# Patient Record
Sex: Female | Born: 1940
Health system: Southern US, Community
[De-identification: ages and names within clinical notes are randomized; demographics above are authoritative.]

## PROBLEM LIST (undated history)

## (undated) DIAGNOSIS — R194 Change in bowel habit: Secondary | ICD-10-CM

## (undated) DIAGNOSIS — K759 Inflammatory liver disease, unspecified: Secondary | ICD-10-CM

## (undated) DIAGNOSIS — G4730 Sleep apnea, unspecified: Secondary | ICD-10-CM

## (undated) DIAGNOSIS — C801 Malignant (primary) neoplasm, unspecified: Secondary | ICD-10-CM

## (undated) DIAGNOSIS — C50919 Malignant neoplasm of unspecified site of unspecified female breast: Secondary | ICD-10-CM

## (undated) DIAGNOSIS — I639 Cerebral infarction, unspecified: Secondary | ICD-10-CM

## (undated) DIAGNOSIS — E039 Hypothyroidism, unspecified: Secondary | ICD-10-CM

## (undated) DIAGNOSIS — M1990 Unspecified osteoarthritis, unspecified site: Secondary | ICD-10-CM

## (undated) DIAGNOSIS — Z780 Asymptomatic menopausal state: Secondary | ICD-10-CM

## (undated) DIAGNOSIS — J40 Bronchitis, not specified as acute or chronic: Secondary | ICD-10-CM

## (undated) DIAGNOSIS — S7292XA Unspecified fracture of left femur, initial encounter for closed fracture: Secondary | ICD-10-CM

## (undated) DIAGNOSIS — E785 Hyperlipidemia, unspecified: Secondary | ICD-10-CM

## (undated) DIAGNOSIS — M810 Age-related osteoporosis without current pathological fracture: Secondary | ICD-10-CM

## (undated) DIAGNOSIS — D472 Monoclonal gammopathy: Secondary | ICD-10-CM

## (undated) DIAGNOSIS — I10 Essential (primary) hypertension: Secondary | ICD-10-CM

## (undated) DIAGNOSIS — K625 Hemorrhage of anus and rectum: Secondary | ICD-10-CM

## (undated) DIAGNOSIS — E559 Vitamin D deficiency, unspecified: Secondary | ICD-10-CM

## (undated) DIAGNOSIS — K635 Polyp of colon: Secondary | ICD-10-CM

## (undated) DIAGNOSIS — J302 Other seasonal allergic rhinitis: Secondary | ICD-10-CM

## (undated) DIAGNOSIS — Z923 Personal history of irradiation: Secondary | ICD-10-CM

## (undated) HISTORY — DX: Cerebral infarction, unspecified: I63.9

## (undated) HISTORY — DX: Monoclonal gammopathy: D47.2

## (undated) HISTORY — DX: Unspecified fracture of left femur, initial encounter for closed fracture: S72.92XA

## (undated) HISTORY — PX: BREAST CYST ASPIRATION: SHX578

## (undated) HISTORY — PX: FRACTURE SURGERY: SHX138

## (undated) HISTORY — PX: EYE SURGERY: SHX253

## (undated) HISTORY — PX: OTHER SURGICAL HISTORY: SHX169

## (undated) HISTORY — PX: TONSILLECTOMY: SUR1361

## (undated) HISTORY — DX: Malignant neoplasm of unspecified site of unspecified female breast: C50.919

## (undated) HISTORY — DX: Essential (primary) hypertension: I10

## (undated) HISTORY — DX: Asymptomatic menopausal state: Z78.0

---

## 1998-03-27 ENCOUNTER — Other Ambulatory Visit: Admission: RE | Admit: 1998-03-27 | Discharge: 1998-03-27 | Payer: Self-pay | Admitting: *Deleted

## 1999-02-18 ENCOUNTER — Ambulatory Visit (HOSPITAL_COMMUNITY): Admission: RE | Admit: 1999-02-18 | Discharge: 1999-02-18 | Payer: Self-pay | Admitting: Obstetrics & Gynecology

## 1999-04-01 ENCOUNTER — Other Ambulatory Visit: Admission: RE | Admit: 1999-04-01 | Discharge: 1999-04-01 | Payer: Self-pay | Admitting: *Deleted

## 1999-06-18 ENCOUNTER — Encounter: Admission: RE | Admit: 1999-06-18 | Discharge: 1999-06-18 | Payer: Self-pay | Admitting: *Deleted

## 1999-06-18 ENCOUNTER — Encounter: Payer: Self-pay | Admitting: *Deleted

## 1999-12-29 ENCOUNTER — Encounter: Admission: RE | Admit: 1999-12-29 | Discharge: 1999-12-29 | Payer: Self-pay | Admitting: Endocrinology

## 2000-01-04 ENCOUNTER — Encounter: Admission: RE | Admit: 2000-01-04 | Discharge: 2000-01-04 | Payer: Self-pay

## 2001-02-22 ENCOUNTER — Encounter: Admission: RE | Admit: 2001-02-22 | Discharge: 2001-02-22 | Payer: Self-pay | Admitting: Endocrinology

## 2001-02-27 ENCOUNTER — Encounter: Admission: RE | Admit: 2001-02-27 | Discharge: 2001-02-27 | Payer: Self-pay | Admitting: Endocrinology

## 2002-04-10 ENCOUNTER — Encounter: Admission: RE | Admit: 2002-04-10 | Discharge: 2002-04-10 | Payer: Self-pay | Admitting: Endocrinology

## 2005-11-30 ENCOUNTER — Ambulatory Visit: Payer: Self-pay | Admitting: Family Medicine

## 2006-01-11 ENCOUNTER — Ambulatory Visit: Payer: Self-pay | Admitting: Unknown Physician Specialty

## 2007-03-20 ENCOUNTER — Ambulatory Visit: Payer: Self-pay | Admitting: Family Medicine

## 2008-03-27 ENCOUNTER — Ambulatory Visit: Payer: Self-pay | Admitting: Family Medicine

## 2008-04-02 ENCOUNTER — Ambulatory Visit: Payer: Self-pay | Admitting: Family Medicine

## 2008-09-30 HISTORY — PX: BREAST BIOPSY: SHX20

## 2009-04-02 ENCOUNTER — Ambulatory Visit: Payer: Self-pay | Admitting: General Surgery

## 2010-09-22 ENCOUNTER — Ambulatory Visit: Payer: Self-pay | Admitting: Family Medicine

## 2011-02-15 ENCOUNTER — Ambulatory Visit: Payer: Self-pay | Admitting: Unknown Physician Specialty

## 2011-09-29 ENCOUNTER — Emergency Department: Payer: Self-pay | Admitting: Emergency Medicine

## 2011-09-29 LAB — CBC WITH DIFFERENTIAL/PLATELET
Basophil #: 0 10*3/uL (ref 0.0–0.1)
Basophil %: 0 %
Eosinophil #: 0 10*3/uL (ref 0.0–0.7)
Eosinophil %: 0 %
HCT: 41.1 % (ref 35.0–47.0)
HGB: 14 g/dL (ref 12.0–16.0)
Lymphocyte #: 0.4 10*3/uL — ABNORMAL LOW (ref 1.0–3.6)
Lymphocyte %: 3.5 %
MCH: 30.4 pg (ref 26.0–34.0)
MCHC: 34 g/dL (ref 32.0–36.0)
MCV: 89 fL (ref 80–100)
Monocyte #: 0.5 10*3/uL (ref 0.0–0.7)
Monocyte %: 3.9 %
Neutrophil #: 11.6 10*3/uL — ABNORMAL HIGH (ref 1.4–6.5)
Neutrophil %: 92.6 %
Platelet: 217 10*3/uL (ref 150–440)
RBC: 4.6 10*6/uL (ref 3.80–5.20)
RDW: 13.3 % (ref 11.5–14.5)
WBC: 12.6 10*3/uL — ABNORMAL HIGH (ref 3.6–11.0)

## 2011-09-29 LAB — URINALYSIS, COMPLETE
Bilirubin,UR: NEGATIVE
Blood: NEGATIVE
Glucose,UR: NEGATIVE mg/dL (ref 0–75)
Leukocyte Esterase: NEGATIVE
Nitrite: NEGATIVE
Ph: 6 (ref 4.5–8.0)
Protein: NEGATIVE
RBC,UR: 2 /HPF (ref 0–5)
Specific Gravity: 1.024 (ref 1.003–1.030)
Squamous Epithelial: 2
WBC UR: 1 /HPF (ref 0–5)

## 2011-09-29 LAB — COMPREHENSIVE METABOLIC PANEL
Albumin: 4.1 g/dL (ref 3.4–5.0)
Alkaline Phosphatase: 76 U/L (ref 50–136)
Anion Gap: 16 (ref 7–16)
BUN: 19 mg/dL — ABNORMAL HIGH (ref 7–18)
Bilirubin,Total: 0.8 mg/dL (ref 0.2–1.0)
Calcium, Total: 10.1 mg/dL (ref 8.5–10.1)
Chloride: 100 mmol/L (ref 98–107)
Co2: 25 mmol/L (ref 21–32)
Creatinine: 0.79 mg/dL (ref 0.60–1.30)
EGFR (African American): >60
EGFR (Non-African Amer.): >60
Glucose: 172 mg/dL — ABNORMAL HIGH (ref 65–99)
Osmolality: 288 (ref 275–301)
Potassium: 3.9 mmol/L (ref 3.5–5.1)
SGOT(AST): 24 U/L (ref 15–37)
SGPT (ALT): 12 U/L
Sodium: 141 mmol/L (ref 136–145)
Total Protein: 8.5 g/dL — ABNORMAL HIGH (ref 6.4–8.2)

## 2011-10-18 ENCOUNTER — Ambulatory Visit: Payer: Self-pay | Admitting: Family Medicine

## 2012-12-25 ENCOUNTER — Ambulatory Visit: Payer: Self-pay | Admitting: Family Medicine

## 2013-09-11 ENCOUNTER — Ambulatory Visit: Payer: Self-pay

## 2014-01-21 ENCOUNTER — Ambulatory Visit: Payer: Self-pay | Admitting: Family Medicine

## 2014-09-10 DIAGNOSIS — B001 Herpesviral vesicular dermatitis: Secondary | ICD-10-CM | POA: Diagnosis not present

## 2014-09-10 DIAGNOSIS — L821 Other seborrheic keratosis: Secondary | ICD-10-CM | POA: Diagnosis not present

## 2014-09-10 DIAGNOSIS — Z1283 Encounter for screening for malignant neoplasm of skin: Secondary | ICD-10-CM | POA: Diagnosis not present

## 2014-09-10 DIAGNOSIS — L218 Other seborrheic dermatitis: Secondary | ICD-10-CM | POA: Diagnosis not present

## 2014-12-26 DIAGNOSIS — K529 Noninfective gastroenteritis and colitis, unspecified: Secondary | ICD-10-CM | POA: Diagnosis not present

## 2014-12-31 ENCOUNTER — Encounter: Payer: Self-pay | Admitting: *Deleted

## 2014-12-31 ENCOUNTER — Emergency Department
Admission: EM | Admit: 2014-12-31 | Discharge: 2014-12-31 | Disposition: A | Discharge status: Home / Self Care | Payer: Commercial Managed Care - HMO | Attending: Emergency Medicine | Admitting: Emergency Medicine

## 2014-12-31 DIAGNOSIS — K529 Noninfective gastroenteritis and colitis, unspecified: Secondary | ICD-10-CM | POA: Diagnosis not present

## 2014-12-31 DIAGNOSIS — R197 Diarrhea, unspecified: Secondary | ICD-10-CM | POA: Diagnosis present

## 2014-12-31 DIAGNOSIS — K644 Residual hemorrhoidal skin tags: Secondary | ICD-10-CM | POA: Diagnosis not present

## 2014-12-31 LAB — COMPREHENSIVE METABOLIC PANEL
ALT: 13 U/L — ABNORMAL LOW (ref 14–54)
ANION GAP: 7 (ref 5–15)
AST: 22 U/L (ref 15–41)
Albumin: 4.1 g/dL (ref 3.5–5.0)
Alkaline Phosphatase: 75 U/L (ref 38–126)
BILIRUBIN TOTAL: 0.7 mg/dL (ref 0.3–1.2)
BUN: 18 mg/dL (ref 6–20)
CALCIUM: 9.6 mg/dL (ref 8.9–10.3)
CO2: 29 mmol/L (ref 22–32)
CREATININE: 0.65 mg/dL (ref 0.44–1.00)
Chloride: 107 mmol/L (ref 101–111)
GFR calc non Af Amer: >60 mL/min (ref >60)
Glucose, Bld: 113 mg/dL — ABNORMAL HIGH (ref 65–99)
Potassium: 4 mmol/L (ref 3.5–5.1)
Sodium: 143 mmol/L (ref 135–145)
TOTAL PROTEIN: 7.8 g/dL (ref 6.5–8.1)

## 2014-12-31 LAB — CBC WITH DIFFERENTIAL/PLATELET
BASOS ABS: 0.1 10*3/uL (ref 0–0.1)
BASOS PCT: 1 %
EOS ABS: 0.1 10*3/uL (ref 0–0.7)
Eosinophils Relative: 2 %
HCT: 39.3 % (ref 35.0–47.0)
Hemoglobin: 13.2 g/dL (ref 12.0–16.0)
Lymphocytes Relative: 23 %
Lymphs Abs: 1.5 10*3/uL (ref 1.0–3.6)
MCH: 29.9 pg (ref 26.0–34.0)
MCHC: 33.6 g/dL (ref 32.0–36.0)
MCV: 89.1 fL (ref 80.0–100.0)
Monocytes Absolute: 0.5 10*3/uL (ref 0.2–0.9)
Monocytes Relative: 9 %
Neutro Abs: 4.2 10*3/uL (ref 1.4–6.5)
Neutrophils Relative %: 65 %
PLATELETS: 189 10*3/uL (ref 150–440)
RBC: 4.41 MIL/uL (ref 3.80–5.20)
RDW: 14.1 % (ref 11.5–14.5)
WBC: 6.4 10*3/uL (ref 3.6–11.0)

## 2014-12-31 LAB — LIPASE, BLOOD: Lipase: 28 U/L (ref 22–51)

## 2014-12-31 MED ORDER — DICYCLOMINE HCL 20 MG PO TABS: 20.0000 mg | ORAL_TABLET | Freq: Three times a day (TID) | ORAL | Status: DC | PRN

## 2014-12-31 MED ORDER — TUCKS 50 % EX PADS: 1.0000 "application " | MEDICATED_PAD | Freq: Two times a day (BID) | CUTANEOUS | Status: DC

## 2014-12-31 NOTE — ED Provider Notes (Signed)
[facility] Emergency Department Provider Note  ____________________________________________  Time seen: 11:10 PM  I have reviewed the triage vital signs and the nursing notes.   HISTORY  Chief Complaint Diarrhea    HPI Kristin Wise is a 74 y.o. female who complains of chronic diarrhea for about the last 9 months. She reports that this started after falling in the river during a canoe trip. It is waxing and waning, and she can go several days without having any diarrhea. The diarrhea always happens right after eating. She has not noticed any particular triggers. She does not have any vomiting or fever. No chest pain or shortness of breath. Other than some mild intermittent nausea, and some occasional dizziness which she says is not lightheadedness or presyncope, she has no other symptoms.She comes to the ED tonight because she had one episode of bloody diarrhea at home at about 7 PM today. She has not had any other bowel movements since then. It was noted to be a small amount of red blood.     History reviewed. No pertinent past medical history.  There are no active problems to display for this patient.   Past Surgical History  Procedure Laterality Date  . Tonsillectomy      Current Outpatient Rx  Name  Route  Sig  Dispense  Refill  . dicyclomine (BENTYL) 20 MG tablet   Oral   Take 1 tablet (20 mg total) by mouth 3 (three) times daily as needed for spasms.   30 tablet   0   . Witch Hazel (TUCKS) 50 % PADS   Apply externally   Apply 1 application topically 2 (two) times daily.   60 each   0     Allergies Sulfa antibiotics  No family history on file.  Social History History  Substance Use Topics  . Smoking status: Never Smoker   . Smokeless tobacco: Not on file  . Alcohol Use: No    Review of Systems  Constitutional: No fever or chills. No weight changes Eyes:No blurry vision or double vision.  ENT: No sore  throat. Cardiovascular: No chest pain. Respiratory: No dyspnea or cough. Gastrointestinal: Negative for abdominal pain, vomiting. No BRBPR or melena. Genitourinary: Negative for dysuria, urinary retention, bloody urine, or difficulty urinating. Musculoskeletal: Negative for back pain. No joint swelling or pain. Skin: Negative for rash. Neurological: Negative for headaches, focal weakness or numbness. Psychiatric:No anxiety or depression.   Endocrine:No hot/cold intolerance, changes in energy, or sleep difficulty.  10-point ROS otherwise negative.  ____________________________________________   PHYSICAL EXAM:  VITAL SIGNS: ED Triage Vitals  Enc Vitals Group     BP 12/31/14 2115 153/75 mmHg     Pulse Rate 12/31/14 2115 73     Resp 12/31/14 2220 9     Temp 12/31/14 2115 98.2 F (36.8 C)     Temp Source 12/31/14 2115 Oral     SpO2 12/31/14 2115 99 %     Weight 12/31/14 2115 150 lb (68.04 kg)     Height 12/31/14 2115 5\' 3"  (1.6 m)     Head Cir --      Peak Flow --      Pain Score 12/31/14 2116 3     Pain Loc --      Pain Edu? --      Excl. in [place]? --      Constitutional: Alert and oriented. Well appearing and in no distress. Eyes: No scleral icterus. No conjunctival pallor. PERRL.  EOMI ENT   Head: Normocephalic and atraumatic.   Nose: No congestion/rhinnorhea. No septal hematoma   Mouth/Throat: MMM, no pharyngeal erythema   Neck: No stridor. No SubQ emphysema.  Hematological/Lymphatic/Immunilogical: No cervical lymphadenopathy. Cardiovascular: RRR. Normal and symmetric distal pulses are present in all extremities. No murmurs, rubs, or gallops. Respiratory: Normal respiratory effort without tachypnea nor retractions. Breath sounds are clear and equal bilaterally. No wheezes/rales/rhonchi. Gastrointestinal: Soft and nontender. No distention. There is no CVA tenderness.  No rebound, rigidity, or guarding. Rectal exam reveals several small external hemorrhoids,  with some areas of mucosal breakdown. Genitourinary: deferred Musculoskeletal: Nontender with normal range of motion in all extremities. No joint effusions.  No lower extremity tenderness.  No edema. Neurologic:   Normal speech and language.  CN 2-10 normal. Motor grossly intact. No pronator drift.  Normal gait. No gross focal neurologic deficits are appreciated.  Skin:  Skin is warm, dry and intact. No rash noted.  No petechiae, purpura, or bullae. Psychiatric: Mood and affect are normal. Speech and behavior are normal. Patient exhibits appropriate insight and judgment.  ____________________________________________    LABS (pertinent positives/negatives)  Results for orders placed or performed during the hospital encounter of 12/31/14  CBC WITH DIFFERENTIAL  Result Value Ref Range   WBC 6.4 3.6 - 11.0 K/uL   RBC 4.41 3.80 - 5.20 MIL/uL   Hemoglobin 13.2 12.0 - 16.0 g/dL   HCT 39.3 35.0 - 47.0 %   MCV 89.1 80.0 - 100.0 fL   MCH 29.9 26.0 - 34.0 pg   MCHC 33.6 32.0 - 36.0 g/dL   RDW 14.1 11.5 - 14.5 %   Platelets 189 150 - 440 K/uL   Neutrophils Relative % 65 %   Neutro Abs 4.2 1.4 - 6.5 K/uL   Lymphocytes Relative 23 %   Lymphs Abs 1.5 1.0 - 3.6 K/uL   Monocytes Relative 9 %   Monocytes Absolute 0.5 0.2 - 0.9 K/uL   Eosinophils Relative 2 %   Eosinophils Absolute 0.1 0 - 0.7 K/uL   Basophils Relative 1 %   Basophils Absolute 0.1 0 - 0.1 K/uL  Comprehensive metabolic panel  Result Value Ref Range   Sodium 143 135 - 145 mmol/L   Potassium 4.0 3.5 - 5.1 mmol/L   Chloride 107 101 - 111 mmol/L   CO2 29 22 - 32 mmol/L   Glucose, Bld 113 (H) 65 - 99 mg/dL   BUN 18 6 - 20 mg/dL   Creatinine, Ser 0.65 0.44 - 1.00 mg/dL   Calcium 9.6 8.9 - 10.3 mg/dL   Total Protein 7.8 6.5 - 8.1 g/dL   Albumin 4.1 3.5 - 5.0 g/dL   AST 22 15 - 41 U/L   ALT 13 (L) 14 - 54 U/L   Alkaline Phosphatase 75 38 - 126 U/L   Total Bilirubin 0.7 0.3 - 1.2 mg/dL   GFR calc non Af Amer >60 >60 mL/min    GFR calc Af Amer >60 >60 mL/min   Anion gap 7 5 - 15  Lipase, blood  Result Value Ref Range   Lipase 28 22 - 51 U/L     ____________________________________________   EKG    ____________________________________________    RADIOLOGY    ____________________________________________   PROCEDURES  ____________________________________________   INITIAL IMPRESSION / ASSESSMENT AND PLAN / ED COURSE  Pertinent labs & imaging results that were available during my care of the patient were reviewed by me and considered in my medical decision making (see chart  for details).  The patient presents with chronic diarrhea, which may be related to a parasitic disease, IBD, food sensitivity. She does not appear to be having an active GI bleed at this time. I will suspicion for diverticulitis or surgical abdomen. The patient is in no acute distress, medically stable. She'll follow up with her primary care doctor who has an ongoing workup for this in process. She'll also return to her GI clinic with Dr. Vira Agar, she has she has a past.  ____________________________________________   FINAL CLINICAL IMPRESSION(S) / ED DIAGNOSES  Final diagnoses:  Chronic diarrhea of unknown origin  External hemorrhoids      Carrie Mew, MD 12/31/14 2333

## 2014-12-31 NOTE — ED Notes (Signed)
Pt unable to give urine sample at this time 

## 2014-12-31 NOTE — ED Notes (Addendum)
edp with pt for bedside eval at this time. rn at bedside for chaperone for rectal exam

## 2014-12-31 NOTE — Discharge Instructions (Signed)
Chronic Diarrhea Diarrhea is frequent loose and watery bowel movements. It can cause you to feel weak and dehydrated. Dehydration can cause you to become tired and thirsty and to have a dry mouth, decreased urination, and dark yellow urine. Diarrhea is a sign of another problem, most often an infection that will not last long. In most cases, diarrhea lasts 2-3 days. Diarrhea that lasts longer than 4 weeks is called long-lasting (chronic) diarrhea. It is important to treat your diarrhea as directed by your health care provider to lessen or prevent future episodes of diarrhea.  CAUSES  There are many causes of chronic diarrhea. The following are some possible causes:   Gastrointestinal infections caused by viruses, bacteria, or parasites.   Food poisoning or food allergies.   Certain medicines, such as antibiotics, chemotherapy, and laxatives.   Artificial sweeteners and fructose.   Digestive disorders, such as celiac disease and inflammatory bowel diseases.   Irritable bowel syndrome.  Some disorders of the pancreas.  Disorders of the thyroid.  Reduced blood flow to the intestines.  Cancer. Sometimes the cause of chronic diarrhea is unknown. RISK FACTORS  Having a severely weakened immune system, such as from HIV or AIDS.   Taking certain types of cancer-fighting drugs (such as with chemotherapy) or other medicines.   Having had a recent organ transplant.   Having a portion of the stomach or small bowel removed.   Traveling to countries where food and water supplies are often contaminated.  SYMPTOMS  In addition to frequent, loose stools, diarrhea may cause:   Cramping.   Abdominal pain.   Nausea.   Fever.  Fatigue.  Urgent need to use the bathroom.  Loss of bowel control. DIAGNOSIS  Your health care provider must take a careful history and perform a physical exam. Tests given are based on your symptoms and history. Tests may include:   Blood or  stool tests. Three or more stool samples may be examined. Stool cultures may be used to test for bacteria or parasites.   X-rays.   A procedure in which a thin tube is inserted into the mouth or rectum (endoscopy). This allows the health care provider to look inside the intestine.  TREATMENT   Treatment is aimed at correcting the cause of the diarrhea when possible.  Diarrhea caused by an infection can often be treated with antibiotic medicines.  Diarrhea not caused by an infection may require you to take long-term medicine or have surgery. Specific treatment should be discussed with your health care provider.  If the cause cannot be determined, treatment aims to relieve symptoms and prevent dehydration. Serious health problems can occur if you do not maintain proper fluid levels. Treatment may include:  Taking an oral rehydration solution (ORS).  Not drinking beverages that contain caffeine (such as tea, coffee, and soft drinks).  Not drinking alcohol.  Maintaining well-balanced nutrition to help you recover faster. HOME CARE INSTRUCTIONS   Drink enough fluids to keep urine clear or pale yellow. Drink 1 cup (8 oz) of fluid for each diarrhea episode. Avoid fluids that contain simple sugars, fruit juices, whole milk products, and sodas. Hydrate with an ORS. You may purchase the ORS or prepare it at home by mixing the following ingredients together:   - tsp (1.7-3  mL) table salt.   tsp (3  mL) baking soda.   tsp (1.7 mL) salt substitute containing potassium chloride.  1 tbsp (20 mL) sugar.  4.2 c (1 L) of water.  Certain foods and beverages may increase the speed at which food moves through the gastrointestinal (GI) tract. These foods and beverages should be avoided. They include:  Caffeinated and alcoholic beverages.  High-fiber foods, such as raw fruits and vegetables, nuts, seeds, and whole grain breads and cereals.  Foods and beverages sweetened with sugar  alcohols, such as xylitol, sorbitol, and mannitol.   Some foods may be well tolerated and may help thicken stool. These include:  Starchy foods, such as rice, toast, pasta, low-sugar cereal, oatmeal, grits, baked potatoes, crackers, and bagels.  Bananas.  Applesauce.  Add probiotic-rich foods to help increase healthy bacteria in the GI tract. These include yogurt and fermented milk products.  Wash your hands well after each diarrhea episode.  Only take over-the-counter or prescription medicines as directed by your health care provider.  Take a warm bath to relieve any burning or pain from frequent diarrhea episodes. SEEK MEDICAL CARE IF:   You are not urinating as often.  Your urine is a dark color.  You become very tired or dizzy.  You have severe pain in the abdomen or rectum.  Your have blood or pus in your stools.  Your stools look black and tarry. SEEK IMMEDIATE MEDICAL CARE IF:   You are unable to keep fluids down.  You have persistent vomiting.  You have blood in your stool.  Your stools are black and tarry.  You do not urinate in 6-8 hours, or there is only a small amount of very dark urine.  You have abdominal pain that increases or localizes.  You have weakness, dizziness, confusion, or lightheadedness.  You have a severe headache.  Your diarrhea gets worse or does not get better.  You have a fever or persistent symptoms for more than 2-3 days.  You have a fever and your symptoms suddenly get worse. MAKE SURE YOU:   Understand these instructions.  Will watch your condition.  Will get help right away if you are not doing well or get worse. Document Released: 11/05/2003 Document Revised: 08/20/2013 Document Reviewed: 02/07/2013 [facility] Patient Information 2015 [place], [place]. This information is not intended to replace advice given to you by your health care provider. Make sure you discuss any questions you have with your health care  provider.  Hemorrhoids Hemorrhoids are swollen veins around the rectum or anus. There are two types of hemorrhoids:   Internal hemorrhoids. These occur in the veins just inside the rectum. They may poke through to the outside and become irritated and painful.  External hemorrhoids. These occur in the veins outside the anus and can be felt as a painful swelling or hard lump near the anus. CAUSES  Pregnancy.   Obesity.   Constipation or diarrhea.   Straining to have a bowel movement.   Sitting for long periods on the toilet.  Heavy lifting or other activity that caused you to strain.  Anal intercourse. SYMPTOMS   Pain.   Anal itching or irritation.   Rectal bleeding.   Fecal leakage.   Anal swelling.   One or more lumps around the anus.  DIAGNOSIS  Your caregiver may be able to diagnose hemorrhoids by visual examination. Other examinations or tests that may be performed include:   Examination of the rectal area with a gloved hand (digital rectal exam).   Examination of anal canal using a small tube (scope).   A blood test if you have lost a significant amount of blood.  A test to look inside the  colon (sigmoidoscopy or colonoscopy). TREATMENT Most hemorrhoids can be treated at home. However, if symptoms do not seem to be getting better or if you have a lot of rectal bleeding, your caregiver may perform a procedure to help make the hemorrhoids get smaller or remove them completely. Possible treatments include:   Placing a rubber band at the base of the hemorrhoid to cut off the circulation (rubber band ligation).   Injecting a chemical to shrink the hemorrhoid (sclerotherapy).   Using a tool to burn the hemorrhoid (infrared light therapy).   Surgically removing the hemorrhoid (hemorrhoidectomy).   Stapling the hemorrhoid to block blood flow to the tissue (hemorrhoid stapling).  HOME CARE INSTRUCTIONS   Eat foods with fiber, such as whole  grains, beans, nuts, fruits, and vegetables. Ask your doctor about taking products with added fiber in them (fibersupplements).  Increase fluid intake. Drink enough water and fluids to keep your urine clear or pale yellow.   Exercise regularly.   Go to the bathroom when you have the urge to have a bowel movement. Do not wait.   Avoid straining to have bowel movements.   Keep the anal area dry and clean. Use wet toilet paper or moist towelettes after a bowel movement.   Medicated creams and suppositories may be used or applied as directed.   Only take over-the-counter or prescription medicines as directed by your caregiver.   Take warm sitz baths for 15-20 minutes, 3-4 times a day to ease pain and discomfort.   Place ice packs on the hemorrhoids if they are tender and swollen. Using ice packs between sitz baths may be helpful.   Put ice in a plastic bag.   Place a towel between your skin and the bag.   Leave the ice on for 15-20 minutes, 3-4 times a day.   Do not use a donut-shaped pillow or sit on the toilet for long periods. This increases blood pooling and pain.  SEEK MEDICAL CARE IF:  You have increasing pain and swelling that is not controlled by treatment or medicine.  You have uncontrolled bleeding.  You have difficulty or you are unable to have a bowel movement.  You have pain or inflammation outside the area of the hemorrhoids. MAKE SURE YOU:  Understand these instructions.  Will watch your condition.  Will get help right away if you are not doing well or get worse. Document Released: 08/12/2000 Document Revised: 08/01/2012 Document Reviewed: 06/19/2012 [facility] Patient Information 2015 [place], [place]. This information is not intended to replace advice given to you by your health care provider. Make sure you discuss any questions you have with your health care provider.

## 2014-12-31 NOTE — ED Notes (Signed)
Pt has had diarrhea for about a year has seen pmd and is waiting for stool culture results, today she noted blood in stool.

## 2015-01-01 DIAGNOSIS — E785 Hyperlipidemia, unspecified: Secondary | ICD-10-CM | POA: Diagnosis not present

## 2015-01-01 DIAGNOSIS — E559 Vitamin D deficiency, unspecified: Secondary | ICD-10-CM | POA: Diagnosis not present

## 2015-01-01 DIAGNOSIS — D802 Selective deficiency of immunoglobulin A [IgA]: Secondary | ICD-10-CM | POA: Diagnosis not present

## 2015-01-01 DIAGNOSIS — K529 Noninfective gastroenteritis and colitis, unspecified: Secondary | ICD-10-CM | POA: Diagnosis not present

## 2015-01-06 DIAGNOSIS — K529 Noninfective gastroenteritis and colitis, unspecified: Secondary | ICD-10-CM | POA: Diagnosis not present

## 2015-01-06 DIAGNOSIS — E559 Vitamin D deficiency, unspecified: Secondary | ICD-10-CM | POA: Diagnosis not present

## 2015-01-06 DIAGNOSIS — D802 Selective deficiency of immunoglobulin A [IgA]: Secondary | ICD-10-CM | POA: Diagnosis not present

## 2015-01-06 DIAGNOSIS — E785 Hyperlipidemia, unspecified: Secondary | ICD-10-CM | POA: Diagnosis not present

## 2015-01-07 DIAGNOSIS — R197 Diarrhea, unspecified: Secondary | ICD-10-CM | POA: Diagnosis not present

## 2015-01-12 DIAGNOSIS — R194 Change in bowel habit: Secondary | ICD-10-CM | POA: Diagnosis not present

## 2015-01-12 DIAGNOSIS — Z8371 Family history of colonic polyps: Secondary | ICD-10-CM | POA: Insufficient documentation

## 2015-01-12 DIAGNOSIS — R768 Other specified abnormal immunological findings in serum: Secondary | ICD-10-CM | POA: Diagnosis not present

## 2015-01-12 DIAGNOSIS — K625 Hemorrhage of anus and rectum: Secondary | ICD-10-CM | POA: Insufficient documentation

## 2015-01-12 DIAGNOSIS — R198 Other specified symptoms and signs involving the digestive system and abdomen: Secondary | ICD-10-CM | POA: Insufficient documentation

## 2015-01-13 DIAGNOSIS — R194 Change in bowel habit: Secondary | ICD-10-CM | POA: Diagnosis not present

## 2015-01-16 DIAGNOSIS — R768 Other specified abnormal immunological findings in serum: Secondary | ICD-10-CM | POA: Diagnosis not present

## 2015-01-19 ENCOUNTER — Other Ambulatory Visit: Payer: Self-pay | Admitting: Family Medicine

## 2015-01-19 DIAGNOSIS — Z1231 Encounter for screening mammogram for malignant neoplasm of breast: Secondary | ICD-10-CM

## 2015-01-30 ENCOUNTER — Ambulatory Visit
Admission: RE | Admit: 2015-01-30 | Discharge: 2015-01-30 | Disposition: A | Discharge status: Home / Self Care | Payer: Commercial Managed Care - HMO | Source: Ambulatory Visit | Attending: Family Medicine | Admitting: Family Medicine

## 2015-01-30 ENCOUNTER — Other Ambulatory Visit: Payer: Self-pay | Admitting: Family Medicine

## 2015-01-30 DIAGNOSIS — Z1231 Encounter for screening mammogram for malignant neoplasm of breast: Secondary | ICD-10-CM

## 2015-02-03 ENCOUNTER — Encounter: Payer: Self-pay | Admitting: *Deleted

## 2015-02-05 ENCOUNTER — Encounter
Admission: RE | Disposition: A | Discharge status: Home / Self Care | Payer: Self-pay | Source: Ambulatory Visit | Attending: Unknown Physician Specialty

## 2015-02-05 ENCOUNTER — Ambulatory Visit: Payer: Commercial Managed Care - HMO | Admitting: Anesthesiology

## 2015-02-05 ENCOUNTER — Ambulatory Visit
Admission: RE | Admit: 2015-02-05 | Discharge: 2015-02-05 | Disposition: A | Discharge status: Home / Self Care | Payer: Commercial Managed Care - HMO | Source: Ambulatory Visit | Attending: Unknown Physician Specialty | Admitting: Unknown Physician Specialty

## 2015-02-05 DIAGNOSIS — K648 Other hemorrhoids: Secondary | ICD-10-CM | POA: Diagnosis not present

## 2015-02-05 DIAGNOSIS — R197 Diarrhea, unspecified: Secondary | ICD-10-CM | POA: Diagnosis not present

## 2015-02-05 DIAGNOSIS — D122 Benign neoplasm of ascending colon: Secondary | ICD-10-CM | POA: Insufficient documentation

## 2015-02-05 DIAGNOSIS — Z79899 Other long term (current) drug therapy: Secondary | ICD-10-CM | POA: Diagnosis not present

## 2015-02-05 DIAGNOSIS — E785 Hyperlipidemia, unspecified: Secondary | ICD-10-CM | POA: Insufficient documentation

## 2015-02-05 DIAGNOSIS — E559 Vitamin D deficiency, unspecified: Secondary | ICD-10-CM | POA: Insufficient documentation

## 2015-02-05 DIAGNOSIS — K640 First degree hemorrhoids: Secondary | ICD-10-CM | POA: Diagnosis not present

## 2015-02-05 DIAGNOSIS — K5730 Diverticulosis of large intestine without perforation or abscess without bleeding: Secondary | ICD-10-CM | POA: Insufficient documentation

## 2015-02-05 DIAGNOSIS — M810 Age-related osteoporosis without current pathological fracture: Secondary | ICD-10-CM | POA: Diagnosis not present

## 2015-02-05 DIAGNOSIS — K649 Unspecified hemorrhoids: Secondary | ICD-10-CM | POA: Diagnosis not present

## 2015-02-05 DIAGNOSIS — K625 Hemorrhage of anus and rectum: Secondary | ICD-10-CM | POA: Insufficient documentation

## 2015-02-05 DIAGNOSIS — Z791 Long term (current) use of non-steroidal anti-inflammatories (NSAID): Secondary | ICD-10-CM | POA: Diagnosis not present

## 2015-02-05 DIAGNOSIS — Z86010 Personal history of colonic polyps: Secondary | ICD-10-CM | POA: Insufficient documentation

## 2015-02-05 DIAGNOSIS — K635 Polyp of colon: Secondary | ICD-10-CM | POA: Diagnosis not present

## 2015-02-05 DIAGNOSIS — R194 Change in bowel habit: Secondary | ICD-10-CM | POA: Diagnosis not present

## 2015-02-05 HISTORY — DX: Change in bowel habit: R19.4

## 2015-02-05 HISTORY — DX: Hyperlipidemia, unspecified: E78.5

## 2015-02-05 HISTORY — DX: Other seasonal allergic rhinitis: J30.2

## 2015-02-05 HISTORY — DX: Vitamin D deficiency, unspecified: E55.9

## 2015-02-05 HISTORY — DX: Polyp of colon: K63.5

## 2015-02-05 HISTORY — DX: Age-related osteoporosis without current pathological fracture: M81.0

## 2015-02-05 HISTORY — PX: COLONOSCOPY WITH PROPOFOL: SHX5780

## 2015-02-05 HISTORY — DX: Hemorrhage of anus and rectum: K62.5

## 2015-02-05 SURGERY — COLONOSCOPY WITH PROPOFOL
Anesthesia: General

## 2015-02-05 MED ORDER — PROPOFOL 10 MG/ML IV BOLUS
INTRAVENOUS | Status: DC | PRN
  Administered 2015-02-05: 40 mg via INTRAVENOUS
  Administered 2015-02-05: 20 mg via INTRAVENOUS

## 2015-02-05 MED ORDER — EPHEDRINE SULFATE 50 MG/ML IJ SOLN
INTRAMUSCULAR | Status: DC | PRN
Start: 2015-02-05 — End: 2015-02-05
  Administered 2015-02-05 (×2): 10 mg via INTRAVENOUS
  Administered 2015-02-05: 7.5 mg via INTRAVENOUS

## 2015-02-05 MED ORDER — MIDAZOLAM HCL 5 MG/5ML IJ SOLN
INTRAMUSCULAR | Status: DC | PRN
  Administered 2015-02-05: 1 mg via INTRAVENOUS

## 2015-02-05 MED ORDER — LACTATED RINGERS IV SOLN
INTRAVENOUS | Status: DC
  Administered 2015-02-05: 12:00:00 via INTRAVENOUS

## 2015-02-05 MED ORDER — PROPOFOL INFUSION 10 MG/ML OPTIME
INTRAVENOUS | Status: DC | PRN
  Administered 2015-02-05: 140 ug/kg/min via INTRAVENOUS

## 2015-02-05 MED ORDER — SODIUM CHLORIDE 0.9 % IV SOLN: INTRAVENOUS | Status: DC

## 2015-02-05 MED ORDER — FENTANYL CITRATE (PF) 100 MCG/2ML IJ SOLN
INTRAMUSCULAR | Status: DC | PRN
  Administered 2015-02-05: 50 ug via INTRAVENOUS

## 2015-02-05 NOTE — Transfer of Care (Signed)
Immediate Anesthesia Transfer of Care Note  Patient: Kristin Wise  Procedure(s) Performed: Procedure(s): COLONOSCOPY WITH PROPOFOL (N/A)  Patient Location: PACU  Anesthesia Type:General  Level of Consciousness: awake, alert  and oriented  Airway & Oxygen Therapy: Patient Spontanous Breathing and Patient connected to nasal cannula oxygen  Post-op Assessment: Report given to RN and Post -op Vital signs reviewed and stable  Post vital signs: Reviewed and stable  Last Vitals:  Filed Vitals:   02/05/15 1005  BP: 117/85  Pulse: 72  Temp: 36.5 C  Resp: 18    Complications: No apparent anesthesia complications

## 2015-02-05 NOTE — H&P (Signed)
   Primary Care Physician:  Maryland Pink, MD Primary Gastroenterologist:  Dr. Vira Agar  Pre-Procedure History & Physical: HPI:  Kristin Wise is a 74 y.o. female is here for an colonoscopy.   Past Medical History  Diagnosis Date  . Change in bowel habits   . Rectal bleeding   . Colon polyps   . Osteoporosis   . Hyperlipidemia   . Vitamin D deficiency   . Seasonal allergies     Past Surgical History  Procedure Laterality Date  . Tonsillectomy    . Breast biopsy Left 2009    negative    Prior to Admission medications   Medication Sig Start Date End Date Taking? Authorizing Provider  Calcium Carbonate-Vitamin D 600-400 MG-UNIT per tablet Take 2 tablets by mouth daily.   Yes Historical Provider, MD  diphenhydrAMINE (BENADRYL) 2 % cream Apply 1 application topically 3 (three) times daily as needed for itching.   Yes Historical Provider, MD  ibuprofen (ADVIL,MOTRIN) 200 MG tablet Take 400 mg by mouth daily as needed for moderate pain.    Yes Historical Provider, MD  L-Lysine 500 MG TABS Take 2 tablets by mouth 4 (four) times daily as needed (mouth sores).    Yes Historical Provider, MD  Vitamin D, Ergocalciferol, (DRISDOL) 50000 UNITS CAPS capsule Take 1 capsule by mouth once a week.   Yes Historical Provider, MD  Witch Hazel (TUCKS) 50 % PADS Apply 1 application topically 2 (two) times daily. 12/31/14  Yes Carrie Mew, MD  dicyclomine (BENTYL) 20 MG tablet Take 1 tablet (20 mg total) by mouth 3 (three) times daily as needed for spasms. Patient not taking: Reported on 01/15/2015 12/31/14   Carrie Mew, MD    Allergies as of 01/13/2015 - Review Complete 12/31/2014  Allergen Reaction Noted  . Sulfa antibiotics Hives 12/31/2014    Family History  Problem Relation Age of Onset  . Breast cancer Cousin     History   Social History  . Marital Status: Married    Spouse Name: N/A  . Number of Children: N/A  . Years of Education: N/A   Occupational History  . Not  on file.   Social History Main Topics  . Smoking status: Never Smoker   . Smokeless tobacco: Not on file  . Alcohol Use: No  . Drug Use: No  . Sexual Activity: Not on file   Other Topics Concern  . Not on file   Social History Narrative    Review of Systems: See HPI, otherwise negative ROS  Physical Exam: BP 117/85 mmHg  Pulse 72  Temp(Src) 97.7 F (36.5 C) (Tympanic)  Resp 18  Ht 5\' 3"  (1.6 m)  Wt 72.576 kg (160 lb)  BMI 28.35 kg/m2  SpO2 100% General:   Alert,  pleasant and cooperative in NAD Head:  Normocephalic and atraumatic. Neck:  Supple; no masses or thyromegaly. Lungs:  Clear throughout to auscultation.    Heart:  Regular rate and rhythm. Abdomen:  Soft, nontender and nondistended. Normal bowel sounds, without guarding, and without rebound.   Neurologic:  Alert and  oriented x4;  grossly normal neurologically.  Impression/Plan: Kristin Wise is here for an colonoscopy to be performed for BRBPR and a change in bowel habits  Risks, benefits, limitations, and alternatives regarding  colonoscopy have been reviewed with the patient.  Questions have been answered.  All parties agreeable.   Gaylyn Cheers, MD  02/05/2015, 11:42 AM

## 2015-02-05 NOTE — Anesthesia Procedure Notes (Signed)
Date/Time: 02/05/2015 11:52 AM Performed by: Kennon Holter Pre-anesthesia Checklist: Patient identified, Emergency Drugs available, Suction available, Patient being monitored and Timeout performed Patient Re-evaluated:Patient Re-evaluated prior to inductionOxygen Delivery Method: Nasal cannula Intubation Type: IV induction

## 2015-02-05 NOTE — H&P (Signed)
   Primary Care Physician:  Maryland Pink, MD Primary Gastroenterologist:  Dr. Vira Agar  Pre-Procedure History & Physical: HPI:  Kristin Wise is a 74 y.o. female is here for an endoscopy and colonoscopy.   Past Medical History  Diagnosis Date  . Change in bowel habits   . Rectal bleeding   . Colon polyps   . Osteoporosis   . Hyperlipidemia   . Vitamin D deficiency   . Seasonal allergies     Past Surgical History  Procedure Laterality Date  . Tonsillectomy    . Breast biopsy Left 2009    negative    Prior to Admission medications   Medication Sig Start Date End Date Taking? Authorizing Provider  Calcium Carbonate-Vitamin D 600-400 MG-UNIT per tablet Take 2 tablets by mouth daily.   Yes Historical Provider, MD  diphenhydrAMINE (BENADRYL) 2 % cream Apply 1 application topically 3 (three) times daily as needed for itching.   Yes Historical Provider, MD  ibuprofen (ADVIL,MOTRIN) 200 MG tablet Take 400 mg by mouth daily as needed for moderate pain.    Yes Historical Provider, MD  L-Lysine 500 MG TABS Take 2 tablets by mouth 4 (four) times daily as needed (mouth sores).    Yes Historical Provider, MD  Vitamin D, Ergocalciferol, (DRISDOL) 50000 UNITS CAPS capsule Take 1 capsule by mouth once a week.   Yes Historical Provider, MD  Witch Hazel (TUCKS) 50 % PADS Apply 1 application topically 2 (two) times daily. 12/31/14  Yes Carrie Mew, MD  dicyclomine (BENTYL) 20 MG tablet Take 1 tablet (20 mg total) by mouth 3 (three) times daily as needed for spasms. Patient not taking: Reported on 01/15/2015 12/31/14   Carrie Mew, MD    Allergies as of 01/13/2015 - Review Complete 12/31/2014  Allergen Reaction Noted  . Sulfa antibiotics Hives 12/31/2014    Family History  Problem Relation Age of Onset  . Breast cancer Cousin     History   Social History  . Marital Status: Married    Spouse Name: N/A  . Number of Children: N/A  . Years of Education: N/A   Occupational  History  . Not on file.   Social History Main Topics  . Smoking status: Never Smoker   . Smokeless tobacco: Not on file  . Alcohol Use: No  . Drug Use: No  . Sexual Activity: Not on file   Other Topics Concern  . Not on file   Social History Narrative    Review of Systems: See HPI, otherwise negative ROS  Physical Exam: BP 117/85 mmHg  Pulse 72  Temp(Src) 97.7 F (36.5 C) (Tympanic)  Resp 18  Ht 5\' 3"  (1.6 m)  Wt 72.576 kg (160 lb)  BMI 28.35 kg/m2  SpO2 100% General:   Alert,  pleasant and cooperative in NAD Head:  Normocephalic and atraumatic. Neck:  Supple; no masses or thyromegaly. Lungs:  Clear throughout to auscultation.    Heart:  Regular rate and rhythm. Abdomen:  Soft, nontender and nondistended. Normal bowel sounds, without guarding, and without rebound.   Neurologic:  Alert and  oriented x4;  grossly normal neurologically.  Impression/Plan: AMENAH TUCCI is here for an endoscopy and colonoscopy to be performed for personal history of colon polyps and dysphagia and GERD  Risks, benefits, limitations, and alternatives regarding  endoscopy and colonoscopy have been reviewed with the patient.  Questions have been answered.  All parties agreeable.   Gaylyn Cheers, MD  02/05/2015, 10:43 AM

## 2015-02-05 NOTE — Anesthesia Postprocedure Evaluation (Signed)
  Anesthesia Post-op Note  Patient: Kristin Wise  Procedure(s) Performed: Procedure(s): COLONOSCOPY WITH PROPOFOL (N/A)  Anesthesia type:General  Patient location: PACU  Post pain: Pain level controlled  Post assessment: Post-op Vital signs reviewed, Patient's Cardiovascular Status Stable, Respiratory Function Stable, Patent Airway and No signs of Nausea or vomiting  Post vital signs: Reviewed and stable  Last Vitals:  Filed Vitals:   02/05/15 1240  BP: 134/73  Pulse: 76  Temp:   Resp: 13    Level of consciousness: awake, alert  and patient cooperative  Complications: No apparent anesthesia complications

## 2015-02-05 NOTE — Op Note (Signed)
[facility] Gastroenterology Patient Name: Kristin Wise Procedure Date: 02/05/2015 11:38 AM MRN: [id number] Account #: [id number] Date of Birth: Sep 08, 1940 Admit Type: Outpatient Age: 74 Room: [facility] ENDO ROOM 1 Gender: Female Note Status: Finalized Procedure:         Colonoscopy Indications:       Rectal bleeding, Change in bowel habits Providers:         Manya Silvas, MD Referring MD:      Irven Easterly. Kary Kos, MD (Referring MD) Medicines:         Propofol per Anesthesia Complications:     No immediate complications. Procedure:         Pre-Anesthesia Assessment:                    - After reviewing the risks and benefits, the patient was                     deemed in satisfactory condition to undergo the procedure.                    After obtaining informed consent, the colonoscope was                     passed under direct vision. Throughout the procedure, the                     patient's blood pressure, pulse, and oxygen saturations                     were monitored continuously. The Colonoscope was                     introduced through the anus and advanced to the the cecum,                     identified by appendiceal orifice and ileocecal valve. The                     colonoscopy was somewhat difficult due to restricted                     mobility of the colon. Successful completion of the                     procedure was aided by applying abdominal pressure. The                     patient tolerated the procedure well. The quality of the                     bowel preparation was excellent. Findings:      Multiple medium-mouthed diverticula were found in the sigmoid colon.      Internal hemorrhoids were found during endoscopy. The hemorrhoids were       small-medium-sized and Grade I (internal hemorrhoids that do not       prolapse).      A small polyp was found in the proximal ascending colon. The polyp was       sessile. The polyp was  removed with a cold snare. Resection and       retrieval were complete.      Due to spordic diarrhea I biopsied the colon lining in ascending and       sigmoid colon. Impression:        -  Diverticulosis in the sigmoid colon.                    - Internal hemorrhoids.                    - One small polyp in the proximal ascending colon.                     Resected and retrieved. Recommendation:    - Await pathology results. Manya Silvas, MD 02/05/2015 12:17:48 PM This report has been signed electronically. Number of Addenda: 0 Note Initiated On: 02/05/2015 11:38 AM Scope Withdrawal Time: 0 hours 10 minutes 45 seconds  Total Procedure Duration: 0 hours 22 minutes 23 seconds       [facility]

## 2015-02-05 NOTE — Anesthesia Preprocedure Evaluation (Signed)
Anesthesia Evaluation  Patient identified by MRN, date of birth, ID band Patient awake    Reviewed: Allergy & Precautions, NPO status , Patient's Chart, lab work & pertinent test results  History of Anesthesia Complications Negative for: history of anesthetic complications  Airway Mallampati: II  TM Distance: >3 FB Neck ROM: Full    Dental no notable dental hx.    Pulmonary neg pulmonary ROS,  breath sounds clear to auscultation  Pulmonary exam normal       Cardiovascular Exercise Tolerance: Good negative cardio ROS Normal cardiovascular examRhythm:Regular Rate:Normal     Neuro/Psych negative neurological ROS  negative psych ROS   GI/Hepatic negative GI ROS, Neg liver ROS,   Endo/Other  negative endocrine ROS  Renal/GU negative Renal ROS  negative genitourinary   Musculoskeletal negative musculoskeletal ROS (+)   Abdominal   Peds negative pediatric ROS (+)  Hematology negative hematology ROS (+)   Anesthesia Other Findings   Reproductive/Obstetrics negative OB ROS                             Anesthesia Physical Anesthesia Plan  ASA: II  Anesthesia Plan: General   Post-op Pain Management:    Induction: Intravenous  Airway Management Planned: Nasal Cannula  Additional Equipment:   Intra-op Plan:   Post-operative Plan:   Informed Consent: I have reviewed the patients History and Physical, chart, labs and discussed the procedure including the risks, benefits and alternatives for the proposed anesthesia with the patient or authorized representative who has indicated his/her understanding and acceptance.     Plan Discussed with: CRNA and Surgeon  Anesthesia Plan Comments:         Anesthesia Quick Evaluation

## 2015-02-06 ENCOUNTER — Encounter: Payer: Self-pay | Admitting: Unknown Physician Specialty

## 2015-02-06 LAB — SURGICAL PATHOLOGY

## 2015-02-27 DIAGNOSIS — D802 Selective deficiency of immunoglobulin A [IgA]: Secondary | ICD-10-CM | POA: Diagnosis not present

## 2015-02-27 DIAGNOSIS — D804 Selective deficiency of immunoglobulin M [IgM]: Secondary | ICD-10-CM | POA: Diagnosis not present

## 2015-03-13 ENCOUNTER — Encounter: Payer: Self-pay | Admitting: Internal Medicine

## 2015-03-13 ENCOUNTER — Inpatient Hospital Stay: Payer: Commercial Managed Care - HMO | Attending: Internal Medicine | Admitting: Internal Medicine

## 2015-03-13 ENCOUNTER — Inpatient Hospital Stay: Payer: Commercial Managed Care - HMO

## 2015-03-13 VITALS — BP 135/78 | HR 68 | Temp 97.8°F | Resp 18 | Ht 63.0 in | Wt 166.4 lb

## 2015-03-13 DIAGNOSIS — Z79899 Other long term (current) drug therapy: Secondary | ICD-10-CM | POA: Diagnosis not present

## 2015-03-13 DIAGNOSIS — E559 Vitamin D deficiency, unspecified: Secondary | ICD-10-CM | POA: Diagnosis not present

## 2015-03-13 DIAGNOSIS — D801 Nonfamilial hypogammaglobulinemia: Secondary | ICD-10-CM

## 2015-03-16 ENCOUNTER — Encounter: Payer: Self-pay | Admitting: Internal Medicine

## 2015-03-16 DIAGNOSIS — R768 Other specified abnormal immunological findings in serum: Secondary | ICD-10-CM | POA: Diagnosis not present

## 2015-03-16 DIAGNOSIS — D801 Nonfamilial hypogammaglobulinemia: Secondary | ICD-10-CM | POA: Diagnosis not present

## 2015-03-16 NOTE — Progress Notes (Signed)
[facility]  Telephone:(336) [phone] Fax:(336) [phone]     ID: Kristin Wise OB: [birth year]  MR#: [id number]  JDB#:[id number]  [name], [name]  CHIEF COMPLAINT/DIAGNOSIS:  Hypogammaglobulinemia IgG and IgM (levels 493 and 14 respectively on 01/16/2015), elevated serum IgA (1604 on 01/16/2015).  -  Patient referred here for hematology evaluation.   HISTORY OF PRESENT ILLNESS:  Kristin Wise is a 74 year old female with past medical history as described below. Patient more recently has had GI issues and underwent workup for celiac disease. During workup she had serum immunoglobulins drawn, IgA level was elevated at 1604 (repeated twice, April 1645 on May 17), serum IgG was low at 493 and serum IgM was low at 14, IgE was normal at 6. Patient clinically states that she has chronic fatigability. She denies any fevers or night sweats. She denies any recurrent major infections or mouth sores. Appetite is good, no unintentional weight loss. She has chronic pain issues, denies new bone pains. No new paresthesias in extremities.  REVIEW OF SYSTEMS:   ROS CONSTITUTIONAL: As in HPI above. No chills, fever or sweats.    ENT:  No headache, dizziness or epistaxis. No ear or jaw pain. Intermittent sinus drainage. RESPIRATORY: Cough and dyspnea on exertion, and his history of allergies/asthma. No wheezing. No hemoptysis. CARDIAC:  No palpitations.  No retrosternal chest pain. No orthopnea, PND. GI:  No abdominal pain, nausea or vomiting. No diarrhea. History of hemorrhoids.   GU:  No dysuria or hematuria.  SKIN: No rashes or pruritus. HEMATOLOGIC: denies bleeding symptoms MUSCULOSKELETAL: Chronic joint pains, osteoporosis  EXTREMITY:  No new swelling or pain.  NEURO:  No focal weakness. No numbness or tingling of extremities.  No seizures.   ENDOCRINE:  No polyuria or polydipsia.   PAST MEDICAL HISTORY: Reviewed. Past  Medical History  Diagnosis Date  . Change in bowel habits   . Rectal bleeding   . Colon polyps   . Osteoporosis   . Hyperlipidemia   . Vitamin D deficiency   . Seasonal allergies   . Postmenopausal     PAST SURGICAL HISTORY: Reviewed. Past Surgical History  Procedure Laterality Date  . Tonsillectomy    . Breast biopsy Left 2009    negative  . Colonoscopy with propofol N/A 02/05/2015    Procedure: COLONOSCOPY WITH PROPOFOL;  Surgeon: Manya Silvas, MD;  Location: [facility] ENDOSCOPY;  Service: Endoscopy;  Laterality: N/A;    FAMILY HISTORY: Reviewed. Family History  Problem Relation Age of Onset  . Breast cancer Cousin     paternal  . Kidney cancer Cousin     maternal  . Leukemia Cousin     maternal  . Stomach cancer      uncle  . Prostate cancer      grandfather  . Skin cancer      maternal grandparents    SOCIAL HISTORY: Reviewed. History  Substance Use Topics  . Smoking status: Never Smoker   . Smokeless tobacco: Not on file  . Alcohol Use: No    Allergies  Allergen Reactions  . Codeine     "Feels bad"  . Indomethacin     "Bad dreams"  . Mold Extract [Trichophyton]   . Morphine Other (See Comments)    Bad feeling  . Other     Sodium Laurel Sulfate- caused gums to peel, corners of mouth became sore  . Sulfa Antibiotics Hives    Feels terrible  Current Outpatient Prescriptions  Medication Sig Dispense Refill  . ibuprofen (ADVIL,MOTRIN) 200 MG tablet Take 1 tablet by mouth as needed.    Marland Kitchen L-Lysine 500 MG TABS Take 2 tablets by mouth 4 (four) times daily as needed (mouth sores).     . Vitamin D, Ergocalciferol, (DRISDOL) 50000 UNITS CAPS capsule Take 1 capsule by mouth once a week.     No current facility-administered medications for this visit.    PHYSICAL EXAM: Filed Vitals:   03/13/15 1506  BP: 135/78  Pulse: 68  Temp: 97.8 F (36.6 C)  Resp: 18     Body mass index is 29.49 kg/(m^2).     GENERAL: Patient is alert and oriented and in no  acute distress. There is no icterus. HEENT: EOMs intact. Oral exam negative for thrush or lesions. No cervical lymphadenopathy. CVS: S1S2, regular LUNGS: Bilaterally clear to auscultation, no rhonchi. ABDOMEN: Soft, nontender. No hepatosplenomegaly clinically.  NEURO: grossly nonfocal, cranial nerves are intact.   EXTREMITIES: No pedal edema. SKIN: No major bruising or rash MUSCULOSKELETAL: No obvious joint redness or swelling LYMPHATICS: No palpable adenopathy in axillary or inguinal areas.   LAB RESULTS:    Component Value Date/Time   NA 143 12/31/2014 2204   NA 141 09/29/2011 1424   K 4.0 12/31/2014 2204   K 3.9 09/29/2011 1424   CL 107 12/31/2014 2204   CL 100 09/29/2011 1424   CO2 29 12/31/2014 2204   CO2 25 09/29/2011 1424   GLUCOSE 113* 12/31/2014 2204   GLUCOSE 172* 09/29/2011 1424   BUN 18 12/31/2014 2204   BUN 19* 09/29/2011 1424   CREATININE 0.65 12/31/2014 2204   CREATININE 0.79 09/29/2011 1424   CALCIUM 9.6 12/31/2014 2204   CALCIUM 10.1 09/29/2011 1424   PROT 7.8 12/31/2014 2204   PROT 8.5* 09/29/2011 1424   ALBUMIN 4.1 12/31/2014 2204   ALBUMIN 4.1 09/29/2011 1424   AST 22 12/31/2014 2204   AST 24 09/29/2011 1424   ALT 13* 12/31/2014 2204   ALT 12 09/29/2011 1424   ALKPHOS 75 12/31/2014 2204   ALKPHOS 76 09/29/2011 1424   BILITOT 0.7 12/31/2014 2204   GFRNONAA >60 12/31/2014 2204   GFRAA >60 12/31/2014 2204   Lab Results  Component Value Date   WBC 6.4 12/31/2014   NEUTROABS 4.2 12/31/2014   HGB 13.2 12/31/2014   HCT 39.3 12/31/2014   MCV 89.1 12/31/2014   PLT 189 12/31/2014     STUDIES: No results found.   ASSESSMENT / PLAN:   Hypogammaglobulinemia IgG and IgM (levels 493 and 14 respectively on 01/16/2015), elevated serum IgA (1604 on 01/16/2015). -  Patient referred here for hematology evaluation. Have reviewed records sent benefiting physician including recent labs and discussed in detail with patient. She does have some ongoing GI  issues but otherwise does not have fevers, major infections or new/progressive bone pains. She denies history of renal insufficiency, anemia or hypercalcemia. Have explained that hypogammaglobulinemia could be seen as a nonspecific finding but will also need to rule out underlying hematological disorders like chronic lymphocytic leukemia or plasma cell dyscrasia like myeloma. Elevated IgA could be secondary to ongoing GI symptoms but again we will need to rule out monoclonal paraproteinemia or myeloma. We will get further workup today including CBC and differential, creatinine, calcium, serum immunofixation electrophoresis to get repeat immunoglobulin levels and look for any monoclonal paraproteinemia, peripheral blood flow cytometry to look for monoclonal B-cell population/lymphoproliferative disorder like CLL. We will see her back in about  3-4 weeks and make further plan of management based upon results of this workup.     In between visits, the patient has been advised to call or come to the ER in case of fevers, chills, bleeding, acute sickness or new symptoms. Patient is agreeable to this plan.    Leia Alf, MD   03/16/2015 8:55 PM

## 2015-03-21 ENCOUNTER — Other Ambulatory Visit: Payer: Self-pay | Admitting: Family Medicine

## 2015-03-23 ENCOUNTER — Telehealth: Payer: Self-pay | Admitting: *Deleted

## 2015-03-23 NOTE — Telephone Encounter (Signed)
Pt had called leslie over the weekend through pt calling answering service this weekend.  What led her to calling is that she got a survey that spoke about cancer dx questions and naturally felt that she must have cancer based on the results of her labs.  I printed labs from [place] and had dr Grayland Ormond about them. The pt was sent here for new evaluation of immunoglobin levels being abnl.  The levels on [facility] indicated same as labs done from GI office.  M spike was 1.0 and after finnegan reviewed them he states that when immunoglobulin levels are abnl so can m spike and pt should keep her appt for August with pandit and the labs would cont. To be monitored.  I told pt since it is a cancer center she got a survey because she came here as new pt and the survey does not take into account that she can for a hematological issue.  At  This point with labs reviewed she does not have cancer per finnegan.  Patient wanted me to tell someone in management that there should be 2 kinds of surveys. One for cancer and one for other.  I did email Freida Busman with this suggestion.

## 2015-04-08 ENCOUNTER — Inpatient Hospital Stay: Payer: Commercial Managed Care - HMO | Attending: Internal Medicine | Admitting: Internal Medicine

## 2015-04-08 VITALS — BP 154/89 | HR 88 | Temp 98.1°F | Resp 16 | Wt 166.4 lb

## 2015-04-08 DIAGNOSIS — D472 Monoclonal gammopathy: Secondary | ICD-10-CM | POA: Diagnosis not present

## 2015-04-08 DIAGNOSIS — J302 Other seasonal allergic rhinitis: Secondary | ICD-10-CM | POA: Insufficient documentation

## 2015-04-08 DIAGNOSIS — D801 Nonfamilial hypogammaglobulinemia: Secondary | ICD-10-CM

## 2015-04-08 DIAGNOSIS — Z79899 Other long term (current) drug therapy: Secondary | ICD-10-CM | POA: Diagnosis not present

## 2015-04-08 DIAGNOSIS — E559 Vitamin D deficiency, unspecified: Secondary | ICD-10-CM | POA: Insufficient documentation

## 2015-04-08 DIAGNOSIS — M810 Age-related osteoporosis without current pathological fracture: Secondary | ICD-10-CM | POA: Insufficient documentation

## 2015-04-08 DIAGNOSIS — E785 Hyperlipidemia, unspecified: Secondary | ICD-10-CM | POA: Insufficient documentation

## 2015-04-09 ENCOUNTER — Telehealth: Payer: Self-pay | Admitting: *Deleted

## 2015-04-09 ENCOUNTER — Ambulatory Visit
Admission: RE | Admit: 2015-04-09 | Discharge: 2015-04-09 | Disposition: A | Discharge status: Home / Self Care | Payer: Commercial Managed Care - HMO | Source: Ambulatory Visit | Attending: Internal Medicine | Admitting: Internal Medicine

## 2015-04-09 DIAGNOSIS — D472 Monoclonal gammopathy: Secondary | ICD-10-CM | POA: Insufficient documentation

## 2015-04-09 DIAGNOSIS — M5030 Other cervical disc degeneration, unspecified cervical region: Secondary | ICD-10-CM | POA: Insufficient documentation

## 2015-04-09 NOTE — Telephone Encounter (Signed)
rcvd call from pt that she needed [facility] forms for her blood work because she does get labs done at cancer. All labs ordered on [facility] sheet: cbc with diff, creat., calcium, IGA, IGG, IGM, SIEP, kappa/lambda light chains at 16 weeks and 32 weeks.

## 2015-04-18 DIAGNOSIS — S72402A Unspecified fracture of lower end of left femur, initial encounter for closed fracture: Secondary | ICD-10-CM | POA: Diagnosis not present

## 2015-04-18 DIAGNOSIS — W19XXXA Unspecified fall, initial encounter: Secondary | ICD-10-CM | POA: Diagnosis not present

## 2015-04-18 DIAGNOSIS — S72442A Displaced fracture of lower epiphysis (separation) of left femur, initial encounter for closed fracture: Secondary | ICD-10-CM | POA: Diagnosis not present

## 2015-04-18 DIAGNOSIS — Z466 Encounter for fitting and adjustment of urinary device: Secondary | ICD-10-CM | POA: Diagnosis not present

## 2015-04-18 DIAGNOSIS — R102 Pelvic and perineal pain: Secondary | ICD-10-CM | POA: Diagnosis not present

## 2015-04-18 DIAGNOSIS — M8580 Other specified disorders of bone density and structure, unspecified site: Secondary | ICD-10-CM | POA: Diagnosis not present

## 2015-04-18 DIAGNOSIS — M6281 Muscle weakness (generalized): Secondary | ICD-10-CM | POA: Diagnosis not present

## 2015-04-18 DIAGNOSIS — S72409A Unspecified fracture of lower end of unspecified femur, initial encounter for closed fracture: Secondary | ICD-10-CM | POA: Diagnosis not present

## 2015-04-18 DIAGNOSIS — R2689 Other abnormalities of gait and mobility: Secondary | ICD-10-CM | POA: Diagnosis not present

## 2015-04-18 DIAGNOSIS — M25571 Pain in right ankle and joints of right foot: Secondary | ICD-10-CM | POA: Diagnosis not present

## 2015-04-18 DIAGNOSIS — M25562 Pain in left knee: Secondary | ICD-10-CM | POA: Diagnosis not present

## 2015-04-18 DIAGNOSIS — W010XXA Fall on same level from slipping, tripping and stumbling without subsequent striking against object, initial encounter: Secondary | ICD-10-CM | POA: Diagnosis not present

## 2015-04-18 DIAGNOSIS — R339 Retention of urine, unspecified: Secondary | ICD-10-CM | POA: Diagnosis not present

## 2015-04-18 DIAGNOSIS — S72462A Displaced supracondylar fracture with intracondylar extension of lower end of left femur, initial encounter for closed fracture: Secondary | ICD-10-CM | POA: Diagnosis not present

## 2015-04-18 DIAGNOSIS — I2699 Other pulmonary embolism without acute cor pulmonale: Secondary | ICD-10-CM | POA: Diagnosis not present

## 2015-04-18 DIAGNOSIS — E559 Vitamin D deficiency, unspecified: Secondary | ICD-10-CM | POA: Diagnosis not present

## 2015-04-18 DIAGNOSIS — S72492A Other fracture of lower end of left femur, initial encounter for closed fracture: Secondary | ICD-10-CM | POA: Diagnosis not present

## 2015-04-18 DIAGNOSIS — M62469 Contracture of muscle, unspecified lower leg: Secondary | ICD-10-CM | POA: Diagnosis not present

## 2015-04-18 DIAGNOSIS — M25462 Effusion, left knee: Secondary | ICD-10-CM | POA: Diagnosis not present

## 2015-04-18 DIAGNOSIS — M25569 Pain in unspecified knee: Secondary | ICD-10-CM | POA: Diagnosis not present

## 2015-04-18 DIAGNOSIS — R410 Disorientation, unspecified: Secondary | ICD-10-CM | POA: Diagnosis not present

## 2015-04-18 DIAGNOSIS — S8992XA Unspecified injury of left lower leg, initial encounter: Secondary | ICD-10-CM | POA: Diagnosis not present

## 2015-04-18 DIAGNOSIS — M85862 Other specified disorders of bone density and structure, left lower leg: Secondary | ICD-10-CM | POA: Diagnosis not present

## 2015-04-18 DIAGNOSIS — R918 Other nonspecific abnormal finding of lung field: Secondary | ICD-10-CM | POA: Diagnosis not present

## 2015-04-18 DIAGNOSIS — Z043 Encounter for examination and observation following other accident: Secondary | ICD-10-CM | POA: Diagnosis not present

## 2015-04-18 DIAGNOSIS — R0602 Shortness of breath: Secondary | ICD-10-CM | POA: Diagnosis not present

## 2015-04-18 DIAGNOSIS — G8918 Other acute postprocedural pain: Secondary | ICD-10-CM | POA: Diagnosis not present

## 2015-04-18 DIAGNOSIS — K5900 Constipation, unspecified: Secondary | ICD-10-CM | POA: Diagnosis not present

## 2015-04-18 DIAGNOSIS — Z7901 Long term (current) use of anticoagulants: Secondary | ICD-10-CM | POA: Diagnosis not present

## 2015-04-18 DIAGNOSIS — S72462D Displaced supracondylar fracture with intracondylar extension of lower end of left femur, subsequent encounter for closed fracture with routine healing: Secondary | ICD-10-CM | POA: Diagnosis not present

## 2015-04-18 DIAGNOSIS — S7292XA Unspecified fracture of left femur, initial encounter for closed fracture: Secondary | ICD-10-CM | POA: Diagnosis not present

## 2015-04-18 DIAGNOSIS — R279 Unspecified lack of coordination: Secondary | ICD-10-CM | POA: Diagnosis not present

## 2015-04-18 DIAGNOSIS — D472 Monoclonal gammopathy: Secondary | ICD-10-CM | POA: Diagnosis not present

## 2015-04-18 DIAGNOSIS — Z9181 History of falling: Secondary | ICD-10-CM | POA: Diagnosis not present

## 2015-04-18 HISTORY — PX: FEMUR FRACTURE SURGERY: SHX633

## 2015-04-19 DIAGNOSIS — S7292XA Unspecified fracture of left femur, initial encounter for closed fracture: Secondary | ICD-10-CM | POA: Insufficient documentation

## 2015-04-23 NOTE — Progress Notes (Signed)
Kristin Wise  Telephone:(336) [phone] Fax:(336) [phone]     ID: ELESHIA WOOLEY OB: 09/28/1940  MR#: [id number]  AQT#:[id number]  [name], [name]  CHIEF COMPLAINT/DIAGNOSIS:  Hypogammaglobulinemia IgG and IgM (levels 493 and 14 respectively on 01/16/15), elevated serum IgA (1604 on 01/16/15) - asymptomatic, has Monoclonal Gammopathy of Unknown Significance (MGUS) with M-spike of 1.0 g/dL on SIEP done 03/16/15.   Workup done on 03/16/15 -  Hb 13.0, MCV 88, WBC 5.5, unremarkable differential, platelets 209, Cr 0.6, Ca 9.4. Serum kappa light chains normal at 8.60, lambda light chain is normal at 11.8, kappa/lambda ratio normal at 0.72. SIEP showed M-spike 1.0 (IgG monoclonal protein with lambda light chain specificity), serum IgA elevated at 1761, IgG slightly low at 568, IgM low at 18. Blood CLL FISH panel unremarkable.   HISTORY OF PRESENT ILLNESS:  Patient returns for continued follow-up, she had labs done on July 18 which is remarkable for monoclonal gammopathy with M spike of 1 g. Clinically states that she is doing about the same, chronic fatigue on exertion is unchanged. No fevers or night sweats. Denies any recurrent major infections or mouth sores. Appetite is good. No new bone pains.  REVIEW OF SYSTEMS:   ROS As in HPI above. In addition, no new headaches or focal weakness.  No abdominal pain, constipation, diarrhea, dysuria or hematuria. No new skin rash or bleeding symptoms. No new paresthesias in extremities.Marland Kitchen   PAST MEDICAL HISTORY: Reviewed. Past Medical History  Diagnosis Date  . Change in bowel habits   . Rectal bleeding   . Colon polyps   . Osteoporosis   . Hyperlipidemia   . Vitamin D deficiency   . Seasonal allergies   . Postmenopausal     PAST SURGICAL HISTORY: Reviewed. Past Surgical History  Procedure Laterality Date  . Tonsillectomy    . Breast biopsy Left 2009    negative  .  Colonoscopy with propofol N/A 02/05/2015    Procedure: COLONOSCOPY WITH PROPOFOL;  Surgeon: Manya Silvas, MD;  Location: [facility] ENDOSCOPY;  Service: Endoscopy;  Laterality: N/A;    FAMILY HISTORY: Reviewed. Family History  Problem Relation Age of Onset  . Breast cancer Cousin     paternal  . Kidney cancer Cousin     maternal  . Leukemia Cousin     maternal  . Stomach cancer      uncle  . Prostate cancer      grandfather  . Skin cancer      maternal grandparents    SOCIAL HISTORY: Reviewed. Social History  Substance Use Topics  . Smoking status: Never Smoker   . Smokeless tobacco: Not on file  . Alcohol Use: No    Allergies  Allergen Reactions  . Codeine     "Feels bad"  . Indomethacin     "Bad dreams"  . Mold Extract [Trichophyton]   . Morphine Other (See Comments)    Bad feeling  . Other     Sodium Laurel Sulfate- caused gums to peel, corners of mouth became sore  . Sulfa Antibiotics Hives    Feels terrible     Current Outpatient Prescriptions  Medication Sig Dispense Refill  . ibuprofen (ADVIL,MOTRIN) 200 MG tablet Take 1 tablet by mouth as needed.    Marland Kitchen L-Lysine 500 MG TABS Take 2 tablets by mouth 4 (four) times daily as needed (mouth sores).     . Vitamin D, Ergocalciferol, (DRISDOL) 50000 UNITS  CAPS capsule Take 1 capsule by mouth once a week.     No current facility-administered medications for this visit.    PHYSICAL EXAM: Filed Vitals:   04/08/15 1517  BP: 154/89  Pulse: 88  Temp: 98.1 F (36.7 C)  Resp: 16     Body mass index is 29.49 kg/(m^2).     GENERAL: Patient is alert and oriented and in no acute distress. No pallor. LUNGS: Bilaterally clear to auscultation, no rhonchi. ABDOMEN: Soft, nontender. No hepatosplenomegaly clinically.  LYMPHATICS: No palpable adenopathy in axillary or inguinal areas.   LAB RESULTS:    Component Value Date/Time   NA 143 12/31/2014 2204   NA 141 09/29/2011 1424   K 4.0 12/31/2014 2204   K 3.9 09/29/2011  1424   CL 107 12/31/2014 2204   CL 100 09/29/2011 1424   CO2 29 12/31/2014 2204   CO2 25 09/29/2011 1424   GLUCOSE 113* 12/31/2014 2204   GLUCOSE 172* 09/29/2011 1424   BUN 18 12/31/2014 2204   BUN 19* 09/29/2011 1424   CREATININE 0.65 12/31/2014 2204   CREATININE 0.79 09/29/2011 1424   CALCIUM 9.6 12/31/2014 2204   CALCIUM 10.1 09/29/2011 1424   PROT 7.8 12/31/2014 2204   PROT 8.5* 09/29/2011 1424   ALBUMIN 4.1 12/31/2014 2204   ALBUMIN 4.1 09/29/2011 1424   AST 22 12/31/2014 2204   AST 24 09/29/2011 1424   ALT 13* 12/31/2014 2204   ALT 12 09/29/2011 1424   ALKPHOS 75 12/31/2014 2204   ALKPHOS 76 09/29/2011 1424   BILITOT 0.7 12/31/2014 2204   BILITOT 0.8 09/29/2011 1424   GFRNONAA >60 12/31/2014 2204   GFRNONAA >60 09/29/2011 1424   GFRAA >60 12/31/2014 2204   GFRAA >60 09/29/2011 1424   Lab Results  Component Value Date   WBC 6.4 12/31/2014   NEUTROABS 4.2 12/31/2014   HGB 13.2 12/31/2014   HCT 39.3 12/31/2014   MCV 89.1 12/31/2014   PLT 189 12/31/2014     STUDIES:   ASSESSMENT / PLAN:   Hypogammaglobulinemia IgG and IgM (levels 493 and 14 respectively on 01/16/2015), elevated serum IgA (1604 on 01/16/15). asymptomatic, has Monoclonal Gammopathy of Unknown Significance (MGUS) with M-spike of 1.0 g/dL on SIEP done 03/16/15.  Workup done on 03/16/15 - Hb 13.0, MCV 88, WBC 5.5, unremarkable differential, platelets 209, Cr 0.6, Ca 9.4.  Serum kappa light chains normal at 8.60, lambda light chain is normal at 11.8, kappa/lambda ratio normal at 0.72. SIEP showed M-spike 1.0 (IgG monoclonal protein with lambda light chain specificity), serum IgA elevated at 1761, IgG slightly low at 568, IgM low at 18.  Blood CLL FISH panel unremarkable. -  Reviewed labs done and discussed with patient. Have explained that she does have relatively small M-spike of 1 g/dL, likely monoclonal gammopathy of unknown significance (MGUS) given that she does not have anemia/renal insufficiency/bone  pains but will also obtain skeletal x-ray survey to make sure she does not have lytic bone lesions. If x-ray is negative, then plan is continued monitoring and no treatment is indicated at this time. Will repeat MGUS indicis at q16 weeks, next MD follow-up at 32 weeks and make further plan of management.      In between visits, the patient has been advised to call or come to the ER in case of acute sickness or new symptoms. Patient is agreeable to this plan.     Addendum - Skeletal Xray survey on 04/09/15 is reported negative for lytic lesions. Plan is  as above.     Leia Alf, MD   04/23/2015 11:02 PM

## 2015-04-24 ENCOUNTER — Encounter
Admission: RE | Admit: 2015-04-24 | Discharge: 2015-04-24 | Disposition: A | Discharge status: Home / Self Care | Payer: Commercial Managed Care - HMO | Source: Ambulatory Visit | Attending: Internal Medicine | Admitting: Internal Medicine

## 2015-04-24 DIAGNOSIS — Z7901 Long term (current) use of anticoagulants: Secondary | ICD-10-CM | POA: Diagnosis not present

## 2015-04-24 DIAGNOSIS — M7989 Other specified soft tissue disorders: Secondary | ICD-10-CM | POA: Diagnosis not present

## 2015-04-24 DIAGNOSIS — S728X2D Other fracture of left femur, subsequent encounter for closed fracture with routine healing: Secondary | ICD-10-CM | POA: Diagnosis not present

## 2015-04-24 DIAGNOSIS — R2689 Other abnormalities of gait and mobility: Secondary | ICD-10-CM | POA: Diagnosis not present

## 2015-04-24 DIAGNOSIS — E559 Vitamin D deficiency, unspecified: Secondary | ICD-10-CM | POA: Diagnosis not present

## 2015-04-24 DIAGNOSIS — Z466 Encounter for fitting and adjustment of urinary device: Secondary | ICD-10-CM | POA: Diagnosis not present

## 2015-04-24 DIAGNOSIS — M1612 Unilateral primary osteoarthritis, left hip: Secondary | ICD-10-CM | POA: Diagnosis not present

## 2015-04-24 DIAGNOSIS — T148 Other injury of unspecified body region: Secondary | ICD-10-CM | POA: Diagnosis not present

## 2015-04-24 DIAGNOSIS — R319 Hematuria, unspecified: Secondary | ICD-10-CM | POA: Diagnosis not present

## 2015-04-24 DIAGNOSIS — R0989 Other specified symptoms and signs involving the circulatory and respiratory systems: Secondary | ICD-10-CM | POA: Diagnosis not present

## 2015-04-24 DIAGNOSIS — Z9181 History of falling: Secondary | ICD-10-CM | POA: Diagnosis not present

## 2015-04-24 DIAGNOSIS — R0600 Dyspnea, unspecified: Secondary | ICD-10-CM | POA: Diagnosis not present

## 2015-04-24 DIAGNOSIS — R0902 Hypoxemia: Secondary | ICD-10-CM | POA: Diagnosis not present

## 2015-04-24 DIAGNOSIS — M6281 Muscle weakness (generalized): Secondary | ICD-10-CM | POA: Diagnosis not present

## 2015-04-24 DIAGNOSIS — R279 Unspecified lack of coordination: Secondary | ICD-10-CM | POA: Diagnosis not present

## 2015-04-24 DIAGNOSIS — J90 Pleural effusion, not elsewhere classified: Secondary | ICD-10-CM | POA: Diagnosis not present

## 2015-04-24 DIAGNOSIS — K5900 Constipation, unspecified: Secondary | ICD-10-CM | POA: Diagnosis not present

## 2015-04-24 DIAGNOSIS — M810 Age-related osteoporosis without current pathological fracture: Secondary | ICD-10-CM | POA: Diagnosis not present

## 2015-04-24 DIAGNOSIS — R339 Retention of urine, unspecified: Secondary | ICD-10-CM | POA: Diagnosis not present

## 2015-04-24 DIAGNOSIS — J9811 Atelectasis: Secondary | ICD-10-CM | POA: Diagnosis not present

## 2015-04-24 DIAGNOSIS — I2699 Other pulmonary embolism without acute cor pulmonale: Secondary | ICD-10-CM | POA: Insufficient documentation

## 2015-04-24 DIAGNOSIS — S72462D Displaced supracondylar fracture with intracondylar extension of lower end of left femur, subsequent encounter for closed fracture with routine healing: Secondary | ICD-10-CM | POA: Diagnosis not present

## 2015-04-24 DIAGNOSIS — M25469 Effusion, unspecified knee: Secondary | ICD-10-CM | POA: Diagnosis not present

## 2015-04-24 DIAGNOSIS — S72452D Displaced supracondylar fracture without intracondylar extension of lower end of left femur, subsequent encounter for closed fracture with routine healing: Secondary | ICD-10-CM | POA: Diagnosis not present

## 2015-04-27 ENCOUNTER — Other Ambulatory Visit: Payer: Self-pay | Admitting: Gerontology

## 2015-04-27 ENCOUNTER — Ambulatory Visit
Admission: RE | Admit: 2015-04-27 | Discharge: 2015-04-27 | Disposition: A | Discharge status: Home / Self Care | Payer: Commercial Managed Care - HMO | Source: Ambulatory Visit | Attending: Gerontology | Admitting: Gerontology

## 2015-04-27 DIAGNOSIS — R7981 Abnormal blood-gas level: Secondary | ICD-10-CM

## 2015-04-27 DIAGNOSIS — J90 Pleural effusion, not elsewhere classified: Secondary | ICD-10-CM | POA: Insufficient documentation

## 2015-04-27 DIAGNOSIS — K5900 Constipation, unspecified: Secondary | ICD-10-CM | POA: Diagnosis not present

## 2015-04-27 DIAGNOSIS — J9811 Atelectasis: Secondary | ICD-10-CM | POA: Insufficient documentation

## 2015-04-27 DIAGNOSIS — I2699 Other pulmonary embolism without acute cor pulmonale: Secondary | ICD-10-CM | POA: Insufficient documentation

## 2015-04-27 DIAGNOSIS — R0600 Dyspnea, unspecified: Secondary | ICD-10-CM | POA: Insufficient documentation

## 2015-04-27 DIAGNOSIS — R339 Retention of urine, unspecified: Secondary | ICD-10-CM | POA: Diagnosis not present

## 2015-04-27 DIAGNOSIS — M810 Age-related osteoporosis without current pathological fracture: Secondary | ICD-10-CM | POA: Diagnosis not present

## 2015-04-27 MED ORDER — IOHEXOL 350 MG/ML SOLN
75.0000 mL | Freq: Once | INTRAVENOUS | Status: AC | PRN
  Administered 2015-04-27: 75 mL via INTRAVENOUS

## 2015-04-28 DIAGNOSIS — I2699 Other pulmonary embolism without acute cor pulmonale: Secondary | ICD-10-CM | POA: Diagnosis not present

## 2015-04-28 LAB — CBC WITH DIFFERENTIAL/PLATELET
BASOS ABS: 0 10*3/uL (ref 0–0.1)
BASOS PCT: 1 %
Eosinophils Absolute: 0.3 10*3/uL (ref 0–0.7)
Eosinophils Relative: 4 %
HEMATOCRIT: 26.8 % — AB (ref 35.0–47.0)
HEMOGLOBIN: 8.6 g/dL — AB (ref 12.0–16.0)
LYMPHS PCT: 18 %
Lymphs Abs: 1.1 10*3/uL (ref 1.0–3.6)
MCH: 29.5 pg (ref 26.0–34.0)
MCHC: 32.1 g/dL (ref 32.0–36.0)
MCV: 91.8 fL (ref 80.0–100.0)
Monocytes Absolute: 0.5 10*3/uL (ref 0.2–0.9)
Monocytes Relative: 9 %
NEUTROS ABS: 4.1 10*3/uL (ref 1.4–6.5)
NEUTROS PCT: 68 %
Platelets: 255 10*3/uL (ref 150–440)
RBC: 2.92 MIL/uL — AB (ref 3.80–5.20)
RDW: 14.6 % — AB (ref 11.5–14.5)
WBC: 6 10*3/uL (ref 3.6–11.0)

## 2015-04-28 LAB — COMPREHENSIVE METABOLIC PANEL
ALBUMIN: 2.8 g/dL — AB (ref 3.5–5.0)
ALK PHOS: 64 U/L (ref 38–126)
ALT: 13 U/L — AB (ref 14–54)
AST: 24 U/L (ref 15–41)
Anion gap: 9 (ref 5–15)
BILIRUBIN TOTAL: 0.9 mg/dL (ref 0.3–1.2)
BUN: 10 mg/dL (ref 6–20)
CO2: 26 mmol/L (ref 22–32)
CREATININE: 0.5 mg/dL (ref 0.44–1.00)
Calcium: 8.2 mg/dL — ABNORMAL LOW (ref 8.9–10.3)
Chloride: 108 mmol/L (ref 101–111)
GFR calc Af Amer: >60 mL/min (ref >60)
GFR calc non Af Amer: >60 mL/min (ref >60)
GLUCOSE: 93 mg/dL (ref 65–99)
POTASSIUM: 3.5 mmol/L (ref 3.5–5.1)
Sodium: 143 mmol/L (ref 135–145)
TOTAL PROTEIN: 6.1 g/dL — AB (ref 6.5–8.1)

## 2015-04-29 ENCOUNTER — Other Ambulatory Visit
Admission: RE | Admit: 2015-04-29 | Discharge: 2015-04-29 | Disposition: A | Discharge status: Home / Self Care | Payer: Commercial Managed Care - HMO | Attending: Internal Medicine | Admitting: Internal Medicine

## 2015-04-29 DIAGNOSIS — R319 Hematuria, unspecified: Secondary | ICD-10-CM | POA: Insufficient documentation

## 2015-04-29 LAB — URINALYSIS COMPLETE WITH MICROSCOPIC (ARMC ONLY)
Bilirubin Urine: NEGATIVE
Glucose, UA: NEGATIVE mg/dL
KETONES UR: NEGATIVE mg/dL
Nitrite: POSITIVE — AB
PH: 5 (ref 5.0–8.0)
PROTEIN: 100 mg/dL — AB
SPECIFIC GRAVITY, URINE: 1.033 — AB (ref 1.005–1.030)
SQUAMOUS EPITHELIAL / LPF: NONE SEEN

## 2015-04-30 ENCOUNTER — Encounter
Admission: RE | Admit: 2015-04-30 | Discharge: 2015-04-30 | Disposition: A | Discharge status: Home / Self Care | Payer: Commercial Managed Care - HMO | Source: Ambulatory Visit | Attending: Internal Medicine | Admitting: Internal Medicine

## 2015-04-30 DIAGNOSIS — I2699 Other pulmonary embolism without acute cor pulmonale: Secondary | ICD-10-CM | POA: Insufficient documentation

## 2015-05-21 DIAGNOSIS — M1612 Unilateral primary osteoarthritis, left hip: Secondary | ICD-10-CM | POA: Diagnosis not present

## 2015-05-21 DIAGNOSIS — S728X2D Other fracture of left femur, subsequent encounter for closed fracture with routine healing: Secondary | ICD-10-CM | POA: Diagnosis not present

## 2015-05-21 DIAGNOSIS — M7989 Other specified soft tissue disorders: Secondary | ICD-10-CM | POA: Diagnosis not present

## 2015-05-21 DIAGNOSIS — M25469 Effusion, unspecified knee: Secondary | ICD-10-CM | POA: Diagnosis not present

## 2015-05-21 DIAGNOSIS — S72452D Displaced supracondylar fracture without intracondylar extension of lower end of left femur, subsequent encounter for closed fracture with routine healing: Secondary | ICD-10-CM | POA: Diagnosis not present

## 2015-05-28 DIAGNOSIS — M810 Age-related osteoporosis without current pathological fracture: Secondary | ICD-10-CM | POA: Diagnosis not present

## 2015-05-28 DIAGNOSIS — I2699 Other pulmonary embolism without acute cor pulmonale: Secondary | ICD-10-CM | POA: Diagnosis not present

## 2015-05-30 DIAGNOSIS — W19XXXD Unspecified fall, subsequent encounter: Secondary | ICD-10-CM | POA: Diagnosis not present

## 2015-05-30 DIAGNOSIS — S72402D Unspecified fracture of lower end of left femur, subsequent encounter for closed fracture with routine healing: Secondary | ICD-10-CM | POA: Diagnosis not present

## 2015-05-30 DIAGNOSIS — I2699 Other pulmonary embolism without acute cor pulmonale: Secondary | ICD-10-CM | POA: Diagnosis not present

## 2015-05-30 DIAGNOSIS — Z7901 Long term (current) use of anticoagulants: Secondary | ICD-10-CM | POA: Diagnosis not present

## 2015-05-30 DIAGNOSIS — E559 Vitamin D deficiency, unspecified: Secondary | ICD-10-CM | POA: Diagnosis not present

## 2015-06-01 DIAGNOSIS — Z7901 Long term (current) use of anticoagulants: Secondary | ICD-10-CM | POA: Diagnosis not present

## 2015-06-01 DIAGNOSIS — W19XXXD Unspecified fall, subsequent encounter: Secondary | ICD-10-CM | POA: Diagnosis not present

## 2015-06-01 DIAGNOSIS — R197 Diarrhea, unspecified: Secondary | ICD-10-CM | POA: Diagnosis not present

## 2015-06-01 DIAGNOSIS — E559 Vitamin D deficiency, unspecified: Secondary | ICD-10-CM | POA: Diagnosis not present

## 2015-06-01 DIAGNOSIS — S72402D Unspecified fracture of lower end of left femur, subsequent encounter for closed fracture with routine healing: Secondary | ICD-10-CM | POA: Diagnosis not present

## 2015-06-01 DIAGNOSIS — I2699 Other pulmonary embolism without acute cor pulmonale: Secondary | ICD-10-CM | POA: Diagnosis not present

## 2015-06-02 DIAGNOSIS — E559 Vitamin D deficiency, unspecified: Secondary | ICD-10-CM | POA: Diagnosis not present

## 2015-06-02 DIAGNOSIS — W19XXXD Unspecified fall, subsequent encounter: Secondary | ICD-10-CM | POA: Diagnosis not present

## 2015-06-02 DIAGNOSIS — S72402D Unspecified fracture of lower end of left femur, subsequent encounter for closed fracture with routine healing: Secondary | ICD-10-CM | POA: Diagnosis not present

## 2015-06-02 DIAGNOSIS — Z7901 Long term (current) use of anticoagulants: Secondary | ICD-10-CM | POA: Diagnosis not present

## 2015-06-02 DIAGNOSIS — I2699 Other pulmonary embolism without acute cor pulmonale: Secondary | ICD-10-CM | POA: Diagnosis not present

## 2015-06-03 DIAGNOSIS — I2699 Other pulmonary embolism without acute cor pulmonale: Secondary | ICD-10-CM | POA: Diagnosis not present

## 2015-06-03 DIAGNOSIS — S72402D Unspecified fracture of lower end of left femur, subsequent encounter for closed fracture with routine healing: Secondary | ICD-10-CM | POA: Diagnosis not present

## 2015-06-03 DIAGNOSIS — Z7901 Long term (current) use of anticoagulants: Secondary | ICD-10-CM | POA: Diagnosis not present

## 2015-06-03 DIAGNOSIS — W19XXXD Unspecified fall, subsequent encounter: Secondary | ICD-10-CM | POA: Diagnosis not present

## 2015-06-03 DIAGNOSIS — E559 Vitamin D deficiency, unspecified: Secondary | ICD-10-CM | POA: Diagnosis not present

## 2015-06-05 DIAGNOSIS — I2699 Other pulmonary embolism without acute cor pulmonale: Secondary | ICD-10-CM | POA: Diagnosis not present

## 2015-06-05 DIAGNOSIS — S72402D Unspecified fracture of lower end of left femur, subsequent encounter for closed fracture with routine healing: Secondary | ICD-10-CM | POA: Diagnosis not present

## 2015-06-05 DIAGNOSIS — E559 Vitamin D deficiency, unspecified: Secondary | ICD-10-CM | POA: Diagnosis not present

## 2015-06-05 DIAGNOSIS — Z7901 Long term (current) use of anticoagulants: Secondary | ICD-10-CM | POA: Diagnosis not present

## 2015-06-05 DIAGNOSIS — W19XXXD Unspecified fall, subsequent encounter: Secondary | ICD-10-CM | POA: Diagnosis not present

## 2015-06-09 DIAGNOSIS — W19XXXD Unspecified fall, subsequent encounter: Secondary | ICD-10-CM | POA: Diagnosis not present

## 2015-06-09 DIAGNOSIS — E559 Vitamin D deficiency, unspecified: Secondary | ICD-10-CM | POA: Diagnosis not present

## 2015-06-09 DIAGNOSIS — S72402D Unspecified fracture of lower end of left femur, subsequent encounter for closed fracture with routine healing: Secondary | ICD-10-CM | POA: Diagnosis not present

## 2015-06-09 DIAGNOSIS — I2699 Other pulmonary embolism without acute cor pulmonale: Secondary | ICD-10-CM | POA: Diagnosis not present

## 2015-06-09 DIAGNOSIS — Z7901 Long term (current) use of anticoagulants: Secondary | ICD-10-CM | POA: Diagnosis not present

## 2015-06-10 DIAGNOSIS — Z7901 Long term (current) use of anticoagulants: Secondary | ICD-10-CM | POA: Diagnosis not present

## 2015-06-10 DIAGNOSIS — E559 Vitamin D deficiency, unspecified: Secondary | ICD-10-CM | POA: Diagnosis not present

## 2015-06-10 DIAGNOSIS — W19XXXD Unspecified fall, subsequent encounter: Secondary | ICD-10-CM | POA: Diagnosis not present

## 2015-06-10 DIAGNOSIS — I2699 Other pulmonary embolism without acute cor pulmonale: Secondary | ICD-10-CM | POA: Diagnosis not present

## 2015-06-10 DIAGNOSIS — S72402D Unspecified fracture of lower end of left femur, subsequent encounter for closed fracture with routine healing: Secondary | ICD-10-CM | POA: Diagnosis not present

## 2015-06-16 DIAGNOSIS — S72402D Unspecified fracture of lower end of left femur, subsequent encounter for closed fracture with routine healing: Secondary | ICD-10-CM | POA: Diagnosis not present

## 2015-06-16 DIAGNOSIS — E559 Vitamin D deficiency, unspecified: Secondary | ICD-10-CM | POA: Diagnosis not present

## 2015-06-16 DIAGNOSIS — Z7901 Long term (current) use of anticoagulants: Secondary | ICD-10-CM | POA: Diagnosis not present

## 2015-06-16 DIAGNOSIS — I2699 Other pulmonary embolism without acute cor pulmonale: Secondary | ICD-10-CM | POA: Diagnosis not present

## 2015-06-16 DIAGNOSIS — W19XXXD Unspecified fall, subsequent encounter: Secondary | ICD-10-CM | POA: Diagnosis not present

## 2015-06-18 DIAGNOSIS — I2699 Other pulmonary embolism without acute cor pulmonale: Secondary | ICD-10-CM | POA: Diagnosis not present

## 2015-06-18 DIAGNOSIS — W19XXXD Unspecified fall, subsequent encounter: Secondary | ICD-10-CM | POA: Diagnosis not present

## 2015-06-18 DIAGNOSIS — S72402D Unspecified fracture of lower end of left femur, subsequent encounter for closed fracture with routine healing: Secondary | ICD-10-CM | POA: Diagnosis not present

## 2015-06-18 DIAGNOSIS — E559 Vitamin D deficiency, unspecified: Secondary | ICD-10-CM | POA: Diagnosis not present

## 2015-06-18 DIAGNOSIS — Z7901 Long term (current) use of anticoagulants: Secondary | ICD-10-CM | POA: Diagnosis not present

## 2015-06-22 DIAGNOSIS — S72402D Unspecified fracture of lower end of left femur, subsequent encounter for closed fracture with routine healing: Secondary | ICD-10-CM | POA: Diagnosis not present

## 2015-06-22 DIAGNOSIS — Z7901 Long term (current) use of anticoagulants: Secondary | ICD-10-CM | POA: Diagnosis not present

## 2015-06-22 DIAGNOSIS — I2699 Other pulmonary embolism without acute cor pulmonale: Secondary | ICD-10-CM | POA: Diagnosis not present

## 2015-06-22 DIAGNOSIS — E559 Vitamin D deficiency, unspecified: Secondary | ICD-10-CM | POA: Diagnosis not present

## 2015-06-22 DIAGNOSIS — W19XXXD Unspecified fall, subsequent encounter: Secondary | ICD-10-CM | POA: Diagnosis not present

## 2015-06-23 DIAGNOSIS — M1612 Unilateral primary osteoarthritis, left hip: Secondary | ICD-10-CM | POA: Diagnosis not present

## 2015-06-23 DIAGNOSIS — Z7982 Long term (current) use of aspirin: Secondary | ICD-10-CM | POA: Diagnosis not present

## 2015-06-23 DIAGNOSIS — Z9889 Other specified postprocedural states: Secondary | ICD-10-CM | POA: Diagnosis not present

## 2015-06-23 DIAGNOSIS — Z79899 Other long term (current) drug therapy: Secondary | ICD-10-CM | POA: Diagnosis not present

## 2015-06-23 DIAGNOSIS — Z882 Allergy status to sulfonamides status: Secondary | ICD-10-CM | POA: Diagnosis not present

## 2015-06-23 DIAGNOSIS — S72352A Displaced comminuted fracture of shaft of left femur, initial encounter for closed fracture: Secondary | ICD-10-CM | POA: Diagnosis not present

## 2015-06-25 DIAGNOSIS — S72402D Unspecified fracture of lower end of left femur, subsequent encounter for closed fracture with routine healing: Secondary | ICD-10-CM | POA: Diagnosis not present

## 2015-06-25 DIAGNOSIS — W19XXXD Unspecified fall, subsequent encounter: Secondary | ICD-10-CM | POA: Diagnosis not present

## 2015-06-25 DIAGNOSIS — E559 Vitamin D deficiency, unspecified: Secondary | ICD-10-CM | POA: Diagnosis not present

## 2015-06-25 DIAGNOSIS — I2699 Other pulmonary embolism without acute cor pulmonale: Secondary | ICD-10-CM | POA: Diagnosis not present

## 2015-06-25 DIAGNOSIS — Z7901 Long term (current) use of anticoagulants: Secondary | ICD-10-CM | POA: Diagnosis not present

## 2015-06-30 DIAGNOSIS — E559 Vitamin D deficiency, unspecified: Secondary | ICD-10-CM | POA: Diagnosis not present

## 2015-06-30 DIAGNOSIS — Z7901 Long term (current) use of anticoagulants: Secondary | ICD-10-CM | POA: Diagnosis not present

## 2015-06-30 DIAGNOSIS — W19XXXD Unspecified fall, subsequent encounter: Secondary | ICD-10-CM | POA: Diagnosis not present

## 2015-06-30 DIAGNOSIS — S72402D Unspecified fracture of lower end of left femur, subsequent encounter for closed fracture with routine healing: Secondary | ICD-10-CM | POA: Diagnosis not present

## 2015-06-30 DIAGNOSIS — I2699 Other pulmonary embolism without acute cor pulmonale: Secondary | ICD-10-CM | POA: Diagnosis not present

## 2015-07-01 DIAGNOSIS — Z0000 Encounter for general adult medical examination without abnormal findings: Secondary | ICD-10-CM | POA: Diagnosis not present

## 2015-07-01 DIAGNOSIS — E559 Vitamin D deficiency, unspecified: Secondary | ICD-10-CM | POA: Diagnosis not present

## 2015-07-02 DIAGNOSIS — W19XXXD Unspecified fall, subsequent encounter: Secondary | ICD-10-CM | POA: Diagnosis not present

## 2015-07-02 DIAGNOSIS — S72402D Unspecified fracture of lower end of left femur, subsequent encounter for closed fracture with routine healing: Secondary | ICD-10-CM | POA: Diagnosis not present

## 2015-07-02 DIAGNOSIS — E559 Vitamin D deficiency, unspecified: Secondary | ICD-10-CM | POA: Diagnosis not present

## 2015-07-02 DIAGNOSIS — Z7901 Long term (current) use of anticoagulants: Secondary | ICD-10-CM | POA: Diagnosis not present

## 2015-07-02 DIAGNOSIS — I2699 Other pulmonary embolism without acute cor pulmonale: Secondary | ICD-10-CM | POA: Diagnosis not present

## 2015-07-06 DIAGNOSIS — S72402D Unspecified fracture of lower end of left femur, subsequent encounter for closed fracture with routine healing: Secondary | ICD-10-CM | POA: Diagnosis not present

## 2015-07-06 DIAGNOSIS — E559 Vitamin D deficiency, unspecified: Secondary | ICD-10-CM | POA: Diagnosis not present

## 2015-07-06 DIAGNOSIS — Z7901 Long term (current) use of anticoagulants: Secondary | ICD-10-CM | POA: Diagnosis not present

## 2015-07-06 DIAGNOSIS — I2699 Other pulmonary embolism without acute cor pulmonale: Secondary | ICD-10-CM | POA: Diagnosis not present

## 2015-07-06 DIAGNOSIS — W19XXXD Unspecified fall, subsequent encounter: Secondary | ICD-10-CM | POA: Diagnosis not present

## 2015-07-13 DIAGNOSIS — E559 Vitamin D deficiency, unspecified: Secondary | ICD-10-CM | POA: Diagnosis not present

## 2015-07-13 DIAGNOSIS — Z0000 Encounter for general adult medical examination without abnormal findings: Secondary | ICD-10-CM | POA: Diagnosis not present

## 2015-07-13 DIAGNOSIS — W19XXXD Unspecified fall, subsequent encounter: Secondary | ICD-10-CM | POA: Diagnosis not present

## 2015-07-13 DIAGNOSIS — E785 Hyperlipidemia, unspecified: Secondary | ICD-10-CM | POA: Diagnosis not present

## 2015-07-13 DIAGNOSIS — Z7901 Long term (current) use of anticoagulants: Secondary | ICD-10-CM | POA: Diagnosis not present

## 2015-07-13 DIAGNOSIS — I10 Essential (primary) hypertension: Secondary | ICD-10-CM | POA: Diagnosis not present

## 2015-07-13 DIAGNOSIS — S72402D Unspecified fracture of lower end of left femur, subsequent encounter for closed fracture with routine healing: Secondary | ICD-10-CM | POA: Diagnosis not present

## 2015-07-13 DIAGNOSIS — I2699 Other pulmonary embolism without acute cor pulmonale: Secondary | ICD-10-CM | POA: Diagnosis not present

## 2015-07-13 DIAGNOSIS — Z23 Encounter for immunization: Secondary | ICD-10-CM | POA: Diagnosis not present

## 2015-07-15 DIAGNOSIS — W19XXXD Unspecified fall, subsequent encounter: Secondary | ICD-10-CM | POA: Diagnosis not present

## 2015-07-15 DIAGNOSIS — S72402D Unspecified fracture of lower end of left femur, subsequent encounter for closed fracture with routine healing: Secondary | ICD-10-CM | POA: Diagnosis not present

## 2015-07-15 DIAGNOSIS — Z7901 Long term (current) use of anticoagulants: Secondary | ICD-10-CM | POA: Diagnosis not present

## 2015-07-15 DIAGNOSIS — E559 Vitamin D deficiency, unspecified: Secondary | ICD-10-CM | POA: Diagnosis not present

## 2015-07-15 DIAGNOSIS — I2699 Other pulmonary embolism without acute cor pulmonale: Secondary | ICD-10-CM | POA: Diagnosis not present

## 2015-07-16 ENCOUNTER — Encounter: Payer: Self-pay | Admitting: Internal Medicine

## 2015-07-16 DIAGNOSIS — D472 Monoclonal gammopathy: Secondary | ICD-10-CM | POA: Diagnosis not present

## 2015-07-21 DIAGNOSIS — S79102A Unspecified physeal fracture of lower end of left femur, initial encounter for closed fracture: Secondary | ICD-10-CM | POA: Diagnosis not present

## 2015-07-21 DIAGNOSIS — S72302D Unspecified fracture of shaft of left femur, subsequent encounter for closed fracture with routine healing: Secondary | ICD-10-CM | POA: Diagnosis not present

## 2015-07-21 DIAGNOSIS — M25562 Pain in left knee: Secondary | ICD-10-CM | POA: Diagnosis not present

## 2015-07-21 DIAGNOSIS — M25462 Effusion, left knee: Secondary | ICD-10-CM | POA: Diagnosis not present

## 2015-07-21 DIAGNOSIS — S72402A Unspecified fracture of lower end of left femur, initial encounter for closed fracture: Secondary | ICD-10-CM | POA: Diagnosis not present

## 2015-07-22 DIAGNOSIS — S72402D Unspecified fracture of lower end of left femur, subsequent encounter for closed fracture with routine healing: Secondary | ICD-10-CM | POA: Diagnosis not present

## 2015-07-22 DIAGNOSIS — E559 Vitamin D deficiency, unspecified: Secondary | ICD-10-CM | POA: Diagnosis not present

## 2015-07-22 DIAGNOSIS — I2699 Other pulmonary embolism without acute cor pulmonale: Secondary | ICD-10-CM | POA: Diagnosis not present

## 2015-07-22 DIAGNOSIS — W19XXXD Unspecified fall, subsequent encounter: Secondary | ICD-10-CM | POA: Diagnosis not present

## 2015-07-22 DIAGNOSIS — Z7901 Long term (current) use of anticoagulants: Secondary | ICD-10-CM | POA: Diagnosis not present

## 2015-07-24 DIAGNOSIS — I2699 Other pulmonary embolism without acute cor pulmonale: Secondary | ICD-10-CM | POA: Diagnosis not present

## 2015-07-24 DIAGNOSIS — E559 Vitamin D deficiency, unspecified: Secondary | ICD-10-CM | POA: Diagnosis not present

## 2015-07-24 DIAGNOSIS — Z7901 Long term (current) use of anticoagulants: Secondary | ICD-10-CM | POA: Diagnosis not present

## 2015-07-24 DIAGNOSIS — S72402D Unspecified fracture of lower end of left femur, subsequent encounter for closed fracture with routine healing: Secondary | ICD-10-CM | POA: Diagnosis not present

## 2015-07-24 DIAGNOSIS — W19XXXD Unspecified fall, subsequent encounter: Secondary | ICD-10-CM | POA: Diagnosis not present

## 2015-07-29 ENCOUNTER — Other Ambulatory Visit: Payer: Commercial Managed Care - HMO

## 2015-07-29 DIAGNOSIS — E559 Vitamin D deficiency, unspecified: Secondary | ICD-10-CM | POA: Diagnosis not present

## 2015-07-29 DIAGNOSIS — Z86711 Personal history of pulmonary embolism: Secondary | ICD-10-CM | POA: Diagnosis not present

## 2015-07-29 DIAGNOSIS — S72402D Unspecified fracture of lower end of left femur, subsequent encounter for closed fracture with routine healing: Secondary | ICD-10-CM | POA: Diagnosis not present

## 2015-07-29 DIAGNOSIS — W19XXXD Unspecified fall, subsequent encounter: Secondary | ICD-10-CM | POA: Diagnosis not present

## 2015-07-31 DIAGNOSIS — W19XXXD Unspecified fall, subsequent encounter: Secondary | ICD-10-CM | POA: Diagnosis not present

## 2015-07-31 DIAGNOSIS — E559 Vitamin D deficiency, unspecified: Secondary | ICD-10-CM | POA: Diagnosis not present

## 2015-07-31 DIAGNOSIS — Z86711 Personal history of pulmonary embolism: Secondary | ICD-10-CM | POA: Diagnosis not present

## 2015-07-31 DIAGNOSIS — S72402D Unspecified fracture of lower end of left femur, subsequent encounter for closed fracture with routine healing: Secondary | ICD-10-CM | POA: Diagnosis not present

## 2015-08-03 DIAGNOSIS — W19XXXD Unspecified fall, subsequent encounter: Secondary | ICD-10-CM | POA: Diagnosis not present

## 2015-08-03 DIAGNOSIS — S72402D Unspecified fracture of lower end of left femur, subsequent encounter for closed fracture with routine healing: Secondary | ICD-10-CM | POA: Diagnosis not present

## 2015-08-03 DIAGNOSIS — E559 Vitamin D deficiency, unspecified: Secondary | ICD-10-CM | POA: Diagnosis not present

## 2015-08-03 DIAGNOSIS — Z86711 Personal history of pulmonary embolism: Secondary | ICD-10-CM | POA: Diagnosis not present

## 2015-08-05 DIAGNOSIS — W19XXXD Unspecified fall, subsequent encounter: Secondary | ICD-10-CM | POA: Diagnosis not present

## 2015-08-05 DIAGNOSIS — E559 Vitamin D deficiency, unspecified: Secondary | ICD-10-CM | POA: Diagnosis not present

## 2015-08-05 DIAGNOSIS — S72402D Unspecified fracture of lower end of left femur, subsequent encounter for closed fracture with routine healing: Secondary | ICD-10-CM | POA: Diagnosis not present

## 2015-08-05 DIAGNOSIS — Z86711 Personal history of pulmonary embolism: Secondary | ICD-10-CM | POA: Diagnosis not present

## 2015-08-10 DIAGNOSIS — Z86711 Personal history of pulmonary embolism: Secondary | ICD-10-CM | POA: Diagnosis not present

## 2015-08-10 DIAGNOSIS — E559 Vitamin D deficiency, unspecified: Secondary | ICD-10-CM | POA: Diagnosis not present

## 2015-08-10 DIAGNOSIS — S72402D Unspecified fracture of lower end of left femur, subsequent encounter for closed fracture with routine healing: Secondary | ICD-10-CM | POA: Diagnosis not present

## 2015-08-10 DIAGNOSIS — W19XXXD Unspecified fall, subsequent encounter: Secondary | ICD-10-CM | POA: Diagnosis not present

## 2015-08-12 DIAGNOSIS — S72402D Unspecified fracture of lower end of left femur, subsequent encounter for closed fracture with routine healing: Secondary | ICD-10-CM | POA: Diagnosis not present

## 2015-08-12 DIAGNOSIS — E559 Vitamin D deficiency, unspecified: Secondary | ICD-10-CM | POA: Diagnosis not present

## 2015-08-12 DIAGNOSIS — Z86711 Personal history of pulmonary embolism: Secondary | ICD-10-CM | POA: Diagnosis not present

## 2015-08-12 DIAGNOSIS — W19XXXD Unspecified fall, subsequent encounter: Secondary | ICD-10-CM | POA: Diagnosis not present

## 2015-08-17 DIAGNOSIS — S72402D Unspecified fracture of lower end of left femur, subsequent encounter for closed fracture with routine healing: Secondary | ICD-10-CM | POA: Diagnosis not present

## 2015-08-17 DIAGNOSIS — E559 Vitamin D deficiency, unspecified: Secondary | ICD-10-CM | POA: Diagnosis not present

## 2015-08-17 DIAGNOSIS — W19XXXD Unspecified fall, subsequent encounter: Secondary | ICD-10-CM | POA: Diagnosis not present

## 2015-08-17 DIAGNOSIS — Z86711 Personal history of pulmonary embolism: Secondary | ICD-10-CM | POA: Diagnosis not present

## 2015-08-19 DIAGNOSIS — S72402D Unspecified fracture of lower end of left femur, subsequent encounter for closed fracture with routine healing: Secondary | ICD-10-CM | POA: Diagnosis not present

## 2015-08-19 DIAGNOSIS — E559 Vitamin D deficiency, unspecified: Secondary | ICD-10-CM | POA: Diagnosis not present

## 2015-08-19 DIAGNOSIS — W19XXXD Unspecified fall, subsequent encounter: Secondary | ICD-10-CM | POA: Diagnosis not present

## 2015-08-19 DIAGNOSIS — Z86711 Personal history of pulmonary embolism: Secondary | ICD-10-CM | POA: Diagnosis not present

## 2015-08-26 DIAGNOSIS — E559 Vitamin D deficiency, unspecified: Secondary | ICD-10-CM | POA: Diagnosis not present

## 2015-08-26 DIAGNOSIS — Z86711 Personal history of pulmonary embolism: Secondary | ICD-10-CM | POA: Diagnosis not present

## 2015-08-26 DIAGNOSIS — S72402D Unspecified fracture of lower end of left femur, subsequent encounter for closed fracture with routine healing: Secondary | ICD-10-CM | POA: Diagnosis not present

## 2015-08-26 DIAGNOSIS — W19XXXD Unspecified fall, subsequent encounter: Secondary | ICD-10-CM | POA: Diagnosis not present

## 2015-09-01 DIAGNOSIS — S72462A Displaced supracondylar fracture with intracondylar extension of lower end of left femur, initial encounter for closed fracture: Secondary | ICD-10-CM | POA: Diagnosis not present

## 2015-09-09 DIAGNOSIS — H9313 Tinnitus, bilateral: Secondary | ICD-10-CM | POA: Diagnosis not present

## 2015-09-09 DIAGNOSIS — I10 Essential (primary) hypertension: Secondary | ICD-10-CM | POA: Diagnosis not present

## 2015-09-09 DIAGNOSIS — R21 Rash and other nonspecific skin eruption: Secondary | ICD-10-CM | POA: Diagnosis not present

## 2015-09-16 DIAGNOSIS — S72462D Displaced supracondylar fracture with intracondylar extension of lower end of left femur, subsequent encounter for closed fracture with routine healing: Secondary | ICD-10-CM | POA: Diagnosis not present

## 2015-09-21 DIAGNOSIS — S72462D Displaced supracondylar fracture with intracondylar extension of lower end of left femur, subsequent encounter for closed fracture with routine healing: Secondary | ICD-10-CM | POA: Diagnosis not present

## 2015-09-23 DIAGNOSIS — S72462D Displaced supracondylar fracture with intracondylar extension of lower end of left femur, subsequent encounter for closed fracture with routine healing: Secondary | ICD-10-CM | POA: Diagnosis not present

## 2015-09-25 DIAGNOSIS — M25562 Pain in left knee: Secondary | ICD-10-CM | POA: Diagnosis not present

## 2015-09-25 DIAGNOSIS — H2513 Age-related nuclear cataract, bilateral: Secondary | ICD-10-CM | POA: Diagnosis not present

## 2015-09-25 DIAGNOSIS — S72462D Displaced supracondylar fracture with intracondylar extension of lower end of left femur, subsequent encounter for closed fracture with routine healing: Secondary | ICD-10-CM | POA: Diagnosis not present

## 2015-09-25 DIAGNOSIS — G8929 Other chronic pain: Secondary | ICD-10-CM | POA: Diagnosis not present

## 2015-09-28 DIAGNOSIS — S72462D Displaced supracondylar fracture with intracondylar extension of lower end of left femur, subsequent encounter for closed fracture with routine healing: Secondary | ICD-10-CM | POA: Diagnosis not present

## 2015-09-29 DIAGNOSIS — H9313 Tinnitus, bilateral: Secondary | ICD-10-CM | POA: Insufficient documentation

## 2015-09-29 DIAGNOSIS — H903 Sensorineural hearing loss, bilateral: Secondary | ICD-10-CM | POA: Insufficient documentation

## 2015-09-29 DIAGNOSIS — H9319 Tinnitus, unspecified ear: Secondary | ICD-10-CM | POA: Insufficient documentation

## 2015-09-30 DIAGNOSIS — S72462D Displaced supracondylar fracture with intracondylar extension of lower end of left femur, subsequent encounter for closed fracture with routine healing: Secondary | ICD-10-CM | POA: Diagnosis not present

## 2015-10-02 DIAGNOSIS — S72462D Displaced supracondylar fracture with intracondylar extension of lower end of left femur, subsequent encounter for closed fracture with routine healing: Secondary | ICD-10-CM | POA: Diagnosis not present

## 2015-10-05 DIAGNOSIS — S72462D Displaced supracondylar fracture with intracondylar extension of lower end of left femur, subsequent encounter for closed fracture with routine healing: Secondary | ICD-10-CM | POA: Diagnosis not present

## 2015-10-07 DIAGNOSIS — R5381 Other malaise: Secondary | ICD-10-CM | POA: Diagnosis not present

## 2015-10-07 DIAGNOSIS — R5383 Other fatigue: Secondary | ICD-10-CM | POA: Diagnosis not present

## 2015-10-07 DIAGNOSIS — I10 Essential (primary) hypertension: Secondary | ICD-10-CM | POA: Diagnosis not present

## 2015-10-07 DIAGNOSIS — S72462D Displaced supracondylar fracture with intracondylar extension of lower end of left femur, subsequent encounter for closed fracture with routine healing: Secondary | ICD-10-CM | POA: Diagnosis not present

## 2015-10-09 DIAGNOSIS — S72462D Displaced supracondylar fracture with intracondylar extension of lower end of left femur, subsequent encounter for closed fracture with routine healing: Secondary | ICD-10-CM | POA: Diagnosis not present

## 2015-10-12 DIAGNOSIS — S72462D Displaced supracondylar fracture with intracondylar extension of lower end of left femur, subsequent encounter for closed fracture with routine healing: Secondary | ICD-10-CM | POA: Diagnosis not present

## 2015-10-14 DIAGNOSIS — D485 Neoplasm of uncertain behavior of skin: Secondary | ICD-10-CM | POA: Diagnosis not present

## 2015-10-14 DIAGNOSIS — Z1283 Encounter for screening for malignant neoplasm of skin: Secondary | ICD-10-CM | POA: Diagnosis not present

## 2015-10-14 DIAGNOSIS — L821 Other seborrheic keratosis: Secondary | ICD-10-CM | POA: Diagnosis not present

## 2015-10-14 DIAGNOSIS — S72462D Displaced supracondylar fracture with intracondylar extension of lower end of left femur, subsequent encounter for closed fracture with routine healing: Secondary | ICD-10-CM | POA: Diagnosis not present

## 2015-10-14 DIAGNOSIS — L578 Other skin changes due to chronic exposure to nonionizing radiation: Secondary | ICD-10-CM | POA: Diagnosis not present

## 2015-10-15 DIAGNOSIS — S79102A Unspecified physeal fracture of lower end of left femur, initial encounter for closed fracture: Secondary | ICD-10-CM | POA: Diagnosis not present

## 2015-10-15 DIAGNOSIS — M25462 Effusion, left knee: Secondary | ICD-10-CM | POA: Diagnosis not present

## 2015-10-15 DIAGNOSIS — M2242 Chondromalacia patellae, left knee: Secondary | ICD-10-CM | POA: Diagnosis not present

## 2015-10-15 DIAGNOSIS — S72402D Unspecified fracture of lower end of left femur, subsequent encounter for closed fracture with routine healing: Secondary | ICD-10-CM | POA: Diagnosis not present

## 2015-10-16 DIAGNOSIS — S72462D Displaced supracondylar fracture with intracondylar extension of lower end of left femur, subsequent encounter for closed fracture with routine healing: Secondary | ICD-10-CM | POA: Diagnosis not present

## 2015-10-19 DIAGNOSIS — S72462D Displaced supracondylar fracture with intracondylar extension of lower end of left femur, subsequent encounter for closed fracture with routine healing: Secondary | ICD-10-CM | POA: Diagnosis not present

## 2015-10-21 DIAGNOSIS — S72462D Displaced supracondylar fracture with intracondylar extension of lower end of left femur, subsequent encounter for closed fracture with routine healing: Secondary | ICD-10-CM | POA: Diagnosis not present

## 2015-10-23 DIAGNOSIS — S72462D Displaced supracondylar fracture with intracondylar extension of lower end of left femur, subsequent encounter for closed fracture with routine healing: Secondary | ICD-10-CM | POA: Diagnosis not present

## 2015-10-28 DIAGNOSIS — S72462D Displaced supracondylar fracture with intracondylar extension of lower end of left femur, subsequent encounter for closed fracture with routine healing: Secondary | ICD-10-CM | POA: Diagnosis not present

## 2015-11-02 DIAGNOSIS — S72462D Displaced supracondylar fracture with intracondylar extension of lower end of left femur, subsequent encounter for closed fracture with routine healing: Secondary | ICD-10-CM | POA: Diagnosis not present

## 2015-11-06 ENCOUNTER — Encounter: Payer: Self-pay | Admitting: Internal Medicine

## 2015-11-06 DIAGNOSIS — S72462D Displaced supracondylar fracture with intracondylar extension of lower end of left femur, subsequent encounter for closed fracture with routine healing: Secondary | ICD-10-CM | POA: Diagnosis not present

## 2015-11-06 DIAGNOSIS — D472 Monoclonal gammopathy: Secondary | ICD-10-CM | POA: Diagnosis not present

## 2015-11-09 DIAGNOSIS — S72462D Displaced supracondylar fracture with intracondylar extension of lower end of left femur, subsequent encounter for closed fracture with routine healing: Secondary | ICD-10-CM | POA: Diagnosis not present

## 2015-11-13 ENCOUNTER — Other Ambulatory Visit: Payer: Self-pay | Admitting: Internal Medicine

## 2015-11-13 ENCOUNTER — Telehealth: Payer: Self-pay | Admitting: Internal Medicine

## 2015-11-13 DIAGNOSIS — S72462D Displaced supracondylar fracture with intracondylar extension of lower end of left femur, subsequent encounter for closed fracture with routine healing: Secondary | ICD-10-CM | POA: Diagnosis not present

## 2015-11-13 NOTE — Telephone Encounter (Signed)
Reviewed labs from [facility]- Cbc-wnl; M-spike= 0.6+0.1 [immunofix not done]; IgA- 1578; IgG- 477; IgM= 18; k/l= 0.79 [0.26-1.65]; ca= 9.0. Pt sees me on 11/18/2015.

## 2015-11-16 ENCOUNTER — Other Ambulatory Visit: Payer: Self-pay | Admitting: *Deleted

## 2015-11-16 DIAGNOSIS — D472 Monoclonal gammopathy: Secondary | ICD-10-CM

## 2015-11-16 DIAGNOSIS — S72462D Displaced supracondylar fracture with intracondylar extension of lower end of left femur, subsequent encounter for closed fracture with routine healing: Secondary | ICD-10-CM | POA: Diagnosis not present

## 2015-11-18 ENCOUNTER — Inpatient Hospital Stay: Payer: Commercial Managed Care - HMO | Attending: Internal Medicine | Admitting: Internal Medicine

## 2015-11-18 ENCOUNTER — Encounter: Payer: Self-pay | Admitting: Internal Medicine

## 2015-11-18 ENCOUNTER — Other Ambulatory Visit: Payer: Commercial Managed Care - HMO

## 2015-11-18 DIAGNOSIS — E785 Hyperlipidemia, unspecified: Secondary | ICD-10-CM | POA: Insufficient documentation

## 2015-11-18 DIAGNOSIS — Z79899 Other long term (current) drug therapy: Secondary | ICD-10-CM | POA: Diagnosis not present

## 2015-11-18 DIAGNOSIS — Z86010 Personal history of colonic polyps: Secondary | ICD-10-CM | POA: Diagnosis not present

## 2015-11-18 DIAGNOSIS — Z7982 Long term (current) use of aspirin: Secondary | ICD-10-CM | POA: Insufficient documentation

## 2015-11-18 DIAGNOSIS — E559 Vitamin D deficiency, unspecified: Secondary | ICD-10-CM | POA: Insufficient documentation

## 2015-11-18 DIAGNOSIS — D472 Monoclonal gammopathy: Secondary | ICD-10-CM

## 2015-11-18 NOTE — Progress Notes (Signed)
[place] OFFICE PROGRESS NOTE  [name], [name]   SUMMARY OF ONCOLOGIC HISTORY:  # July 2016- MGUS: NOV 2016- M spike- 1gm/dl; IgA- 1898 [total]; K/L=N; AUG 2016- Skeletal survey-Normal    INTERVAL HISTORY:  This is my first interaction with the patient since I joined the practice September 2016. I reviewed the patient's prior charts/pertinent labs/imaging in detail; findings are summarized above.   75 year old female patient with above history of MGUS is here for follow-up. Denies any unusual bone pain. Her appetite is good. She is not losing any weight. She recently had hip surgery.   Patient has chronic tinnitus; evaluated by ENT.   REVIEW OF SYSTEMS:  A complete 10 point review of system is done which is negative except mentioned above/history of present illness.   PAST MEDICAL HISTORY :  Past Medical History  Diagnosis Date  . Change in bowel habits   . Rectal bleeding   . Colon polyps   . Osteoporosis   . Hyperlipidemia   . Vitamin D deficiency   . Seasonal allergies   . Postmenopausal   . Femur fracture, left ([place])     PAST SURGICAL HISTORY :   Past Surgical History  Procedure Laterality Date  . Tonsillectomy    . Breast biopsy Left 2009    negative  . Colonoscopy with propofol N/A 02/05/2015    Procedure: COLONOSCOPY WITH PROPOFOL;  Surgeon: Manya Silvas, MD;  Location: [facility] ENDOSCOPY;  Service: Endoscopy;  Laterality: N/A;  . Femur fracture surgery Left 04/18/2015    FAMILY HISTORY :   Family History  Problem Relation Age of Onset  . Breast cancer Cousin     paternal  . Kidney cancer Cousin     maternal  . Leukemia Cousin     maternal  . Stomach cancer      uncle  . Prostate cancer      grandfather  . Skin cancer      maternal grandparents    SOCIAL HISTORY:   Social History  Substance Use Topics  . Smoking status: Never Smoker   . Smokeless tobacco: Never Used  .  Alcohol Use: No    ALLERGIES:  is allergic to mold extract; codeine; indomethacin; other; sulfa antibiotics; nickel; and sulfamethoxazole-trimethoprim.  MEDICATIONS:  Current Outpatient Prescriptions  Medication Sig Dispense Refill  . aspirin 81 MG tablet Take 81 mg by mouth daily.    . Calcium Citrate-Vitamin D (CALCIUM CITRATE MAXIMUM/VIT D) 315-250 MG-UNIT TABS Take 1 tablet by mouth 2 (two) times daily.    Marland Kitchen ibuprofen (ADVIL,MOTRIN) 200 MG tablet Take 1 tablet by mouth as needed.    Marland Kitchen L-Lysine 500 MG TABS Take 2 tablets by mouth 4 (four) times daily as needed (mouth sores).     . losartan-hydrochlorothiazide (HYZAAR) 50-12.5 MG tablet Take 1 tablet by mouth daily.    . Vitamin D, Ergocalciferol, (DRISDOL) 50000 UNITS CAPS capsule Take 1 capsule by mouth once a week.     No current facility-administered medications for this visit.    PHYSICAL EXAMINATION:   There were no vitals taken for this visit.  There were no vitals filed for this visit.  GENERAL: Well-nourished well-developed; Alert, no distress and comfortable.  Alone; walking with a cane.  EYES: no pallor or icterus OROPHARYNX: no thrush or ulceration; good dentition  NECK: supple, no masses felt LYMPH:  no palpable lymphadenopathy in the cervical, axillary or inguinal regions LUNGS:  clear to auscultation and  No wheeze or crackles HEART/CVS: regular rate & rhythm and no murmurs; No lower extremity edema ABDOMEN:abdomen soft, non-tender and normal bowel sounds Musculoskeletal:no cyanosis of digits and no clubbing  PSYCH: alert & oriented x 3 with fluent speech NEURO: no focal motor/sensory deficits SKIN:  no rashes or significant lesions  LABORATORY DATA:  I have reviewed the data as listed    Component Value Date/Time   NA 143 04/28/2015 0555   NA 141 09/29/2011 1424   K 3.5 04/28/2015 0555   K 3.9 09/29/2011 1424   CL 108 04/28/2015 0555   CL 100 09/29/2011 1424   CO2 26 04/28/2015 0555   CO2 25  09/29/2011 1424   GLUCOSE 93 04/28/2015 0555   GLUCOSE 172* 09/29/2011 1424   BUN 10 04/28/2015 0555   BUN 19* 09/29/2011 1424   CREATININE 0.50 04/28/2015 0555   CREATININE 0.79 09/29/2011 1424   CALCIUM 8.2* 04/28/2015 0555   CALCIUM 10.1 09/29/2011 1424   PROT 6.1* 04/28/2015 0555   PROT 8.5* 09/29/2011 1424   ALBUMIN 2.8* 04/28/2015 0555   ALBUMIN 4.1 09/29/2011 1424   AST 24 04/28/2015 0555   AST 24 09/29/2011 1424   ALT 13* 04/28/2015 0555   ALT 12 09/29/2011 1424   ALKPHOS 64 04/28/2015 0555   ALKPHOS 76 09/29/2011 1424   BILITOT 0.9 04/28/2015 0555   BILITOT 0.8 09/29/2011 1424   GFRNONAA >60 04/28/2015 0555   GFRNONAA >60 09/29/2011 1424   GFRAA >60 04/28/2015 0555   GFRAA >60 09/29/2011 1424    No results found for: SPEP, UPEP  Lab Results  Component Value Date   WBC 6.0 04/28/2015   NEUTROABS 4.1 04/28/2015   HGB 8.6* 04/28/2015   HCT 26.8* 04/28/2015   MCV 91.8 04/28/2015   PLT 255 04/28/2015      Chemistry      Component Value Date/Time   NA 143 04/28/2015 0555   NA 141 09/29/2011 1424   K 3.5 04/28/2015 0555   K 3.9 09/29/2011 1424   CL 108 04/28/2015 0555   CL 100 09/29/2011 1424   CO2 26 04/28/2015 0555   CO2 25 09/29/2011 1424   BUN 10 04/28/2015 0555   BUN 19* 09/29/2011 1424   CREATININE 0.50 04/28/2015 0555   CREATININE 0.79 09/29/2011 1424      Component Value Date/Time   CALCIUM 8.2* 04/28/2015 0555   CALCIUM 10.1 09/29/2011 1424   ALKPHOS 64 04/28/2015 0555   ALKPHOS 76 09/29/2011 1424   AST 24 04/28/2015 0555   AST 24 09/29/2011 1424   ALT 13* 04/28/2015 0555   ALT 12 09/29/2011 1424   BILITOT 0.9 04/28/2015 0555   BILITOT 0.8 09/29/2011 1424        ASSESSMENT & PLAN:   # MGUS- most recent M protein November 2016 showed- M protein/spike of 1 g/dL. Kappa/lambda light chain normal limits. CBC CMP normal. I reviewed the natural history of MGUS; about 1% per year risk of transformation into multiple myeloma.  # I recommend  follow-up in 6 months cbc/ myeloma labs done through [facility] in 6 months prior to the visit.   # 15 minutes face-to-face with the patient discussing the above plan of care; more than 50% of time spent on natural history; counseling and coordination.    Cammie Sickle, MD 11/18/2015 4:31 PM

## 2015-11-18 NOTE — Progress Notes (Signed)
Pt is here to f/u on MGUS  And hypogammoglobulinemia. . She has no sx from this that she knows of. She is still getting PT for femur fx from aug. 2016.  She mentioned that she had ringing in ears and she went to see ENT and they told her they could not find anything. She has small amount of hearing loss.  She also states that she has dizziness on occasion and that she was givne exercises that made it better-she was told that a crystal is off in her head.  I told her she should share her concerns with PCP and if the exercises help she should cont. Them.

## 2015-11-20 DIAGNOSIS — S72462D Displaced supracondylar fracture with intracondylar extension of lower end of left femur, subsequent encounter for closed fracture with routine healing: Secondary | ICD-10-CM | POA: Diagnosis not present

## 2015-11-25 DIAGNOSIS — D485 Neoplasm of uncertain behavior of skin: Secondary | ICD-10-CM | POA: Diagnosis not present

## 2015-11-25 DIAGNOSIS — S72462D Displaced supracondylar fracture with intracondylar extension of lower end of left femur, subsequent encounter for closed fracture with routine healing: Secondary | ICD-10-CM | POA: Diagnosis not present

## 2015-11-25 DIAGNOSIS — D225 Melanocytic nevi of trunk: Secondary | ICD-10-CM | POA: Diagnosis not present

## 2015-11-30 DIAGNOSIS — S72462D Displaced supracondylar fracture with intracondylar extension of lower end of left femur, subsequent encounter for closed fracture with routine healing: Secondary | ICD-10-CM | POA: Diagnosis not present

## 2015-12-04 DIAGNOSIS — S72462D Displaced supracondylar fracture with intracondylar extension of lower end of left femur, subsequent encounter for closed fracture with routine healing: Secondary | ICD-10-CM | POA: Diagnosis not present

## 2015-12-09 DIAGNOSIS — S72462D Displaced supracondylar fracture with intracondylar extension of lower end of left femur, subsequent encounter for closed fracture with routine healing: Secondary | ICD-10-CM | POA: Diagnosis not present

## 2015-12-15 DIAGNOSIS — S72462D Displaced supracondylar fracture with intracondylar extension of lower end of left femur, subsequent encounter for closed fracture with routine healing: Secondary | ICD-10-CM | POA: Diagnosis not present

## 2015-12-17 DIAGNOSIS — M25462 Effusion, left knee: Secondary | ICD-10-CM | POA: Diagnosis not present

## 2015-12-17 DIAGNOSIS — S72402D Unspecified fracture of lower end of left femur, subsequent encounter for closed fracture with routine healing: Secondary | ICD-10-CM | POA: Diagnosis not present

## 2015-12-21 DIAGNOSIS — S72462D Displaced supracondylar fracture with intracondylar extension of lower end of left femur, subsequent encounter for closed fracture with routine healing: Secondary | ICD-10-CM | POA: Diagnosis not present

## 2015-12-23 DIAGNOSIS — S72462D Displaced supracondylar fracture with intracondylar extension of lower end of left femur, subsequent encounter for closed fracture with routine healing: Secondary | ICD-10-CM | POA: Diagnosis not present

## 2015-12-31 DIAGNOSIS — J069 Acute upper respiratory infection, unspecified: Secondary | ICD-10-CM | POA: Diagnosis not present

## 2016-01-11 ENCOUNTER — Other Ambulatory Visit: Payer: Self-pay | Admitting: Family Medicine

## 2016-01-11 DIAGNOSIS — Z1231 Encounter for screening mammogram for malignant neoplasm of breast: Secondary | ICD-10-CM

## 2016-02-02 ENCOUNTER — Ambulatory Visit
Admission: RE | Admit: 2016-02-02 | Discharge: 2016-02-02 | Disposition: A | Discharge status: Home / Self Care | Payer: Commercial Managed Care - HMO | Source: Ambulatory Visit | Attending: Family Medicine | Admitting: Family Medicine

## 2016-02-02 ENCOUNTER — Other Ambulatory Visit: Payer: Self-pay | Admitting: Family Medicine

## 2016-02-02 DIAGNOSIS — Z1231 Encounter for screening mammogram for malignant neoplasm of breast: Secondary | ICD-10-CM | POA: Diagnosis not present

## 2016-03-31 DIAGNOSIS — R42 Dizziness and giddiness: Secondary | ICD-10-CM | POA: Diagnosis not present

## 2016-03-31 DIAGNOSIS — K921 Melena: Secondary | ICD-10-CM | POA: Diagnosis not present

## 2016-04-12 DIAGNOSIS — R42 Dizziness and giddiness: Secondary | ICD-10-CM | POA: Diagnosis not present

## 2016-04-12 DIAGNOSIS — H8110 Benign paroxysmal vertigo, unspecified ear: Secondary | ICD-10-CM | POA: Diagnosis not present

## 2016-04-14 DIAGNOSIS — H8110 Benign paroxysmal vertigo, unspecified ear: Secondary | ICD-10-CM | POA: Diagnosis not present

## 2016-04-14 DIAGNOSIS — R42 Dizziness and giddiness: Secondary | ICD-10-CM | POA: Diagnosis not present

## 2016-04-18 DIAGNOSIS — R42 Dizziness and giddiness: Secondary | ICD-10-CM | POA: Diagnosis not present

## 2016-04-18 DIAGNOSIS — H8110 Benign paroxysmal vertigo, unspecified ear: Secondary | ICD-10-CM | POA: Diagnosis not present

## 2016-04-20 DIAGNOSIS — H8110 Benign paroxysmal vertigo, unspecified ear: Secondary | ICD-10-CM | POA: Diagnosis not present

## 2016-04-20 DIAGNOSIS — R42 Dizziness and giddiness: Secondary | ICD-10-CM | POA: Diagnosis not present

## 2016-04-25 DIAGNOSIS — R42 Dizziness and giddiness: Secondary | ICD-10-CM | POA: Diagnosis not present

## 2016-04-25 DIAGNOSIS — H8110 Benign paroxysmal vertigo, unspecified ear: Secondary | ICD-10-CM | POA: Diagnosis not present

## 2016-04-29 DIAGNOSIS — R42 Dizziness and giddiness: Secondary | ICD-10-CM | POA: Diagnosis not present

## 2016-04-29 DIAGNOSIS — H8110 Benign paroxysmal vertigo, unspecified ear: Secondary | ICD-10-CM | POA: Diagnosis not present

## 2016-05-06 ENCOUNTER — Encounter: Payer: Self-pay | Admitting: Internal Medicine

## 2016-05-06 DIAGNOSIS — R42 Dizziness and giddiness: Secondary | ICD-10-CM | POA: Diagnosis not present

## 2016-05-06 DIAGNOSIS — D472 Monoclonal gammopathy: Secondary | ICD-10-CM | POA: Diagnosis not present

## 2016-05-06 DIAGNOSIS — H8110 Benign paroxysmal vertigo, unspecified ear: Secondary | ICD-10-CM | POA: Diagnosis not present

## 2016-05-09 DIAGNOSIS — R42 Dizziness and giddiness: Secondary | ICD-10-CM | POA: Diagnosis not present

## 2016-05-09 DIAGNOSIS — H8110 Benign paroxysmal vertigo, unspecified ear: Secondary | ICD-10-CM | POA: Diagnosis not present

## 2016-05-10 ENCOUNTER — Other Ambulatory Visit: Payer: Self-pay | Admitting: Internal Medicine

## 2016-05-13 DIAGNOSIS — R42 Dizziness and giddiness: Secondary | ICD-10-CM | POA: Diagnosis not present

## 2016-05-13 DIAGNOSIS — H8110 Benign paroxysmal vertigo, unspecified ear: Secondary | ICD-10-CM | POA: Diagnosis not present

## 2016-05-25 ENCOUNTER — Encounter: Payer: Self-pay | Admitting: Internal Medicine

## 2016-05-25 ENCOUNTER — Encounter (INDEPENDENT_AMBULATORY_CARE_PROVIDER_SITE_OTHER): Payer: Self-pay

## 2016-05-25 ENCOUNTER — Inpatient Hospital Stay: Payer: Commercial Managed Care - HMO | Attending: Internal Medicine | Admitting: Internal Medicine

## 2016-05-25 DIAGNOSIS — Z86010 Personal history of colonic polyps: Secondary | ICD-10-CM | POA: Insufficient documentation

## 2016-05-25 DIAGNOSIS — E559 Vitamin D deficiency, unspecified: Secondary | ICD-10-CM | POA: Insufficient documentation

## 2016-05-25 DIAGNOSIS — D472 Monoclonal gammopathy: Secondary | ICD-10-CM | POA: Insufficient documentation

## 2016-05-25 DIAGNOSIS — E785 Hyperlipidemia, unspecified: Secondary | ICD-10-CM | POA: Diagnosis not present

## 2016-05-25 DIAGNOSIS — Z7982 Long term (current) use of aspirin: Secondary | ICD-10-CM | POA: Insufficient documentation

## 2016-05-25 DIAGNOSIS — M25562 Pain in left knee: Secondary | ICD-10-CM | POA: Diagnosis not present

## 2016-05-25 DIAGNOSIS — Z79899 Other long term (current) drug therapy: Secondary | ICD-10-CM | POA: Insufficient documentation

## 2016-05-25 NOTE — Progress Notes (Signed)
Pt reports her taste and smell has decreased otherwise no other concerns or changes.

## 2016-05-25 NOTE — Assessment & Plan Note (Addendum)
#  MGUS- IgA Lamda- M protein November 2016 showed- M protein/spike of 1 g/dL. SEP 2017- 0.9 gm/dl; kappa/lamda-N [[facility]]; CBC/CMP-N. again discussed with the patient that risk of multiple myeloma is small.  # I recommend follow-up in 12 months cbc/ myeloma labs done through [facility] in 12 months prior to the visit.   # 15 minutes face-to-face with the patient discussing the above plan of care; more than 50% of time spent on natural history; counseling and coordination.

## 2016-05-25 NOTE — Progress Notes (Signed)
[facility] OFFICE PROGRESS NOTE  [name], [name]   SUMMARY OF ONCOLOGIC HISTORY:  # July 2016- MGUS: NOV 2016- M spike- 1gm/dl; IgA- 1898 [total]; K/L=N; AUG 2016- Skeletal survey-Normal; SEP 2017- M spike- 0.9gm/dl; K/l-Normal. CBC/CMP=N.    INTERVAL HISTORY:  75 year old female patient with above history of MGUS is here for follow-up. Denies any unusual bone pain. Her appetite is good. She is not losing any weight. No new tingling and numbness.  Continues to have left knee pain.   REVIEW OF SYSTEMS:  A complete 10 point review of system is done which is negative except mentioned above/history of present illness.   PAST MEDICAL HISTORY :  Past Medical History:  Diagnosis Date  . Change in bowel habits   . Colon polyps   . Femur fracture, left ([place])   . Hyperlipidemia   . MGUS (monoclonal gammopathy of unknown significance)   . Osteoporosis   . Postmenopausal   . Rectal bleeding   . Seasonal allergies   . Vitamin D deficiency     PAST SURGICAL HISTORY :   Past Surgical History:  Procedure Laterality Date  . BREAST BIOPSY Left 2009   negative  . COLONOSCOPY WITH PROPOFOL N/A 02/05/2015   Procedure: COLONOSCOPY WITH PROPOFOL;  Surgeon: Manya Silvas, MD;  Location: [facility] ENDOSCOPY;  Service: Endoscopy;  Laterality: N/A;  . FEMUR FRACTURE SURGERY Left 04/18/2015  . TONSILLECTOMY      FAMILY HISTORY :   Family History  Problem Relation Age of Onset  . Breast cancer Cousin     paternal  . Kidney cancer Cousin     maternal  . Leukemia Cousin     maternal  . Stomach cancer      uncle  . Prostate cancer      grandfather  . Skin cancer      maternal grandparents    SOCIAL HISTORY:   Social History  Substance Use Topics  . Smoking status: Never Smoker  . Smokeless tobacco: Never Used  . Alcohol use No    ALLERGIES:  is allergic to mold extract [trichophyton]; codeine; indomethacin;  other; sulfa antibiotics; nickel; and sulfamethoxazole-trimethoprim.  MEDICATIONS:  Current Outpatient Prescriptions  Medication Sig Dispense Refill  . aspirin 81 MG tablet Take 81 mg by mouth daily.    . Calcium Citrate-Vitamin D (CALCIUM CITRATE MAXIMUM/VIT D) 315-250 MG-UNIT TABS Take 1 tablet by mouth 2 (two) times daily.    Marland Kitchen ibuprofen (ADVIL,MOTRIN) 200 MG tablet Take 1 tablet by mouth as needed.    Marland Kitchen L-Lysine 500 MG TABS Take 2 tablets by mouth 4 (four) times daily as needed (mouth sores).     . losartan-hydrochlorothiazide (HYZAAR) 50-12.5 MG tablet Take 1 tablet by mouth daily.    . Vitamin D, Ergocalciferol, (DRISDOL) 50000 UNITS CAPS capsule Take 1 capsule by mouth once a week.     No current facility-administered medications for this visit.     PHYSICAL EXAMINATION:   BP 130/80 (BP Location: Left Arm, Patient Position: Sitting)   Pulse 80   Temp 98.2 F (36.8 C) (Tympanic)   Resp 17   Ht 5\' 3"  (1.6 m)   Wt 171 lb 12.8 oz (77.9 kg)   BMI 30.43 kg/m   Filed Weights   05/25/16 1442  Weight: 171 lb 12.8 oz (77.9 kg)    GENERAL: Well-nourished well-developed; Alert, no distress and comfortable.  Alone; walking with a cane.  EYES:  no pallor or icterus OROPHARYNX: no thrush or ulceration; good dentition  NECK: supple, no masses felt LYMPH:  no palpable lymphadenopathy in the cervical, axillary or inguinal regions LUNGS: clear to auscultation and  No wheeze or crackles HEART/CVS: regular rate & rhythm and no murmurs; No lower extremity edema ABDOMEN:abdomen soft, non-tender and normal bowel sounds Musculoskeletal:no cyanosis of digits and no clubbing  PSYCH: alert & oriented x 3 with fluent speech NEURO: no focal motor/sensory deficits SKIN:  no rashes or significant lesions  LABORATORY DATA:  I have reviewed the data as listed    Component Value Date/Time   NA 143 04/28/2015 0555   NA 141 09/29/2011 1424   K 3.5 04/28/2015 0555   K 3.9 09/29/2011 1424   CL  108 04/28/2015 0555   CL 100 09/29/2011 1424   CO2 26 04/28/2015 0555   CO2 25 09/29/2011 1424   GLUCOSE 93 04/28/2015 0555   GLUCOSE 172 (H) 09/29/2011 1424   BUN 10 04/28/2015 0555   BUN 19 (H) 09/29/2011 1424   CREATININE 0.50 04/28/2015 0555   CREATININE 0.79 09/29/2011 1424   CALCIUM 8.2 (L) 04/28/2015 0555   CALCIUM 10.1 09/29/2011 1424   PROT 6.1 (L) 04/28/2015 0555   PROT 8.5 (H) 09/29/2011 1424   ALBUMIN 2.8 (L) 04/28/2015 0555   ALBUMIN 4.1 09/29/2011 1424   AST 24 04/28/2015 0555   AST 24 09/29/2011 1424   ALT 13 (L) 04/28/2015 0555   ALT 12 09/29/2011 1424   ALKPHOS 64 04/28/2015 0555   ALKPHOS 76 09/29/2011 1424   BILITOT 0.9 04/28/2015 0555   BILITOT 0.8 09/29/2011 1424   GFRNONAA >60 04/28/2015 0555   GFRNONAA >60 09/29/2011 1424   GFRAA >60 04/28/2015 0555   GFRAA >60 09/29/2011 1424    No results found for: SPEP, UPEP  Lab Results  Component Value Date   WBC 6.0 04/28/2015   NEUTROABS 4.1 04/28/2015   HGB 8.6 (L) 04/28/2015   HCT 26.8 (L) 04/28/2015   MCV 91.8 04/28/2015   PLT 255 04/28/2015      Chemistry      Component Value Date/Time   NA 143 04/28/2015 0555   NA 141 09/29/2011 1424   K 3.5 04/28/2015 0555   K 3.9 09/29/2011 1424   CL 108 04/28/2015 0555   CL 100 09/29/2011 1424   CO2 26 04/28/2015 0555   CO2 25 09/29/2011 1424   BUN 10 04/28/2015 0555   BUN 19 (H) 09/29/2011 1424   CREATININE 0.50 04/28/2015 0555   CREATININE 0.79 09/29/2011 1424      Component Value Date/Time   CALCIUM 8.2 (L) 04/28/2015 0555   CALCIUM 10.1 09/29/2011 1424   ALKPHOS 64 04/28/2015 0555   ALKPHOS 76 09/29/2011 1424   AST 24 04/28/2015 0555   AST 24 09/29/2011 1424   ALT 13 (L) 04/28/2015 0555   ALT 12 09/29/2011 1424   BILITOT 0.9 04/28/2015 0555   BILITOT 0.8 09/29/2011 1424        ASSESSMENT & PLAN:   MGUS (monoclonal gammopathy of unknown significance) # MGUS- IgA Lamda- M protein November 2016 showed- M protein/spike of 1 g/dL. SEP  2017- 0.9 gm/dl; kappa/lamda-N [[facility]]; CBC/CMP-N  # I recommend follow-up in 12 months cbc/ myeloma labs done through [facility] in 12 months prior to the visit.   # 15 minutes face-to-face with the patient discussing the above plan of care; more than 50% of time spent on natural history; counseling and coordination.  Cammie Sickle, MD 05/25/2016 4:24 PM

## 2016-05-26 DIAGNOSIS — S72402D Unspecified fracture of lower end of left femur, subsequent encounter for closed fracture with routine healing: Secondary | ICD-10-CM | POA: Diagnosis not present

## 2016-06-06 DIAGNOSIS — Z872 Personal history of diseases of the skin and subcutaneous tissue: Secondary | ICD-10-CM | POA: Diagnosis not present

## 2016-06-06 DIAGNOSIS — L218 Other seborrheic dermatitis: Secondary | ICD-10-CM | POA: Diagnosis not present

## 2016-06-06 DIAGNOSIS — Z1283 Encounter for screening for malignant neoplasm of skin: Secondary | ICD-10-CM | POA: Diagnosis not present

## 2016-07-19 DIAGNOSIS — T148XXA Other injury of unspecified body region, initial encounter: Secondary | ICD-10-CM | POA: Insufficient documentation

## 2016-07-20 DIAGNOSIS — Z0000 Encounter for general adult medical examination without abnormal findings: Secondary | ICD-10-CM | POA: Diagnosis not present

## 2016-07-20 DIAGNOSIS — R438 Other disturbances of smell and taste: Secondary | ICD-10-CM | POA: Diagnosis not present

## 2016-07-20 DIAGNOSIS — R197 Diarrhea, unspecified: Secondary | ICD-10-CM | POA: Diagnosis not present

## 2016-07-25 DIAGNOSIS — Z0000 Encounter for general adult medical examination without abnormal findings: Secondary | ICD-10-CM | POA: Diagnosis not present

## 2016-07-26 DIAGNOSIS — T8484XA Pain due to internal orthopedic prosthetic devices, implants and grafts, initial encounter: Secondary | ICD-10-CM | POA: Diagnosis not present

## 2016-07-26 DIAGNOSIS — T85848D Pain due to other internal prosthetic devices, implants and grafts, subsequent encounter: Secondary | ICD-10-CM | POA: Diagnosis not present

## 2016-07-26 DIAGNOSIS — M7989 Other specified soft tissue disorders: Secondary | ICD-10-CM | POA: Diagnosis not present

## 2016-07-26 DIAGNOSIS — Z6830 Body mass index (BMI) 30.0-30.9, adult: Secondary | ICD-10-CM | POA: Diagnosis not present

## 2016-07-26 DIAGNOSIS — Z79899 Other long term (current) drug therapy: Secondary | ICD-10-CM | POA: Diagnosis not present

## 2016-07-26 DIAGNOSIS — M25562 Pain in left knee: Secondary | ICD-10-CM | POA: Diagnosis not present

## 2016-08-05 DIAGNOSIS — R430 Anosmia: Secondary | ICD-10-CM | POA: Diagnosis not present

## 2016-08-05 DIAGNOSIS — J301 Allergic rhinitis due to pollen: Secondary | ICD-10-CM | POA: Diagnosis not present

## 2016-08-12 ENCOUNTER — Other Ambulatory Visit: Payer: Self-pay | Admitting: Otolaryngology

## 2016-08-12 DIAGNOSIS — R430 Anosmia: Secondary | ICD-10-CM

## 2016-08-26 ENCOUNTER — Ambulatory Visit: Payer: Commercial Managed Care - HMO

## 2016-08-26 ENCOUNTER — Other Ambulatory Visit: Payer: Self-pay | Admitting: Otolaryngology

## 2016-08-26 DIAGNOSIS — R430 Anosmia: Secondary | ICD-10-CM

## 2016-09-06 ENCOUNTER — Ambulatory Visit
Admission: RE | Admit: 2016-09-06 | Discharge: 2016-09-06 | Disposition: A | Discharge status: Home / Self Care | Payer: Medicare HMO | Source: Ambulatory Visit | Attending: Otolaryngology | Admitting: Otolaryngology

## 2016-09-06 DIAGNOSIS — Z8673 Personal history of transient ischemic attack (TIA), and cerebral infarction without residual deficits: Secondary | ICD-10-CM | POA: Insufficient documentation

## 2016-09-06 DIAGNOSIS — R430 Anosmia: Secondary | ICD-10-CM | POA: Insufficient documentation

## 2016-09-06 DIAGNOSIS — I639 Cerebral infarction, unspecified: Secondary | ICD-10-CM | POA: Diagnosis not present

## 2016-09-06 LAB — POCT I-STAT CREATININE: Creatinine, Ser: 0.7 mg/dL (ref 0.44–1.00)

## 2016-09-06 MED ORDER — GADOBENATE DIMEGLUMINE 529 MG/ML IV SOLN
15.0000 mL | Freq: Once | INTRAVENOUS | Status: AC | PRN
  Administered 2016-09-06: 15 mL via INTRAVENOUS

## 2016-09-09 DIAGNOSIS — H6122 Impacted cerumen, left ear: Secondary | ICD-10-CM | POA: Diagnosis not present

## 2016-09-09 DIAGNOSIS — H6062 Unspecified chronic otitis externa, left ear: Secondary | ICD-10-CM | POA: Insufficient documentation

## 2016-09-09 DIAGNOSIS — R430 Anosmia: Secondary | ICD-10-CM | POA: Diagnosis not present

## 2016-10-07 DIAGNOSIS — H2511 Age-related nuclear cataract, right eye: Secondary | ICD-10-CM | POA: Diagnosis not present

## 2016-10-24 DIAGNOSIS — R430 Anosmia: Secondary | ICD-10-CM | POA: Diagnosis not present

## 2016-10-24 DIAGNOSIS — Z8673 Personal history of transient ischemic attack (TIA), and cerebral infarction without residual deficits: Secondary | ICD-10-CM | POA: Diagnosis not present

## 2016-11-12 DIAGNOSIS — Z8673 Personal history of transient ischemic attack (TIA), and cerebral infarction without residual deficits: Secondary | ICD-10-CM | POA: Insufficient documentation

## 2016-11-12 DIAGNOSIS — R430 Anosmia: Secondary | ICD-10-CM | POA: Insufficient documentation

## 2016-11-30 ENCOUNTER — Encounter: Payer: Self-pay | Admitting: Physical Therapy

## 2016-11-30 ENCOUNTER — Ambulatory Visit: Payer: Medicare HMO | Attending: Neurology | Admitting: Physical Therapy

## 2016-11-30 DIAGNOSIS — R2681 Unsteadiness on feet: Secondary | ICD-10-CM | POA: Diagnosis not present

## 2016-11-30 DIAGNOSIS — M6281 Muscle weakness (generalized): Secondary | ICD-10-CM | POA: Insufficient documentation

## 2016-11-30 NOTE — Therapy (Signed)
[place] MAIN [facility] SERVICES [address] [place], [place], [address] Phone: [phone]   Fax:  [phone]  Physical Therapy Evaluation  Patient Details  Name: Kristin Wise MRN: [id number] Date of Birth: Nov 08, 1940 Referring Provider: Dr. Manuella Ghazi  Encounter Date: 11/30/2016      PT End of Session - 11/30/16 1049    Visit Number 1   Number of Visits 13   Date for PT Re-Evaluation 01/11/17   Authorization Type gcodes 1/10   Authorization Time Period $40 copay   PT Start Time 1002   PT Stop Time 1040   PT Time Calculation (min) 38 min   Activity Tolerance Patient tolerated treatment well   Behavior During Therapy [facility] for tasks assessed/performed      Past Medical History:  Diagnosis Date  . Change in bowel habits   . Colon polyps   . Femur fracture, left ([place])   . Hyperlipidemia   . Hypertension    controlled with medication;   . MGUS (monoclonal gammopathy of unknown significance)   . Osteoporosis    hasn't been checked in a while; unsure how severe;   . Postmenopausal   . Rectal bleeding   . Seasonal allergies   . Vitamin D deficiency     Past Surgical History:  Procedure Laterality Date  . BREAST BIOPSY Left 2009   negative  . COLONOSCOPY WITH PROPOFOL N/A 02/05/2015   Procedure: COLONOSCOPY WITH PROPOFOL;  Surgeon: Manya Silvas, MD;  Location: [facility] ENDOSCOPY;  Service: Endoscopy;  Laterality: N/A;  . FEMUR FRACTURE SURGERY Left 04/18/2015  . TONSILLECTOMY      There were no vitals filed for this visit.       Subjective Assessment - 11/30/16 1005    Subjective 76 yo Female reports increased difficulty with her balance since April 18, 2015. She fell and broke her femur, s/p ORIF. She reports having the screws removed in Dec 2017 and reports still having some stiffness in left leg. She reports that her left leg is better than it was but still gives her trouble; She does not use any assistive devices at this  time; She reports intermittent numbness/tingling in hands; Will have cramps in left leg which are painful; Denies any recent falls;    Pertinent History personal factors affecting rehab: increased risk for falls, history of left femur fracture; Osteoporosis; HTN (controlled); lives with husband who is supportive;    How long can you sit comfortably? NA   How long can you stand comfortably? 30+ min;    How long can you walk comfortably? >500 feet without difficulty;    Diagnostic tests MRI in January shows Old small LEFT cerebellar infarct (unsure of when this happened)   Patient Stated Goals improve balance, movement, reduce unsteadiness;    Currently in Pain? No/denies            [facility] PT Assessment - 11/30/16 0001      Assessment   Medical Diagnosis Impaired balance   Referring Provider Dr. Manuella Ghazi   Onset Date/Surgical Date --  Apr 18, 2015   Hand Dominance Right   Next MD Visit May 2018   Prior Therapy has had outpatient PT following left femur fracture; reports good results from past rehab; does go to [facility] and rides recumbent bike (about 15-20 min)     Precautions   Precautions Fall     Restrictions   Weight Bearing Restrictions No     Balance Screen  Has the patient fallen in the past 6 months No   Has the patient had a decrease in activity level because of a fear of falling?  Yes   Is the patient reluctant to leave their home because of a fear of falling?  Yes     Home Environment   Additional Comments lives in single story home, 8 steps to enter house with 1 Rail; will negotiate steps mostly one step at a time with heavy use of UE; mod I for self care ADLs;      Prior Function   Level of Independence Independent;Independent with gait;Independent with transfers   Vocation Retired   Leisure play bridge with friends, shopping, visit family, going to [place] in summer;      Cognition   Overall Cognitive Status Within Functional Limits for tasks assessed   Memory --   going for memory test in May 2018; unsure about memory   Awareness Appears intact     Observation/Other Assessments   Observations very polite woman;    Activities of Balance Confidence Scale (ABC Scale)  62.5% (impaired balance)     Sensation   Light Touch Appears Intact   Proprioception Appears Intact     Coordination   Gross Motor Movements are Fluid and Coordinated Yes   Fine Motor Movements are Fluid and Coordinated Yes   Finger Nose Finger Test accurate bilaterally;     Posture/Postural Control   Posture Comments demonstrates forward posture with prolonged standing, decreased lumbar lordosis, uneven shoulder height;      AROM   Overall AROM Comments BUE and BLE are [facility]     Strength   Overall Strength Comments BUE are 4+/5, RLE: grossly 4+/5, LLE: hip 4/5, knee ext: 4/5, flex: 4-/5, ankle 4/5     Palpation   Palpation comment mild tenderness to left medial knee due to past femur injury;      Transfers   Comments able to transfer sit<>Stand without pushing on chair;      Ambulation/Gait   Gait Comments ambulates on even surface independently, forward flexed posture, decreased step length, slightly slower gait speed, lateral trunk sway, narrow base of support;      Standardized Balance Assessment   Five times sit to stand comments  14 sec without HHA (<15 sec indicates low fall risk)   10 Meter Walk 0.95 m/s without AD (community ambulator)     Furniture conservator/restorer   Sit to Stand Able to stand without using hands and stabilize independently   Standing Unsupported Able to stand safely 2 minutes   Sitting with Back Unsupported but Feet Supported on Floor or Stool Able to sit safely and securely 2 minutes   Stand to Sit Sits safely with minimal use of hands   Transfers Able to transfer safely, minor use of hands   Standing Unsupported with Eyes Closed Able to stand 10 seconds safely   Standing Ubsupported with Feet Together Able to place feet together independently and  stand 1 minute safely   From Standing, Reach Forward with Outstretched Arm Can reach confidently >25 cm (10")   From Standing Position, Pick up Object from Floor Able to pick up shoe safely and easily   From Standing Position, Turn to Look Behind Over each Shoulder Looks behind from both sides and weight shifts well   Turn 360 Degrees Able to turn 360 degrees safely but slowly   Standing Unsupported, Alternately Place Feet on Step/Stool Able to stand independently and safely and  complete 8 steps in 20 seconds   Standing Unsupported, One Foot in Front Able to plae foot ahead of the other independently and hold 30 seconds   Standing on One Leg Able to lift leg independently and hold 5-10 seconds   Total Score 52   Berg comment: 50% risk for falls;      Functional Gait  Assessment   Gait Level Surface Walks 20 ft in less than 5.5 sec, no assistive devices, good speed, no evidence for imbalance, normal gait pattern, deviates no more than 6 in outside of the 12 in walkway width.   Change in Gait Speed Able to smoothly change walking speed without loss of balance or gait deviation. Deviate no more than 6 in outside of the 12 in walkway width.   Gait with Horizontal Head Turns Performs head turns smoothly with no change in gait. Deviates no more than 6 in outside 12 in walkway width   Gait with Vertical Head Turns Performs task with slight change in gait velocity (eg, minor disruption to smooth gait path), deviates 6 - 10 in outside 12 in walkway width or uses assistive device   Gait and Pivot Turn Pivot turns safely in greater than 3 sec and stops with no loss of balance, or pivot turns safely within 3 sec and stops with mild imbalance, requires small steps to catch balance.   Step Over Obstacle Is able to step over one shoe box (4.5 in total height) without changing gait speed. No evidence of imbalance.   Gait with Narrow Base of Support Ambulates 7-9 steps.   Gait with Eyes Closed Walks 20 ft, slow  speed, abnormal gait pattern, evidence for imbalance, deviates 10-15 in outside 12 in walkway width. Requires more than 9 sec to ambulate 20 ft.   Ambulating Backwards Walks 20 ft, uses assistive device, slower speed, mild gait deviations, deviates 6-10 in outside 12 in walkway width.   Steps Two feet to a stair, must use rail.   Total Score 21   FGA comment: <23 indicates increased risk for falls;                            PT Education - 11/30/16 1049    Education provided Yes   Education Details recommendations, plan of care;    Person(s) Educated Patient   Methods Explanation   Comprehension Verbalized understanding             PT Long Term Goals - 11/30/16 1055      PT LONG TERM GOAL #1   Title Patient will be independent in home exercise program to improve strength/mobility for better functional independence with ADLs.   Time 6   Period Weeks   Status New     PT LONG TERM GOAL #2   Title Patient will increase ABC scale score >80% to demonstrate better functional mobility and better confidence with ADLs.    Time 6   Period Weeks   Status New     PT LONG TERM GOAL #3   Title Patient will increase Functional Gait Assessment score to >23/30 as to reduce fall risk and improve dynamic gait safety with community ambulation.   Time 6   Period Weeks   Status New     PT LONG TERM GOAL #4   Title Patient will increase BLE gross strength to 4+/5 as to improve functional strength for independent gait, increased standing tolerance and increased ADL  ability.   Time 6   Period Weeks   Status New               Plan - 11/30/16 1050    Clinical Impression Statement 76 yo Female reports feeling unsteady when walking and with turning. She reports having a big fall in Aug 2016 with left femur fracture. She is s/p ORIF and reports that her left leg, even though is better, does not feel like it used to. She is currently walking independently without AD. She  did test at an increased risk for falls with functional gait assessment. However other outcome measures were negative for fall risk. She does demonstrate some weakness in left leg. She would benefit from skilled PT intervention to improve dynamic balance and functional mobility; She is concerned about her co-pay and is going to call around to other rehab facilities before pursuing PT at our clinic.    Rehab Potential Good   Clinical Impairments Affecting Rehab Potential positive: good results from previous PT, walking independently, good caregiver support; Negative: Co-morbidities, stress; Patient's clinical presentation is stable as she has not had any recent falls;    PT Frequency 2x / week   PT Duration 6 weeks   PT Treatment/Interventions Aquatic Therapy;Cryotherapy;Moist Heat;Electrical Stimulation;Therapeutic exercise;Therapeutic activities;Functional mobility training;Stair training;Gait training;Balance training;Neuromuscular re-education;Patient/family education;Manual techniques;Energy conservation;Passive range of motion;Vestibular   PT Next Visit Plan work on dynamic balance;   PT Home Exercise Plan will address next visit;    Consulted and Agree with Plan of Care Patient      Patient will benefit from skilled therapeutic intervention in order to improve the following deficits and impairments:  Abnormal gait, Postural dysfunction, Decreased mobility, Decreased activity tolerance, Decreased endurance, Decreased strength, Impaired flexibility, Difficulty walking, Decreased safety awareness, Decreased balance  Visit Diagnosis: Muscle weakness (generalized) - Plan: PT plan of care cert/re-cert  Unsteadiness on feet - Plan: PT plan of care cert/re-cert      G-Codes - 62/13/08 1057    Functional Assessment Tool Used (Outpatient Only) FGA, Berg, 10 meter, 5 times sit<>Stand   Functional Limitation Mobility: Walking and moving around   Mobility: Walking and Moving Around Current Status  [phone]) At least 20 percent but less than 40 percent impaired, limited or restricted   Mobility: Walking and Moving Around Goal Status [phone]) At least 1 percent but less than 20 percent impaired, limited or restricted       Problem List Patient Active Problem List   Diagnosis Date Noted  . MGUS (monoclonal gammopathy of unknown significance) 05/25/2016  . HLD (hyperlipidemia) 04/08/2015  . OP (osteoporosis) 04/08/2015  . Allergic rhinitis, seasonal 04/08/2015  . Avitaminosis D 04/08/2015  . Bleeding per rectum 01/12/2015  . Altered bowel function 01/12/2015  . Family history of colonic polyps 01/12/2015    Laveta Gilkey PT, DPT 11/30/2016, 10:58 AM  [place] MAIN [facility] SERVICES [address] [place], [place], [address] Phone: [phone]   Fax:  [phone]  Name: RAYSSA ATHA MRN: [id number] Date of Birth: 11-May-1941

## 2016-12-05 ENCOUNTER — Ambulatory Visit: Payer: Commercial Managed Care - HMO | Admitting: Physical Therapy

## 2016-12-07 ENCOUNTER — Ambulatory Visit: Payer: Commercial Managed Care - HMO

## 2016-12-12 ENCOUNTER — Ambulatory Visit: Payer: Commercial Managed Care - HMO | Admitting: Physical Therapy

## 2016-12-14 ENCOUNTER — Ambulatory Visit: Payer: Commercial Managed Care - HMO | Admitting: Physical Therapy

## 2016-12-14 DIAGNOSIS — R2681 Unsteadiness on feet: Secondary | ICD-10-CM | POA: Diagnosis not present

## 2016-12-14 DIAGNOSIS — R262 Difficulty in walking, not elsewhere classified: Secondary | ICD-10-CM | POA: Diagnosis not present

## 2016-12-16 DIAGNOSIS — R262 Difficulty in walking, not elsewhere classified: Secondary | ICD-10-CM | POA: Diagnosis not present

## 2016-12-16 DIAGNOSIS — R2681 Unsteadiness on feet: Secondary | ICD-10-CM | POA: Diagnosis not present

## 2016-12-19 ENCOUNTER — Ambulatory Visit: Payer: Commercial Managed Care - HMO | Admitting: Physical Therapy

## 2016-12-19 DIAGNOSIS — R262 Difficulty in walking, not elsewhere classified: Secondary | ICD-10-CM | POA: Diagnosis not present

## 2016-12-19 DIAGNOSIS — R2681 Unsteadiness on feet: Secondary | ICD-10-CM | POA: Diagnosis not present

## 2016-12-21 ENCOUNTER — Ambulatory Visit: Payer: Commercial Managed Care - HMO | Admitting: Physical Therapy

## 2016-12-21 ENCOUNTER — Encounter: Payer: Self-pay | Admitting: Physical Therapy

## 2016-12-21 DIAGNOSIS — R2681 Unsteadiness on feet: Secondary | ICD-10-CM

## 2016-12-21 DIAGNOSIS — M6281 Muscle weakness (generalized): Secondary | ICD-10-CM

## 2016-12-21 NOTE — Therapy (Signed)
[place] MAIN [place] [facility] SERVICES [address] [place], [place], [address] Phone: [phone]   Fax:  [phone]  December 21, 2016   _0 @  Physical Therapy Discharge Summary  Patient: Kristin Wise  MRN: [id number]  Date of Birth: 1941/01/06   Diagnosis: Muscle weakness (generalized)  Unsteadiness on feet Referring Provider: Dr. Manuella Ghazi  The above patient had been seen in Physical Therapy for evaluation on 11/30/16;   Subjective: She called and cancelled remaining appointments as she could go to another clinic cheaper. Patient did not want to pay a high copay at our clinic.    No Goals Met    Sincerely,   Trotter,Margaret, PT DPT   CC _1 @  [place] [address] [place], [place], [address] Phone: [phone]   Fax:  [phone]  Patient: Kristin Wise  MRN: [id number]  Date of Birth: [birth year]

## 2016-12-23 DIAGNOSIS — R262 Difficulty in walking, not elsewhere classified: Secondary | ICD-10-CM | POA: Diagnosis not present

## 2016-12-23 DIAGNOSIS — R2681 Unsteadiness on feet: Secondary | ICD-10-CM | POA: Diagnosis not present

## 2016-12-26 ENCOUNTER — Ambulatory Visit: Payer: Commercial Managed Care - HMO | Admitting: Physical Therapy

## 2016-12-28 ENCOUNTER — Ambulatory Visit: Payer: Commercial Managed Care - HMO | Admitting: Physical Therapy

## 2016-12-28 DIAGNOSIS — R262 Difficulty in walking, not elsewhere classified: Secondary | ICD-10-CM | POA: Diagnosis not present

## 2016-12-28 DIAGNOSIS — R2681 Unsteadiness on feet: Secondary | ICD-10-CM | POA: Diagnosis not present

## 2017-01-02 ENCOUNTER — Ambulatory Visit: Payer: Commercial Managed Care - HMO | Admitting: Physical Therapy

## 2017-01-02 DIAGNOSIS — Z872 Personal history of diseases of the skin and subcutaneous tissue: Secondary | ICD-10-CM | POA: Diagnosis not present

## 2017-01-02 DIAGNOSIS — L821 Other seborrheic keratosis: Secondary | ICD-10-CM | POA: Diagnosis not present

## 2017-01-02 DIAGNOSIS — Z86018 Personal history of other benign neoplasm: Secondary | ICD-10-CM | POA: Diagnosis not present

## 2017-01-02 DIAGNOSIS — R2681 Unsteadiness on feet: Secondary | ICD-10-CM | POA: Diagnosis not present

## 2017-01-02 DIAGNOSIS — R262 Difficulty in walking, not elsewhere classified: Secondary | ICD-10-CM | POA: Diagnosis not present

## 2017-01-04 ENCOUNTER — Ambulatory Visit: Payer: Commercial Managed Care - HMO | Admitting: Physical Therapy

## 2017-01-04 DIAGNOSIS — R262 Difficulty in walking, not elsewhere classified: Secondary | ICD-10-CM | POA: Diagnosis not present

## 2017-01-04 DIAGNOSIS — R2681 Unsteadiness on feet: Secondary | ICD-10-CM | POA: Diagnosis not present

## 2017-01-09 ENCOUNTER — Ambulatory Visit: Payer: Commercial Managed Care - HMO | Admitting: Physical Therapy

## 2017-01-09 DIAGNOSIS — R2681 Unsteadiness on feet: Secondary | ICD-10-CM | POA: Diagnosis not present

## 2017-01-09 DIAGNOSIS — R262 Difficulty in walking, not elsewhere classified: Secondary | ICD-10-CM | POA: Diagnosis not present

## 2017-01-11 ENCOUNTER — Ambulatory Visit: Payer: Commercial Managed Care - HMO | Admitting: Physical Therapy

## 2017-01-23 DIAGNOSIS — R262 Difficulty in walking, not elsewhere classified: Secondary | ICD-10-CM | POA: Diagnosis not present

## 2017-01-23 DIAGNOSIS — R2681 Unsteadiness on feet: Secondary | ICD-10-CM | POA: Diagnosis not present

## 2017-01-25 DIAGNOSIS — R262 Difficulty in walking, not elsewhere classified: Secondary | ICD-10-CM | POA: Diagnosis not present

## 2017-01-25 DIAGNOSIS — R2681 Unsteadiness on feet: Secondary | ICD-10-CM | POA: Diagnosis not present

## 2017-01-30 DIAGNOSIS — R262 Difficulty in walking, not elsewhere classified: Secondary | ICD-10-CM | POA: Diagnosis not present

## 2017-01-30 DIAGNOSIS — R2681 Unsteadiness on feet: Secondary | ICD-10-CM | POA: Diagnosis not present

## 2017-02-02 DIAGNOSIS — R2681 Unsteadiness on feet: Secondary | ICD-10-CM | POA: Diagnosis not present

## 2017-02-02 DIAGNOSIS — R262 Difficulty in walking, not elsewhere classified: Secondary | ICD-10-CM | POA: Diagnosis not present

## 2017-02-07 DIAGNOSIS — R262 Difficulty in walking, not elsewhere classified: Secondary | ICD-10-CM | POA: Diagnosis not present

## 2017-02-07 DIAGNOSIS — R2681 Unsteadiness on feet: Secondary | ICD-10-CM | POA: Diagnosis not present

## 2017-02-08 DIAGNOSIS — R262 Difficulty in walking, not elsewhere classified: Secondary | ICD-10-CM | POA: Diagnosis not present

## 2017-02-08 DIAGNOSIS — R2681 Unsteadiness on feet: Secondary | ICD-10-CM | POA: Diagnosis not present

## 2017-02-13 DIAGNOSIS — R2681 Unsteadiness on feet: Secondary | ICD-10-CM | POA: Diagnosis not present

## 2017-02-13 DIAGNOSIS — R262 Difficulty in walking, not elsewhere classified: Secondary | ICD-10-CM | POA: Diagnosis not present

## 2017-02-15 ENCOUNTER — Other Ambulatory Visit: Payer: Self-pay | Admitting: Family Medicine

## 2017-02-15 DIAGNOSIS — Z1231 Encounter for screening mammogram for malignant neoplasm of breast: Secondary | ICD-10-CM

## 2017-02-17 DIAGNOSIS — R2681 Unsteadiness on feet: Secondary | ICD-10-CM | POA: Diagnosis not present

## 2017-02-17 DIAGNOSIS — R262 Difficulty in walking, not elsewhere classified: Secondary | ICD-10-CM | POA: Diagnosis not present

## 2017-02-21 DIAGNOSIS — R262 Difficulty in walking, not elsewhere classified: Secondary | ICD-10-CM | POA: Diagnosis not present

## 2017-02-21 DIAGNOSIS — R2681 Unsteadiness on feet: Secondary | ICD-10-CM | POA: Diagnosis not present

## 2017-02-22 DIAGNOSIS — R2689 Other abnormalities of gait and mobility: Secondary | ICD-10-CM | POA: Diagnosis not present

## 2017-02-22 DIAGNOSIS — Z8673 Personal history of transient ischemic attack (TIA), and cerebral infarction without residual deficits: Secondary | ICD-10-CM | POA: Diagnosis not present

## 2017-02-22 DIAGNOSIS — R430 Anosmia: Secondary | ICD-10-CM | POA: Diagnosis not present

## 2017-02-24 DIAGNOSIS — R2681 Unsteadiness on feet: Secondary | ICD-10-CM | POA: Diagnosis not present

## 2017-02-24 DIAGNOSIS — R262 Difficulty in walking, not elsewhere classified: Secondary | ICD-10-CM | POA: Diagnosis not present

## 2017-02-28 ENCOUNTER — Ambulatory Visit
Admission: RE | Admit: 2017-02-28 | Discharge: 2017-02-28 | Disposition: A | Discharge status: Home / Self Care | Payer: Medicare HMO | Source: Ambulatory Visit | Attending: Family Medicine | Admitting: Family Medicine

## 2017-02-28 DIAGNOSIS — N6310 Unspecified lump in the right breast, unspecified quadrant: Secondary | ICD-10-CM | POA: Diagnosis not present

## 2017-02-28 DIAGNOSIS — Z1231 Encounter for screening mammogram for malignant neoplasm of breast: Secondary | ICD-10-CM | POA: Diagnosis not present

## 2017-02-28 DIAGNOSIS — R928 Other abnormal and inconclusive findings on diagnostic imaging of breast: Secondary | ICD-10-CM | POA: Diagnosis not present

## 2017-03-03 ENCOUNTER — Other Ambulatory Visit: Payer: Self-pay | Admitting: Family Medicine

## 2017-03-03 DIAGNOSIS — N6310 Unspecified lump in the right breast, unspecified quadrant: Secondary | ICD-10-CM

## 2017-03-03 DIAGNOSIS — R928 Other abnormal and inconclusive findings on diagnostic imaging of breast: Secondary | ICD-10-CM

## 2017-03-06 DIAGNOSIS — R262 Difficulty in walking, not elsewhere classified: Secondary | ICD-10-CM | POA: Diagnosis not present

## 2017-03-06 DIAGNOSIS — R2681 Unsteadiness on feet: Secondary | ICD-10-CM | POA: Diagnosis not present

## 2017-03-10 ENCOUNTER — Ambulatory Visit
Admission: RE | Admit: 2017-03-10 | Discharge: 2017-03-10 | Disposition: A | Discharge status: Home / Self Care | Payer: Medicare HMO | Source: Ambulatory Visit | Attending: Family Medicine | Admitting: Family Medicine

## 2017-03-10 DIAGNOSIS — R928 Other abnormal and inconclusive findings on diagnostic imaging of breast: Secondary | ICD-10-CM

## 2017-03-10 DIAGNOSIS — N6310 Unspecified lump in the right breast, unspecified quadrant: Secondary | ICD-10-CM

## 2017-03-10 DIAGNOSIS — N6001 Solitary cyst of right breast: Secondary | ICD-10-CM | POA: Diagnosis not present

## 2017-03-10 DIAGNOSIS — R262 Difficulty in walking, not elsewhere classified: Secondary | ICD-10-CM | POA: Diagnosis not present

## 2017-03-10 DIAGNOSIS — R2681 Unsteadiness on feet: Secondary | ICD-10-CM | POA: Diagnosis not present

## 2017-03-13 IMAGING — MR MR HEAD WO/W CM
7 of 11 series · 28 of 48 positions shown · IV contrast (multihance)
Comparison: None.

CLINICAL DATA: Lower tooth abscess for 6 months, change in taste
and smell. Evaluate anosmia.

EXAM:
MRI HEAD WITHOUT AND WITH CONTRAST
TECHNIQUE: Multiplanar, multiecho pulse sequences of the brain and surrounding
structures were obtained without and with intravenous contrast.
Olfactory protocol
CONTRAST:  15mL MULTIHANCE GADOBENATE DIMEGLUMINE 529 MG/ML IV SOLN

[Series 4: DWI · axial · 4.0mm · 0.94mm/px · z∈[-64,+107]mm · 5 of 44 slices shown]
[im 1/44]
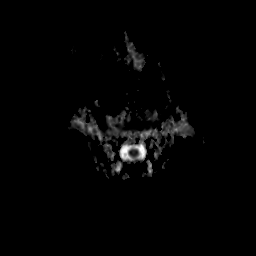
[im 11/44]
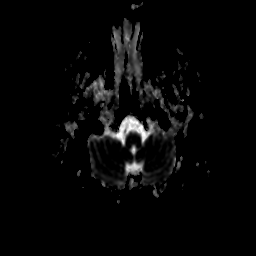
[im 22/44]
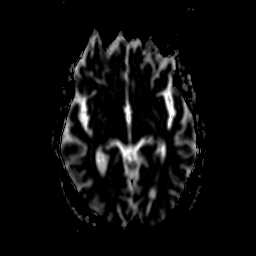
[im 33/44]
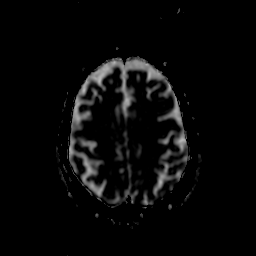
[im 44/44]
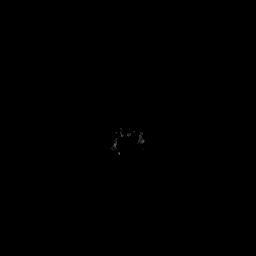

[Series 6: T2 · axial · 5.0mm · 0.45mm/px · z∈[-52,+103]mm · 3 of 25 slices shown (1 of 2)]
[im 1/25]
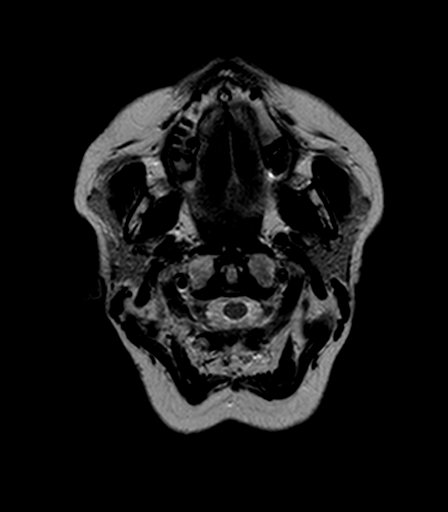
[im 13/25]
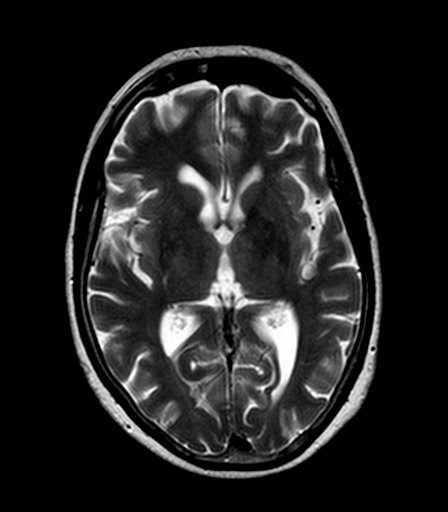
[im 25/25]
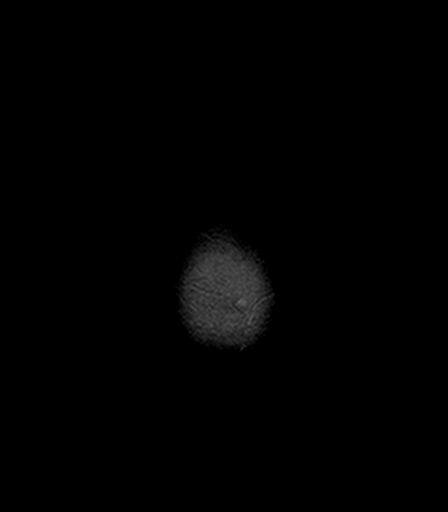

[Series 7: FLAIR · axial · 5.0mm · 0.90mm/px · z∈[-52,+103]mm · 3 of 25 slices shown]
[im 1/25]
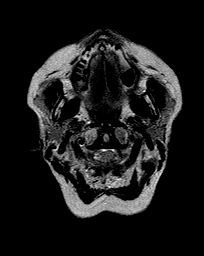
[im 13/25]
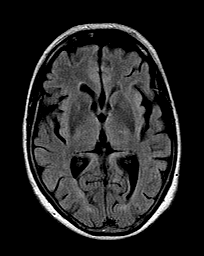
[im 25/25]
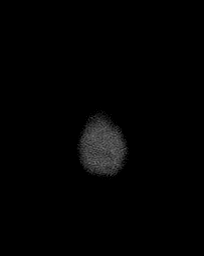

[Series 8: T2 · axial · 5.0mm · 0.45mm/px · z∈[-52,+103]mm · 3 of 25 slices shown (2 of 2)]
[im 1/25]
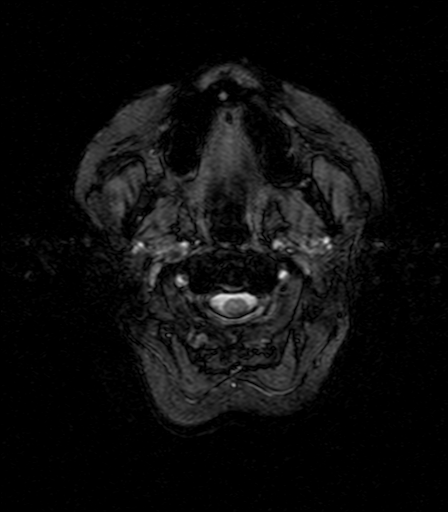
[im 13/25]
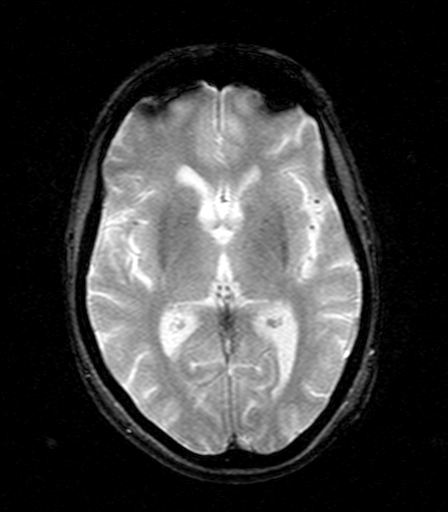
[im 25/25]
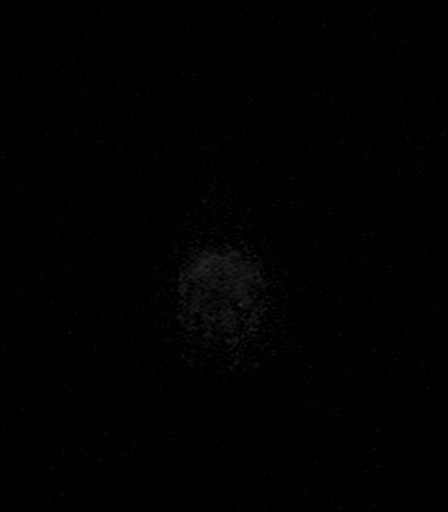

[Series 9: T2 fat-sat · coronal · 3.0mm · 0.45mm/px · 3 of 40 slices shown]
[im 1/40]
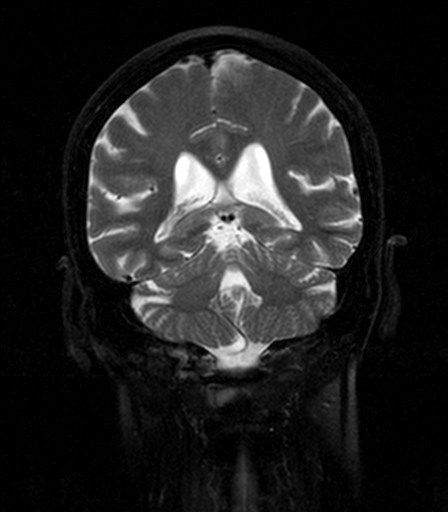
[im 10/40]
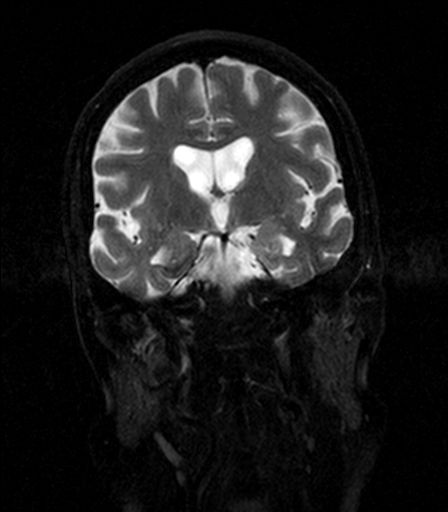
[im 20/40]
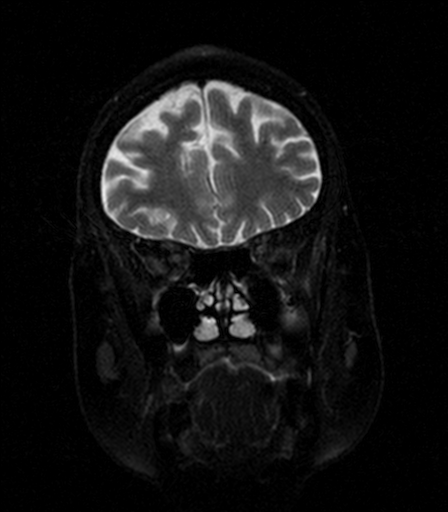

[Series 12: T1 post-contrast · axial · 3.0mm · 0.45mm/px · z∈[-68,+120]mm · 7 of 64 slices shown (1 of 2)]
[im 1/64]
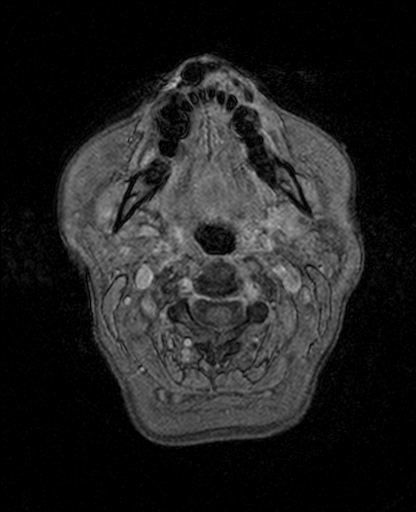
[im 11/64]
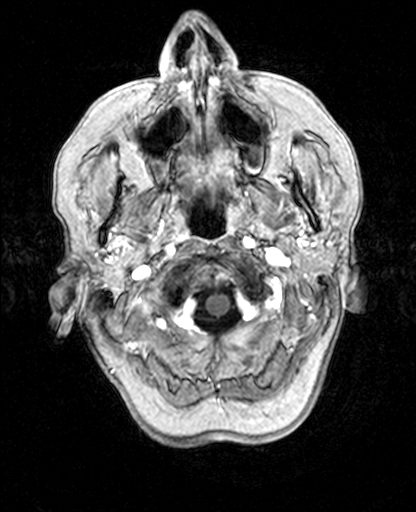
[im 22/64]
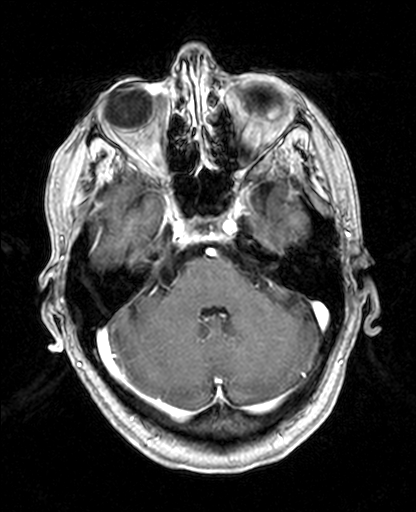
[im 32/64]
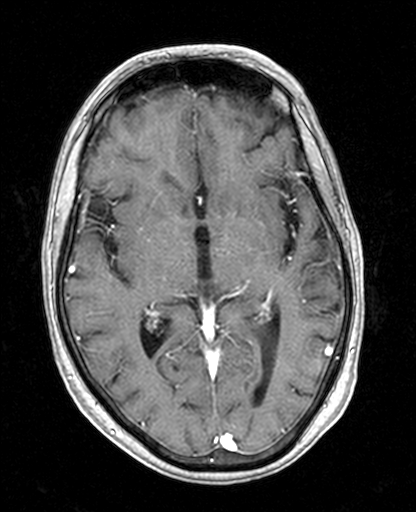
[im 43/64]
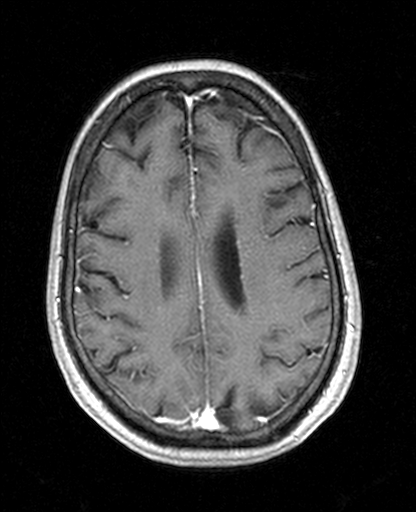
[im 53/64]
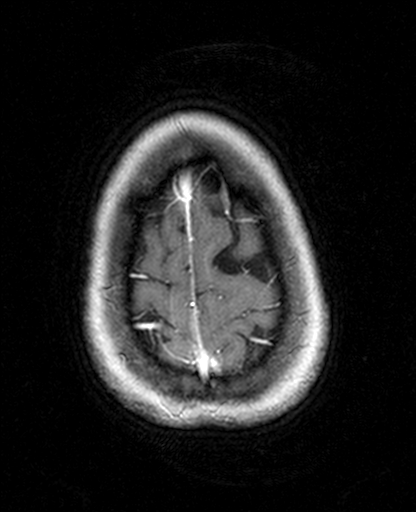
[im 64/64]
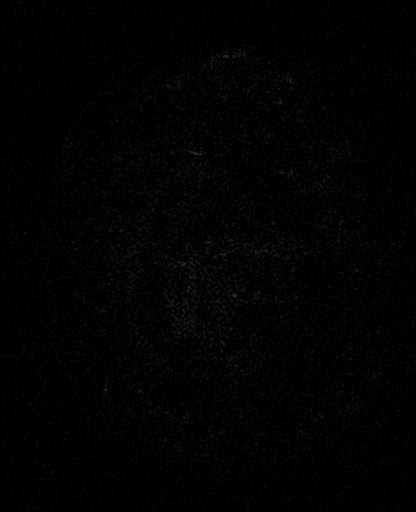

[Series 13: T1 post-contrast · coronal · 5.0mm · 0.45mm/px · 4 of 31 slices shown (2 of 2)]
[im 1/31]
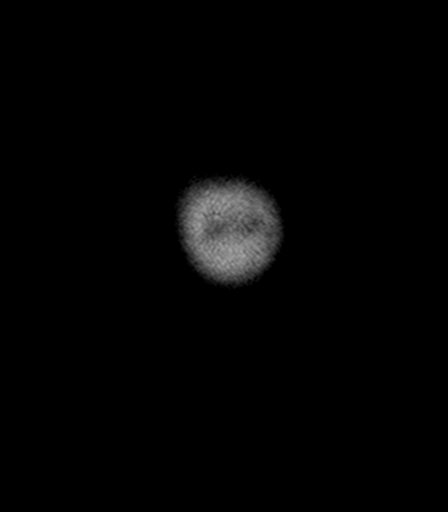
[im 11/31]
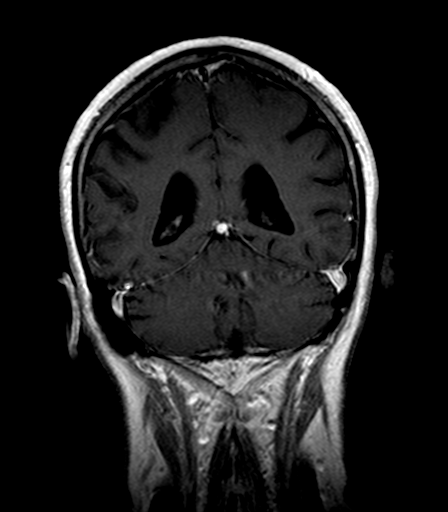
[im 21/31]
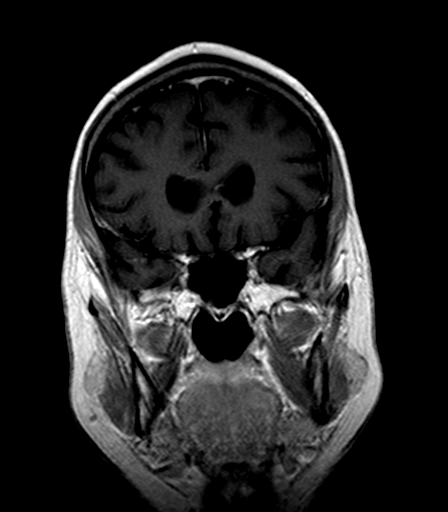
[im 31/31]
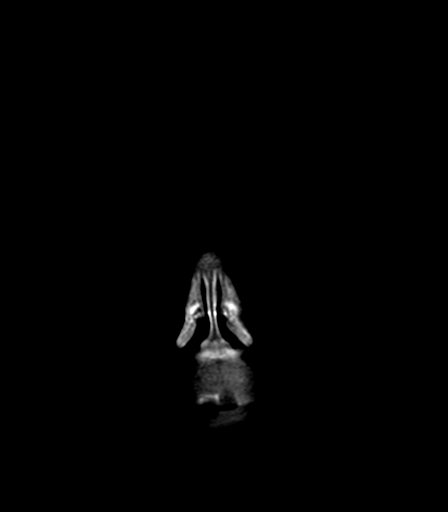

[28 of 48 positions shown; findings below may reference images not displayed]

FINDINGS: INTRACRANIAL CONTENTS: No reduced diffusion to suggest acute
ischemia. No susceptibility artifact to suggest hemorrhage. The
ventricles and sulci are normal for patient's age. No suspicious
parenchymal signal, masses, mass effect. Old small LEFT cerebellar
infarct. No abnormal intraparenchymal or extra-axial enhancement.
Normal appearance of the olfactory grooves, no abnormal signal or
enhancement along the course of the first cranial nerves. No
abnormal extra-axial fluid collections. No extra-axial masses. 3 mm
pineal cyst.

VASCULAR: Normal major intracranial vascular flow voids present at
skull base.

SKULL AND UPPER CERVICAL SPINE: No abnormal sellar expansion. No
suspicious calvarial bone marrow signal. Craniocervical junction
maintained.

SINUSES/ORBITS: Trace ethmoid mucosal thickening. Mastoid air cells
are well aerated. The included ocular globes and orbital contents
are non-suspicious.

OTHER: None.
IMPRESSION: Old small LEFT cerebellar infarct. Otherwise negative MRI of the
head with and without contrast.

## 2017-03-14 DIAGNOSIS — R2681 Unsteadiness on feet: Secondary | ICD-10-CM | POA: Diagnosis not present

## 2017-03-14 DIAGNOSIS — R262 Difficulty in walking, not elsewhere classified: Secondary | ICD-10-CM | POA: Diagnosis not present

## 2017-03-17 DIAGNOSIS — R262 Difficulty in walking, not elsewhere classified: Secondary | ICD-10-CM | POA: Diagnosis not present

## 2017-03-17 DIAGNOSIS — R2681 Unsteadiness on feet: Secondary | ICD-10-CM | POA: Diagnosis not present

## 2017-03-22 DIAGNOSIS — R2681 Unsteadiness on feet: Secondary | ICD-10-CM | POA: Diagnosis not present

## 2017-03-22 DIAGNOSIS — R262 Difficulty in walking, not elsewhere classified: Secondary | ICD-10-CM | POA: Diagnosis not present

## 2017-03-31 DIAGNOSIS — R2681 Unsteadiness on feet: Secondary | ICD-10-CM | POA: Diagnosis not present

## 2017-03-31 DIAGNOSIS — R262 Difficulty in walking, not elsewhere classified: Secondary | ICD-10-CM | POA: Diagnosis not present

## 2017-05-09 DIAGNOSIS — D472 Monoclonal gammopathy: Secondary | ICD-10-CM | POA: Diagnosis not present

## 2017-05-24 ENCOUNTER — Inpatient Hospital Stay: Payer: Medicare HMO | Attending: Internal Medicine | Admitting: Internal Medicine

## 2017-05-24 VITALS — BP 118/72 | HR 81 | Temp 97.7°F | Resp 16 | Wt 178.0 lb

## 2017-05-24 DIAGNOSIS — Z7982 Long term (current) use of aspirin: Secondary | ICD-10-CM | POA: Diagnosis not present

## 2017-05-24 DIAGNOSIS — D472 Monoclonal gammopathy: Secondary | ICD-10-CM | POA: Diagnosis not present

## 2017-05-24 DIAGNOSIS — I10 Essential (primary) hypertension: Secondary | ICD-10-CM | POA: Diagnosis not present

## 2017-05-24 DIAGNOSIS — E559 Vitamin D deficiency, unspecified: Secondary | ICD-10-CM | POA: Insufficient documentation

## 2017-05-24 DIAGNOSIS — E785 Hyperlipidemia, unspecified: Secondary | ICD-10-CM | POA: Insufficient documentation

## 2017-05-24 DIAGNOSIS — Z86010 Personal history of colonic polyps: Secondary | ICD-10-CM | POA: Insufficient documentation

## 2017-05-24 DIAGNOSIS — Z79899 Other long term (current) drug therapy: Secondary | ICD-10-CM | POA: Insufficient documentation

## 2017-05-24 NOTE — Progress Notes (Signed)
Kristin Wise OFFICE PROGRESS NOTE  [name], [name]   SUMMARY OF ONCOLOGIC HISTORY:  # July 2016- MGUS: NOV 2016- M spike- 1gm/dl; IgA- 1898 [total]; K/L=N; AUG 2016- Skeletal survey-Normal; SEP 2017- M spike- 0.9gm/dl; K/l-Normal. CBC/CMP=N.   # [place]- ? [Dr.Shah; 2018]   INTERVAL HISTORY:  76 year old female patient with above history of MGUS is here for follow-up. Denies any unusual bone pain. Her appetite is good. She is not losing any weight. Patient has mild tingling and numbness of the bilateral upper extremity is.  Continues to have left knee pain. Denies any nausea vomiting chest pain or shortness of breath or cough.  REVIEW OF SYSTEMS:  A complete 10 point review of system is done which is negative except mentioned above/history of present illness.   PAST MEDICAL HISTORY :  Past Medical History:  Diagnosis Date  . Change in bowel habits   . Colon polyps   . Femur fracture, left ([place])   . Hyperlipidemia   . Hypertension    controlled with medication;   . MGUS (monoclonal gammopathy of unknown significance)   . Osteoporosis    hasn't been checked in a while; unsure how severe;   . Postmenopausal   . Rectal bleeding   . Seasonal allergies   . Vitamin D deficiency     PAST SURGICAL HISTORY :   Past Surgical History:  Procedure Laterality Date  . BREAST BIOPSY Left 2009   negative  . COLONOSCOPY WITH PROPOFOL N/A 02/05/2015   Procedure: COLONOSCOPY WITH PROPOFOL;  Surgeon: Manya Silvas, MD;  Location[facility] END[facility]COPY;  Service: Endoscopy;  Laterality: N/A;  . FEMUR FRACTURE SURGERY Left 04/18/2015  . TONSILLECTOMY      FAMILY HISTORY :   Family History  Problem Relation Age of Onset  . Breast cancer Cousin        paternal  . Kidney cancer Cousin        maternal  . Leukemia Cousin        maternal  . Stomach cancer Unknown        uncle  . Prostate cancer Unknown        grandfather   . Skin cancer Unknown        maternal grandparents    SOCIAL HISTORY:   Social History  Substance Use Topics  . Smoking status: Never Smoker  . Smokeless tobacco: Never Used  . Alcohol use No    ALLERGIES:  is allergic to mold extract [trichophyton]; codeine; indomethacin; other; sulfa antibiotics; nickel; and sulfamethoxazole-trimethoprim.  MEDICATIONS:  Current Outpatient Prescriptions  Medication Sig Dispense Refill  . aspirin 325 MG tablet Take 81 mg by mouth daily.    Marland Kitchen atorvastatin (LIPITOR) 10 MG tablet Take by mouth.    . Calcium Citrate-Vitamin D (CALCIUM CITRATE MAXIMUM/VIT D) 315-250 MG-UNIT TABS Take 1 tablet by mouth 2 (two) times daily.    Marland Kitchen ibuprofen (ADVIL,MOTRIN) 200 MG tablet Take 1 tablet by mouth as needed.    Marland Kitchen L-Lysine 500 MG TABS Take 2 tablets by mouth 4 (four) times daily as needed (mouth sores).     . losartan-hydrochlorothiazide (HYZAAR) 50-12.5 MG tablet Take 1 tablet by mouth daily.    . Vitamin D, Ergocalciferol, (DRISDOL) 50000 UNITS CAPS capsule Take 1 capsule by mouth once a week.     No current facility-administered medications for this visit.     PHYSICAL EXAMINATION:   BP 118/72 (BP Location: Left  Arm, Patient Position: Sitting)   Pulse 81   Temp 97.7 F (36.5 C) (Tympanic)   Resp 16   Wt 178 lb (80.7 kg)   BMI 31.53 kg/m   Filed Weights   05/24/17 1341  Weight: 178 lb (80.7 kg)    GENERAL: Well-nourished well-developed; Alert, no distress and comfortable.  Alone; walking with a cane.  EYES: no pallor or icterus OROPHARYNX: no thrush or ulceration; good dentition  NECK: supple, no masses felt LYMPH:  no palpable lymphadenopathy in the cervical, axillary or inguinal regions LUNGS: clear to auscultation and  No wheeze or crackles HEART/CVS: regular rate & rhythm and no murmurs; No lower extremity edema ABDOMEN:abdomen soft, non-tender and normal bowel sounds Musculoskeletal:no cyanosis of digits and no clubbing  PSYCH: alert &  oriented x 3 with fluent speech NEURO: no focal motor/sensory deficits SKIN:  no rashes or significant lesions  LABORATORY DATA:  I have reviewed the data as listed    Component Value Date/Time   NA 143 04/28/2015 0555   NA 141 09/29/2011 1424   K 3.5 04/28/2015 0555   K 3.9 09/29/2011 1424   CL 108 04/28/2015 0555   CL 100 09/29/2011 1424   CO2 26 04/28/2015 0555   CO2 25 09/29/2011 1424   GLUCOSE 93 04/28/2015 0555   GLUCOSE 172 (H) 09/29/2011 1424   BUN 10 04/28/2015 0555   BUN 19 (H) 09/29/2011 1424   CREATININE 0.70 09/06/2016 1438   CREATININE 0.79 09/29/2011 1424   CALCIUM 8.2 (L) 04/28/2015 0555   CALCIUM 10.1 09/29/2011 1424   PROT 6.1 (L) 04/28/2015 0555   PROT 8.5 (H) 09/29/2011 1424   ALBUMIN 2.8 (L) 04/28/2015 0555   ALBUMIN 4.1 09/29/2011 1424   AST 24 04/28/2015 0555   AST 24 09/29/2011 1424   ALT 13 (L) 04/28/2015 0555   ALT 12 09/29/2011 1424   ALKPHOS 64 04/28/2015 0555   ALKPHOS 76 09/29/2011 1424   BILITOT 0.9 04/28/2015 0555   BILITOT 0.8 09/29/2011 1424   GFRNONAA >60 04/28/2015 0555   GFRNONAA >60 09/29/2011 1424   GFRAA >60 04/28/2015 0555   GFRAA >60 09/29/2011 1424    No results found for: SPEP, UPEP  Lab Results  Component Value Date   WBC 6.0 04/28/2015   NEUTROABS 4.1 04/28/2015   HGB 8.6 (L) 04/28/2015   HCT 26.8 (L) 04/28/2015   MCV 91.8 04/28/2015   PLT 255 04/28/2015      Chemistry      Component Value Date/Time   NA 143 04/28/2015 0555   NA 141 09/29/2011 1424   K 3.5 04/28/2015 0555   K 3.9 09/29/2011 1424   CL 108 04/28/2015 0555   CL 100 09/29/2011 1424   CO2 26 04/28/2015 0555   CO2 25 09/29/2011 1424   BUN 10 04/28/2015 0555   BUN 19 (H) 09/29/2011 1424   CREATININE 0.70 09/06/2016 1438   CREATININE 0.79 09/29/2011 1424      Component Value Date/Time   CALCIUM 8.2 (L) 04/28/2015 0555   CALCIUM 10.1 09/29/2011 1424   ALKPHOS 64 04/28/2015 0555   ALKPHOS 76 09/29/2011 1424   AST 24 04/28/2015 0555   AST  24 09/29/2011 1424   ALT 13 (L) 04/28/2015 0555   ALT 12 09/29/2011 1424   BILITOT 0.9 04/28/2015 0555   BILITOT 0.8 09/29/2011 1424        ASSESSMENT & PLAN:   MGUS (monoclonal gammopathy of unknown significance) # MGUS- IgA Lamda- M protein November  2016 showed- M protein/spike of 1 g/dL. SEP 2018- 0.9 [additional 0.1gm/dal kappa/lamda-N [[facility]]; CBC/CMP-N. Again discussed with the patient that risk of multiple myeloma is small.  # Tingling/numbess bil UE- ? Carpal tunnel vs others.MGUS possible- but no good treatment options if related to MGUS. Encouraged to discuss with  Dr.Shah [sec to Stroke]  # I recommend follow-up in 12 months cbc/ myeloma labs done through [facility] in 12 months prior to the visit.    Cammie Sickle, MD 05/24/2017 7:15 PM

## 2017-05-24 NOTE — Assessment & Plan Note (Addendum)
#  MGUS- IgA Lamda- M protein November 2016 showed- M protein/spike of 1 g/dL. SEP 2018- 0.9 [additional 0.1gm/dal kappa/lamda-N [[facility]]; CBC/CMP-N. Again discussed with the patient that risk of multiple myeloma is small.  # Tingling/numbess bil UE- ? Carpal tunnel vs others.MGUS possible- but no good treatment options if related to MGUS. Encouraged to discuss with  Dr.Shah [sec to Stroke]  # I recommend follow-up in 12 months cbc/ myeloma labs done through [facility] in 12 months prior to the visit.

## 2017-05-25 ENCOUNTER — Ambulatory Visit: Payer: Commercial Managed Care - HMO | Admitting: Internal Medicine

## 2017-05-30 ENCOUNTER — Encounter: Payer: Self-pay | Admitting: Internal Medicine

## 2017-07-17 DIAGNOSIS — I10 Essential (primary) hypertension: Secondary | ICD-10-CM | POA: Diagnosis not present

## 2017-07-17 DIAGNOSIS — E785 Hyperlipidemia, unspecified: Secondary | ICD-10-CM | POA: Diagnosis not present

## 2017-07-17 DIAGNOSIS — E559 Vitamin D deficiency, unspecified: Secondary | ICD-10-CM | POA: Diagnosis not present

## 2017-07-24 DIAGNOSIS — M810 Age-related osteoporosis without current pathological fracture: Secondary | ICD-10-CM | POA: Diagnosis not present

## 2017-07-24 DIAGNOSIS — Z0000 Encounter for general adult medical examination without abnormal findings: Secondary | ICD-10-CM | POA: Diagnosis not present

## 2017-08-08 DIAGNOSIS — M810 Age-related osteoporosis without current pathological fracture: Secondary | ICD-10-CM | POA: Diagnosis not present

## 2017-08-08 DIAGNOSIS — R430 Anosmia: Secondary | ICD-10-CM | POA: Diagnosis not present

## 2017-08-08 DIAGNOSIS — Z8673 Personal history of transient ischemic attack (TIA), and cerebral infarction without residual deficits: Secondary | ICD-10-CM | POA: Diagnosis not present

## 2017-08-08 DIAGNOSIS — R2689 Other abnormalities of gait and mobility: Secondary | ICD-10-CM | POA: Diagnosis not present

## 2017-11-27 DIAGNOSIS — H2511 Age-related nuclear cataract, right eye: Secondary | ICD-10-CM | POA: Diagnosis not present

## 2018-01-11 DIAGNOSIS — M810 Age-related osteoporosis without current pathological fracture: Secondary | ICD-10-CM | POA: Diagnosis not present

## 2018-01-11 DIAGNOSIS — K219 Gastro-esophageal reflux disease without esophagitis: Secondary | ICD-10-CM | POA: Diagnosis not present

## 2018-01-12 ENCOUNTER — Other Ambulatory Visit: Payer: Self-pay | Admitting: Family Medicine

## 2018-01-12 DIAGNOSIS — Z1231 Encounter for screening mammogram for malignant neoplasm of breast: Secondary | ICD-10-CM

## 2018-02-06 DIAGNOSIS — C44712 Basal cell carcinoma of skin of right lower limb, including hip: Secondary | ICD-10-CM | POA: Diagnosis not present

## 2018-02-06 DIAGNOSIS — L821 Other seborrheic keratosis: Secondary | ICD-10-CM | POA: Diagnosis not present

## 2018-02-06 DIAGNOSIS — D485 Neoplasm of uncertain behavior of skin: Secondary | ICD-10-CM | POA: Diagnosis not present

## 2018-02-06 DIAGNOSIS — L578 Other skin changes due to chronic exposure to nonionizing radiation: Secondary | ICD-10-CM | POA: Diagnosis not present

## 2018-02-06 DIAGNOSIS — Z86018 Personal history of other benign neoplasm: Secondary | ICD-10-CM | POA: Diagnosis not present

## 2018-02-12 DIAGNOSIS — R430 Anosmia: Secondary | ICD-10-CM | POA: Diagnosis not present

## 2018-02-12 DIAGNOSIS — Z8673 Personal history of transient ischemic attack (TIA), and cerebral infarction without residual deficits: Secondary | ICD-10-CM | POA: Diagnosis not present

## 2018-02-12 DIAGNOSIS — R2689 Other abnormalities of gait and mobility: Secondary | ICD-10-CM | POA: Diagnosis not present

## 2018-02-20 DIAGNOSIS — C44712 Basal cell carcinoma of skin of right lower limb, including hip: Secondary | ICD-10-CM | POA: Diagnosis not present

## 2018-03-21 ENCOUNTER — Ambulatory Visit
Admission: RE | Admit: 2018-03-21 | Discharge: 2018-03-21 | Disposition: A | Discharge status: Home / Self Care | Payer: Medicare HMO | Source: Ambulatory Visit | Attending: Family Medicine | Admitting: Family Medicine

## 2018-03-21 DIAGNOSIS — Z1231 Encounter for screening mammogram for malignant neoplasm of breast: Secondary | ICD-10-CM | POA: Insufficient documentation

## 2018-03-21 HISTORY — DX: Malignant (primary) neoplasm, unspecified: C80.1

## 2018-04-02 ENCOUNTER — Other Ambulatory Visit: Payer: Self-pay | Admitting: Family Medicine

## 2018-04-02 DIAGNOSIS — R928 Other abnormal and inconclusive findings on diagnostic imaging of breast: Secondary | ICD-10-CM

## 2018-04-03 ENCOUNTER — Ambulatory Visit
Admission: RE | Admit: 2018-04-03 | Discharge: 2018-04-03 | Disposition: A | Discharge status: Home / Self Care | Payer: Medicare HMO | Source: Ambulatory Visit | Attending: Family Medicine | Admitting: Family Medicine

## 2018-04-03 DIAGNOSIS — R928 Other abnormal and inconclusive findings on diagnostic imaging of breast: Secondary | ICD-10-CM | POA: Diagnosis not present

## 2018-04-03 DIAGNOSIS — N6489 Other specified disorders of breast: Secondary | ICD-10-CM | POA: Diagnosis not present

## 2018-04-05 ENCOUNTER — Encounter: Payer: Self-pay | Admitting: *Deleted

## 2018-04-09 ENCOUNTER — Encounter: Payer: Self-pay | Admitting: General Surgery

## 2018-04-09 ENCOUNTER — Ambulatory Visit: Payer: Medicare HMO | Admitting: General Surgery

## 2018-04-09 VITALS — BP 128/75 | HR 72 | Ht 65.0 in | Wt 178.0 lb

## 2018-04-09 DIAGNOSIS — R590 Localized enlarged lymph nodes: Secondary | ICD-10-CM | POA: Insufficient documentation

## 2018-04-09 NOTE — Patient Instructions (Signed)
Patient to return in three months for left breast ultrasound at [facility].  The patient is aware to call back for any questions or concerns.

## 2018-04-09 NOTE — Progress Notes (Signed)
Patient ID: Kristin Wise, female   DOB: Sep 21, 1940, 77 y.o.   MRN: [id number]  Chief Complaint  Patient presents with  . Other    HPI Kristin Wise is a 77 y.o. female who presents for a breast evaluation. The most recent mammogram was done on 03/21/2018 and ultrasound on 04/03/2018. Patient had a second shingle X on 02/20/2018, she states her left arm was very swollen based bar the day following the injection.  It took about four- five days for the swelling to resolve.  Mammograms were obtained about 2 weeks after the injection. The patient had not had any reaction to the first injection obtained in April 2019. Patient does perform regular self breast checks and gets regular mammograms done.    Patient was concerned that the titanium clip to the time of her 2010 biopsy could be responsible for her present symptoms.  Considering the numerous normal exam she has had in the interval between 2009 and the present makes this unlikely.  She is also by report had titanium used to fixate her femur fracture, which would be a far more reactive source than the titanium clip from a breast biopsy.  The patient saw Carloyn Manner, M.D. in 2018 for a marked diminution and taste and smell resulted in MRI.  This showed an old small left cerebellar infarct.  The patient was not aware of any previous neurologic events.  HPI  Past Medical History:  Diagnosis Date  . Cancer ([place])    basal cell  . Change in bowel habits   . Colon polyps   . Femur fracture, left ([place])   . Hyperlipidemia   . Hypertension    controlled with medication;   . MGUS (monoclonal gammopathy of unknown significance)   . Osteoporosis    hasn't been checked in a while; unsure how severe;   . Postmenopausal   . Rectal bleeding   . Seasonal allergies   . Stroke [facility]) ??  . Vitamin D deficiency     Past Surgical History:  Procedure Laterality Date  . BREAST BIOPSY Left 09/30/2008   Patient presented with acute swelling  in the breast, aspiration culture negative.  Subsequent core biopsy showing fragments of cyst wall with granulation tissue, fibrosis and focal residual epithelial lining.  No malignancy.  . COLONOSCOPY WITH PROPOFOL N/A 02/05/2015   Procedure: COLONOSCOPY WITH PROPOFOL;  Surgeon: Manya Silvas, MD;  Location: [facility] ENDOSCOPY;  Service: Endoscopy;  Laterality: N/A;  . FEMUR FRACTURE SURGERY Left 04/18/2015  . TONSILLECTOMY      Family History  Problem Relation Age of Onset  . Breast cancer Cousin        paternal  . Kidney cancer Cousin        maternal  . Leukemia Cousin        maternal  . Stomach cancer Unknown        uncle  . Prostate cancer Unknown        grandfather  . Skin cancer Unknown        maternal grandparents    Social History Social History   Tobacco Use  . Smoking status: Never Smoker  . Smokeless tobacco: Never Used  Substance Use Topics  . Alcohol use: No  . Drug use: No    Allergies  Allergen Reactions  . Mold Extract [Trichophyton] Other (See Comments)    Allergy to it.   . Codeine     "Feels bad"  . Indomethacin     "Bad  dreams"  . Other     Sodium Laurel Sulfate- caused gums to peel, corners of mouth became sore  . Sulfa Antibiotics Hives    Feels terrible   . Nickel Rash  . Sulfamethoxazole-Trimethoprim Rash    Current Outpatient Medications  Medication Sig Dispense Refill  . aspirin 325 MG tablet Take 81 mg by mouth daily.    Marland Kitchen atorvastatin (LIPITOR) 10 MG tablet Take 10 mg by mouth.     . Calcium Citrate-Vitamin D (CALCIUM CITRATE MAXIMUM/VIT D) 315-250 MG-UNIT TABS Take 1 tablet by mouth 2 (two) times daily.    Marland Kitchen ibuprofen (ADVIL,MOTRIN) 200 MG tablet Take 1 tablet by mouth as needed.    Marland Kitchen L-Lysine 500 MG TABS Take 2 tablets by mouth 4 (four) times daily as needed (mouth sores).     . losartan-hydrochlorothiazide (HYZAAR) 50-12.5 MG tablet Take 1 tablet by mouth daily.    . Vitamin D, Ergocalciferol, (DRISDOL) 50000 UNITS CAPS capsule  Take 1 capsule by mouth once a week.     No current facility-administered medications for this visit.     Review of Systems Review of Systems  Constitutional: Negative.   Respiratory: Negative.   Cardiovascular: Negative.     Blood pressure 128/75, pulse 72, height 5\' 5"  (1.651 m), weight 178 lb (80.7 kg).  Physical Exam Physical Exam  Constitutional: She is oriented to person, place, and time. She appears well-developed and well-nourished.  Eyes: Conjunctivae are normal. No scleral icterus.  Neck: Neck supple.  Cardiovascular: Normal rate, regular rhythm and normal heart sounds.  Pulmonary/Chest: Effort normal and breath sounds normal. Right breast exhibits no inverted nipple, no mass, no nipple discharge, no skin change and no tenderness. Left breast exhibits no inverted nipple, no mass, no nipple discharge, no skin change and no tenderness.  Abdominal: Soft. Bowel sounds are normal. There is no hepatosplenomegaly. There is no tenderness.  Lymphadenopathy:    She has no cervical adenopathy.    She has no axillary adenopathy (no palpable adenopathy).       Right: No inguinal and no supraclavicular adenopathy present.       Left: No inguinal and no supraclavicular adenopathy present.  Neurological: She is alert and oriented to person, place, and time.  Skin: Skin is warm and dry.    Data Reviewed Screening mammograms of March 22, 2018 and ultrasound of April 03, 2018 reviewed.  Interval increase in axillary adenopathy from 2018 when a right breast diagnostic study and ultrasound showed retroareolar cystic structures.  Cortical thickening noted.  Ultrasound-guided biopsy recommended.  BI-RADS-4.  Assessment    Reactive lymphadenopathy secondary to shingle X injection.    Plan  With the time course reported by the patient of marked swelling within the day of the injection lasting for 4-5 days and resolving only within 10 days or less of her follow-up mammograms I suspect that  this is a reactive lymphadenopathy.  Based on today's unremarkable clinical exam, absence of "B" symptoms, unexplained weight loss, fever or chills, I think observation is reasonable rather than proceeding to biopsy.  The patient will be meeting with her medical oncologist next month in regards to her "M spike", and if he feels differently we can discuss arranging for biopsy.  A follow-up ultrasound in 3 months should begin to show resolution of the lymph nodes although they may never return to normal size. Patient to return in three months for left breast ultrasound at [facility].  The patient is aware to call back for  any questions or concerns.  HPI, Physical Exam, Assessment and Plan have been scribed under the direction and in the presence of Hervey Ard, MD.  Gaspar Cola, CMA  I have completed the exam and reviewed the above documentation for accuracy and completeness.  I agree with the above.  Haematologist has been used and any errors in dictation or transcription are unintentional.  Hervey Ard, M.D., F.A.C.S.  Forest Gleason Younes Degeorge 04/09/2018, 4:13 PM

## 2018-05-23 ENCOUNTER — Ambulatory Visit: Payer: Medicare HMO | Admitting: Internal Medicine

## 2018-05-25 ENCOUNTER — Other Ambulatory Visit: Payer: Self-pay

## 2018-05-25 DIAGNOSIS — R590 Localized enlarged lymph nodes: Secondary | ICD-10-CM

## 2018-06-21 DIAGNOSIS — D472 Monoclonal gammopathy: Secondary | ICD-10-CM | POA: Diagnosis not present

## 2018-06-22 ENCOUNTER — Encounter: Payer: Self-pay | Admitting: Internal Medicine

## 2018-06-27 ENCOUNTER — Encounter: Payer: Self-pay | Admitting: Internal Medicine

## 2018-07-02 ENCOUNTER — Other Ambulatory Visit: Payer: Self-pay

## 2018-07-02 ENCOUNTER — Inpatient Hospital Stay: Payer: Medicare HMO | Attending: Internal Medicine | Admitting: Internal Medicine

## 2018-07-02 VITALS — BP 119/72 | HR 89 | Temp 96.4°F | Resp 18 | Wt 179.8 lb

## 2018-07-02 DIAGNOSIS — D472 Monoclonal gammopathy: Secondary | ICD-10-CM | POA: Diagnosis not present

## 2018-07-02 DIAGNOSIS — Z7982 Long term (current) use of aspirin: Secondary | ICD-10-CM

## 2018-07-02 DIAGNOSIS — Z79899 Other long term (current) drug therapy: Secondary | ICD-10-CM | POA: Insufficient documentation

## 2018-07-02 DIAGNOSIS — R202 Paresthesia of skin: Secondary | ICD-10-CM | POA: Insufficient documentation

## 2018-07-02 DIAGNOSIS — R200 Anesthesia of skin: Secondary | ICD-10-CM | POA: Insufficient documentation

## 2018-07-02 DIAGNOSIS — I10 Essential (primary) hypertension: Secondary | ICD-10-CM | POA: Insufficient documentation

## 2018-07-02 NOTE — Progress Notes (Signed)
Here for follow up. Per pt " feeling well" except for sinus c/o she stated

## 2018-07-02 NOTE — Progress Notes (Signed)
[place] OFFICE PROGRESS NOTE  [name], [name]   SUMMARY OF ONCOLOGIC HISTORY:  # July 2016-M-protein-IgA Lamda- MGUS: NOV 2016- M spike- 1gm/dl; IgA- 1898 [total]; K/L=N; AUG 2016- Skeletal survey-Normal;  # Stroke- ? [Dr.Shah; 2018]   INTERVAL HISTORY:  77 year old female patient with above history of MGUS is here for follow-up.  Patient says that she had a recent mammogram and noted to have left underarm small lymph nodes.  However felt to be reactive after her recent shingles vaccination.  She has chronic mild tingling and numbness of the right upper extremity.  Chronic joint pains.  Otherwise denies any new onset of bone pain.  Appetite is good.  No weight loss.  No fevers or chills.  Review of Systems  Constitutional: Negative for chills, diaphoresis, fever, malaise/fatigue and weight loss.  HENT: Negative for nosebleeds and sore throat.   Eyes: Negative for double vision.  Respiratory: Negative for cough, hemoptysis, sputum production, shortness of breath and wheezing.   Cardiovascular: Negative for chest pain, palpitations, orthopnea and leg swelling.  Gastrointestinal: Negative for abdominal pain, blood in stool, constipation, diarrhea, heartburn, melena, nausea and vomiting.  Genitourinary: Negative for dysuria, frequency and urgency.  Musculoskeletal: Negative for back pain and joint pain.  Skin: Negative.  Negative for itching and rash.  Neurological: Negative for dizziness, tingling, focal weakness, weakness and headaches.  Endo/Heme/Allergies: Does not bruise/bleed easily.  Psychiatric/Behavioral: Negative for depression. The patient is not nervous/anxious and does not have insomnia.      PAST MEDICAL HISTORY :  Past Medical History:  Diagnosis Date  . Cancer ([place])    basal cell  . Change in bowel habits   . Colon polyps   . Femur fracture, left ([place])   . Hyperlipidemia   .  Hypertension    controlled with medication;   . MGUS (monoclonal gammopathy of unknown significance)   . Osteoporosis    hasn't been checked in a while; unsure how severe;   . Postmenopausal   . Rectal bleeding   . Seasonal allergies   . Stroke [facility]) ??  . Vitamin D deficiency     PAST SURGICAL HISTORY :   Past Surgical History:  Procedure Laterality Date  . BREAST BIOPSY Left 09/30/2008   Patient presented with acute swelling in the breast, aspiration culture negative.  Subsequent core biopsy showing fragments of cyst wall with granulation tissue, fibrosis and focal residual epithelial lining.  No malignancy.  . COLONOSCOPY WITH PROPOFOL N/A 02/05/2015   Procedure: COLONOSCOPY WITH PROPOFOL;  Surgeon: Manya Silvas, MD;  Location[facility] ENDOSCOPY;  Service: Endoscopy;  Laterality: N/A;  . FEMUR FRACTURE SURGERY Left 04/18/2015  . TONSILLECTOMY      FAMILY HISTORY :   Family History  Problem Relation Age of Onset  . Breast cancer Cousin        paternal  . Kidney cancer Cousin        maternal  . Leukemia Cousin        maternal  . Stomach cancer Unknown        uncle  . Prostate cancer Unknown        grandfather  . Skin cancer Unknown        maternal grandparents    SOCIAL HISTORY:   Social History   Tobacco Use  . Smoking status: Never Smoker  . Smokeless tobacco: Never Used  Substance Use Topics  . Alcohol use: No  .  Drug use: No    ALLERGIES:  is allergic to mold extract [trichophyton]; codeine; indomethacin; other; sulfa antibiotics; nickel; and sulfamethoxazole-trimethoprim.  MEDICATIONS:  Current Outpatient Medications  Medication Sig Dispense Refill  . aspirin 81 MG chewable tablet Chew by mouth daily.    Marland Kitchen atorvastatin (LIPITOR) 10 MG tablet Take 10 mg by mouth.     . Calcium Citrate-Vitamin D (CALCIUM CITRATE MAXIMUM/VIT D) 315-250 MG-UNIT TABS Take 1 tablet by mouth 2 (two) times daily.    Marland Kitchen losartan-hydrochlorothiazide (HYZAAR) 50-12.5 MG tablet Take  1 tablet by mouth daily.    . Vitamin D, Ergocalciferol, (DRISDOL) 50000 UNITS CAPS capsule Take 1 capsule by mouth once a week.    Marland Kitchen aspirin 325 MG tablet Take 81 mg by mouth daily.    Marland Kitchen ibuprofen (ADVIL,MOTRIN) 200 MG tablet Take 1 tablet by mouth as needed.    Marland Kitchen L-Lysine 500 MG TABS Take 2 tablets by mouth 4 (four) times daily as needed (mouth sores).      No current facility-administered medications for this visit.     PHYSICAL EXAMINATION:   BP 119/72 (BP Location: Left Arm, Patient Position: Sitting)   Pulse 89   Temp (!) 96.4 F (35.8 C) (Tympanic)   Resp 18   Wt 179 lb 12.8 oz (81.6 kg)   BMI 29.92 kg/m   Filed Weights   07/02/18 1348  Weight: 179 lb 12.8 oz (81.6 kg)    Physical Exam  Constitutional: She is oriented to person, place, and time and well-developed, well-nourished, and in no distress.  HENT:  Head: Normocephalic and atraumatic.  Mouth/Throat: Oropharynx is clear and moist. No oropharyngeal exudate.  Eyes: Pupils are equal, round, and reactive to light.  Neck: Normal range of motion. Neck supple.  Cardiovascular: Normal rate and regular rhythm.  Pulmonary/Chest: No respiratory distress. She has no wheezes.  Abdominal: Soft. Bowel sounds are normal. She exhibits no distension and no mass. There is no tenderness. There is no rebound and no guarding.  Musculoskeletal: Normal range of motion. She exhibits no edema or tenderness.  Neurological: She is alert and oriented to person, place, and time.  Skin: Skin is warm.  Psychiatric: Affect normal.     LABORATORY DATA:  I have reviewed the data as listed    Component Value Date/Time   NA 143 04/28/2015 0555   NA 141 09/29/2011 1424   K 3.5 04/28/2015 0555   K 3.9 09/29/2011 1424   CL 108 04/28/2015 0555   CL 100 09/29/2011 1424   CO2 26 04/28/2015 0555   CO2 25 09/29/2011 1424   GLUCOSE 93 04/28/2015 0555   GLUCOSE 172 (H) 09/29/2011 1424   BUN 10 04/28/2015 0555   BUN 19 (H) 09/29/2011 1424    CREATININE 0.70 09/06/2016 1438   CREATININE 0.79 09/29/2011 1424   CALCIUM 8.2 (L) 04/28/2015 0555   CALCIUM 10.1 09/29/2011 1424   PROT 6.1 (L) 04/28/2015 0555   PROT 8.5 (H) 09/29/2011 1424   ALBUMIN 2.8 (L) 04/28/2015 0555   ALBUMIN 4.1 09/29/2011 1424   AST 24 04/28/2015 0555   AST 24 09/29/2011 1424   ALT 13 (L) 04/28/2015 0555   ALT 12 09/29/2011 1424   ALKPHOS 64 04/28/2015 0555   ALKPHOS 76 09/29/2011 1424   BILITOT 0.9 04/28/2015 0555   BILITOT 0.8 09/29/2011 1424   GFRNONAA >60 04/28/2015 0555   GFRNONAA >60 09/29/2011 1424   GFRAA >60 04/28/2015 0555   GFRAA >60 09/29/2011 1424    No  results found for: SPEP, UPEP  Lab Results  Component Value Date   WBC 6.0 04/28/2015   NEUTROABS 4.1 04/28/2015   HGB 8.6 (L) 04/28/2015   HCT 26.8 (L) 04/28/2015   MCV 91.8 04/28/2015   PLT 255 04/28/2015      Chemistry      Component Value Date/Time   NA 143 04/28/2015 0555   NA 141 09/29/2011 1424   K 3.5 04/28/2015 0555   K 3.9 09/29/2011 1424   CL 108 04/28/2015 0555   CL 100 09/29/2011 1424   CO2 26 04/28/2015 0555   CO2 25 09/29/2011 1424   BUN 10 04/28/2015 0555   BUN 19 (H) 09/29/2011 1424   CREATININE 0.70 09/06/2016 1438   CREATININE 0.79 09/29/2011 1424      Component Value Date/Time   CALCIUM 8.2 (L) 04/28/2015 0555   CALCIUM 10.1 09/29/2011 1424   ALKPHOS 64 04/28/2015 0555   ALKPHOS 76 09/29/2011 1424   AST 24 04/28/2015 0555   AST 24 09/29/2011 1424   ALT 13 (L) 04/28/2015 0555   ALT 12 09/29/2011 1424   BILITOT 0.9 04/28/2015 0555   BILITOT 0.8 09/29/2011 1424        ASSESSMENT & PLAN:   MGUS (monoclonal gammopathy of unknown significance) #IgA lambda MGUS- OCT 2019-1.2 [additional 0.1gm/dal kappa/lamda-N [[facility]]; CBC/CMP-N.  Asymptomatic.  Continue surveillance.  Again had a long discussion with the patient regarding that she has a job about a 20% chance of risk of progression to active multiple myeloma the next 20 years.  And she would  have multiple options available if she needs to be treated.  Discussed options like Revlimid; Velcade if she needed any treatments.  For now continue surveillance.  # Tingling/numbess bil UE- ? Carpal tunnel vs others defer neuorlogy/ [sec to Stroke]  # left axilla LN s/p shingles-likely reactive.  Reviewed the mammogram ultrasound.  Patient awaiting repeat ultrasound this week.  # 25 minutes face-to-face with the patient discussing the above plan of care; more than 50% of time spent on prognosis/ natural history; counseling and coordination.  # DISPOSITION:  # follow-up in 12 months [cbc/cmp myeloma labs done through [place]- 2 weeks prior to the visit.  Cc; Dr.Hedrick-     Cammie Sickle, MD 07/02/2018 2:54 PM

## 2018-07-02 NOTE — Assessment & Plan Note (Addendum)
#  IgA lambda MGUS- OCT 2019-1.2 [additional 0.1gm/dal kappa/lamda-N [[facility]]; CBC/CMP-N.  Asymptomatic.  Continue surveillance.  Again had a long discussion with the patient regarding that she has a job about a 20% chance of risk of progression to active multiple myeloma the next 20 years.  And she would have multiple options available if she needs to be treated.  Discussed options like Revlimid; Velcade if she needed any treatments.  For now continue surveillance.  # Tingling/numbess bil UE- ? Carpal tunnel vs others defer neuorlogy/ [sec to Stroke]  # left axilla LN s/p shingles-likely reactive.  Reviewed the mammogram ultrasound.  Patient awaiting repeat ultrasound this week.  # 25 minutes face-to-face with the patient discussing the above plan of care; more than 50% of time spent on prognosis/ natural history; counseling and coordination.  # DISPOSITION:  # follow-up in 12 months [cbc/cmp myeloma labs done through [place]- 2 weeks prior to the visit.  Cc; Dr.Hedrick-

## 2018-07-05 ENCOUNTER — Ambulatory Visit
Admission: RE | Admit: 2018-07-05 | Discharge: 2018-07-05 | Disposition: A | Discharge status: Home / Self Care | Payer: Medicare HMO | Source: Ambulatory Visit | Attending: General Surgery | Admitting: General Surgery

## 2018-07-05 DIAGNOSIS — N6489 Other specified disorders of breast: Secondary | ICD-10-CM | POA: Diagnosis not present

## 2018-07-05 DIAGNOSIS — R590 Localized enlarged lymph nodes: Secondary | ICD-10-CM | POA: Insufficient documentation

## 2018-07-10 ENCOUNTER — Ambulatory Visit: Payer: Medicare HMO | Admitting: General Surgery

## 2018-07-12 ENCOUNTER — Ambulatory Visit: Payer: Medicare HMO | Admitting: General Surgery

## 2018-07-25 DIAGNOSIS — B001 Herpesviral vesicular dermatitis: Secondary | ICD-10-CM | POA: Diagnosis not present

## 2018-07-25 DIAGNOSIS — I10 Essential (primary) hypertension: Secondary | ICD-10-CM | POA: Diagnosis not present

## 2018-07-25 DIAGNOSIS — E559 Vitamin D deficiency, unspecified: Secondary | ICD-10-CM | POA: Diagnosis not present

## 2018-07-25 DIAGNOSIS — Z0000 Encounter for general adult medical examination without abnormal findings: Secondary | ICD-10-CM | POA: Diagnosis not present

## 2018-07-25 DIAGNOSIS — E785 Hyperlipidemia, unspecified: Secondary | ICD-10-CM | POA: Diagnosis not present

## 2018-08-08 DIAGNOSIS — Z86018 Personal history of other benign neoplasm: Secondary | ICD-10-CM | POA: Diagnosis not present

## 2018-08-08 DIAGNOSIS — L821 Other seborrheic keratosis: Secondary | ICD-10-CM | POA: Diagnosis not present

## 2018-08-08 DIAGNOSIS — Z1283 Encounter for screening for malignant neoplasm of skin: Secondary | ICD-10-CM | POA: Diagnosis not present

## 2018-08-08 DIAGNOSIS — Z85828 Personal history of other malignant neoplasm of skin: Secondary | ICD-10-CM | POA: Diagnosis not present

## 2018-08-08 DIAGNOSIS — L578 Other skin changes due to chronic exposure to nonionizing radiation: Secondary | ICD-10-CM | POA: Diagnosis not present

## 2018-08-08 DIAGNOSIS — Z872 Personal history of diseases of the skin and subcutaneous tissue: Secondary | ICD-10-CM | POA: Diagnosis not present

## 2018-08-10 DIAGNOSIS — I10 Essential (primary) hypertension: Secondary | ICD-10-CM | POA: Diagnosis not present

## 2018-08-15 DIAGNOSIS — R430 Anosmia: Secondary | ICD-10-CM | POA: Diagnosis not present

## 2018-08-15 DIAGNOSIS — Z8673 Personal history of transient ischemic attack (TIA), and cerebral infarction without residual deficits: Secondary | ICD-10-CM | POA: Diagnosis not present

## 2018-08-15 DIAGNOSIS — R2689 Other abnormalities of gait and mobility: Secondary | ICD-10-CM | POA: Diagnosis not present

## 2018-08-23 DIAGNOSIS — T6591XA Toxic effect of unspecified substance, accidental (unintentional), initial encounter: Secondary | ICD-10-CM | POA: Diagnosis not present

## 2018-08-23 DIAGNOSIS — H10212 Acute toxic conjunctivitis, left eye: Secondary | ICD-10-CM | POA: Diagnosis not present

## 2018-09-05 ENCOUNTER — Other Ambulatory Visit: Payer: Self-pay

## 2018-09-05 ENCOUNTER — Encounter: Payer: Self-pay | Admitting: Emergency Medicine

## 2018-09-05 ENCOUNTER — Emergency Department
Admission: EM | Admit: 2018-09-05 | Discharge: 2018-09-05 | Disposition: A | Discharge status: Home / Self Care | Payer: Medicare HMO | Attending: Emergency Medicine | Admitting: Emergency Medicine

## 2018-09-05 ENCOUNTER — Emergency Department: Payer: Medicare HMO

## 2018-09-05 DIAGNOSIS — Z7982 Long term (current) use of aspirin: Secondary | ICD-10-CM | POA: Insufficient documentation

## 2018-09-05 DIAGNOSIS — M159 Polyosteoarthritis, unspecified: Secondary | ICD-10-CM | POA: Diagnosis not present

## 2018-09-05 DIAGNOSIS — Z8673 Personal history of transient ischemic attack (TIA), and cerebral infarction without residual deficits: Secondary | ICD-10-CM | POA: Diagnosis not present

## 2018-09-05 DIAGNOSIS — Z79899 Other long term (current) drug therapy: Secondary | ICD-10-CM | POA: Diagnosis not present

## 2018-09-05 DIAGNOSIS — I10 Essential (primary) hypertension: Secondary | ICD-10-CM | POA: Diagnosis not present

## 2018-09-05 DIAGNOSIS — Z85828 Personal history of other malignant neoplasm of skin: Secondary | ICD-10-CM | POA: Insufficient documentation

## 2018-09-05 DIAGNOSIS — M549 Dorsalgia, unspecified: Secondary | ICD-10-CM | POA: Diagnosis not present

## 2018-09-05 DIAGNOSIS — M791 Myalgia, unspecified site: Secondary | ICD-10-CM | POA: Insufficient documentation

## 2018-09-05 DIAGNOSIS — R079 Chest pain, unspecified: Secondary | ICD-10-CM | POA: Diagnosis not present

## 2018-09-05 DIAGNOSIS — M1389 Other specified arthritis, multiple sites: Secondary | ICD-10-CM | POA: Diagnosis not present

## 2018-09-05 DIAGNOSIS — M7918 Myalgia, other site: Secondary | ICD-10-CM | POA: Diagnosis not present

## 2018-09-05 LAB — CBC WITH DIFFERENTIAL/PLATELET
ABS IMMATURE GRANULOCYTES: 0.03 10*3/uL (ref 0.00–0.07)
Basophils Absolute: 0 10*3/uL (ref 0.0–0.1)
Basophils Relative: 1 %
Eosinophils Absolute: 0.1 10*3/uL (ref 0.0–0.5)
Eosinophils Relative: 1 %
HEMATOCRIT: 34.1 % — AB (ref 36.0–46.0)
HEMOGLOBIN: 10.9 g/dL — AB (ref 12.0–15.0)
Immature Granulocytes: 1 %
Lymphocytes Relative: 23 %
Lymphs Abs: 1.5 10*3/uL (ref 0.7–4.0)
MCH: 28.5 pg (ref 26.0–34.0)
MCHC: 32 g/dL (ref 30.0–36.0)
MCV: 89.3 fL (ref 80.0–100.0)
Monocytes Absolute: 0.6 10*3/uL (ref 0.1–1.0)
Monocytes Relative: 10 %
NEUTROS ABS: 4.2 10*3/uL (ref 1.7–7.7)
NEUTROS PCT: 64 %
Platelets: 242 10*3/uL (ref 150–400)
RBC: 3.82 MIL/uL — ABNORMAL LOW (ref 3.87–5.11)
RDW: 14.3 % (ref 11.5–15.5)
WBC: 6.5 10*3/uL (ref 4.0–10.5)
nRBC: 0 % (ref 0.0–0.2)

## 2018-09-05 LAB — COMPREHENSIVE METABOLIC PANEL
ALK PHOS: 83 U/L (ref 38–126)
ALT: 22 U/L (ref 0–44)
ANION GAP: 8 (ref 5–15)
AST: 30 U/L (ref 15–41)
Albumin: 3.5 g/dL (ref 3.5–5.0)
BILIRUBIN TOTAL: 0.8 mg/dL (ref 0.3–1.2)
BUN: 23 mg/dL (ref 8–23)
CALCIUM: 9 mg/dL (ref 8.9–10.3)
CO2: 25 mmol/L (ref 22–32)
CREATININE: 0.69 mg/dL (ref 0.44–1.00)
Chloride: 107 mmol/L (ref 98–111)
GFR calc Af Amer: >60 mL/min (ref >60)
GFR calc non Af Amer: >60 mL/min (ref >60)
Glucose, Bld: 125 mg/dL — ABNORMAL HIGH (ref 70–99)
Potassium: 4 mmol/L (ref 3.5–5.1)
SODIUM: 140 mmol/L (ref 135–145)
TOTAL PROTEIN: 7.3 g/dL (ref 6.5–8.1)

## 2018-09-05 LAB — URINALYSIS, COMPLETE (UACMP) WITH MICROSCOPIC
Bacteria, UA: NONE SEEN
Bilirubin Urine: NEGATIVE
Glucose, UA: NEGATIVE mg/dL
HGB URINE DIPSTICK: NEGATIVE
Ketones, ur: NEGATIVE mg/dL
Leukocytes, UA: NEGATIVE
Nitrite: NEGATIVE
PH: 6 (ref 5.0–8.0)
Protein, ur: NEGATIVE mg/dL
SPECIFIC GRAVITY, URINE: 1.01 (ref 1.005–1.030)

## 2018-09-05 LAB — TROPONIN I: Troponin I: <0.03 ng/mL (ref <0.03)

## 2018-09-05 LAB — INFLUENZA PANEL BY PCR (TYPE A & B)
INFLBPCR: NEGATIVE
Influenza A By PCR: NEGATIVE

## 2018-09-05 LAB — SEDIMENTATION RATE: Sed Rate: 60 mm/hr — ABNORMAL HIGH (ref 0–30)

## 2018-09-05 MED ORDER — PREDNISONE 10 MG PO TABS: ORAL_TABLET | ORAL | 0 refills | Status: DC

## 2018-09-05 MED ORDER — SODIUM CHLORIDE 0.9 % IV BOLUS
500.0000 mL | Freq: Once | INTRAVENOUS | Status: AC
  Administered 2018-09-05: 500 mL via INTRAVENOUS

## 2018-09-05 MED ORDER — TRAMADOL HCL 50 MG PO TABS: 50.0000 mg | ORAL_TABLET | Freq: Four times a day (QID) | ORAL | 0 refills | Status: DC | PRN

## 2018-09-05 MED ORDER — TRAMADOL HCL 50 MG PO TABS
50.0000 mg | ORAL_TABLET | Freq: Once | ORAL | Status: AC
  Administered 2018-09-05: 50 mg via ORAL
  Filled 2018-09-05: qty 1

## 2018-09-05 NOTE — ED Notes (Signed)
Patient transported to X-ray 

## 2018-09-05 NOTE — ED Provider Notes (Addendum)
EKG: Interpreted by me, sinus rhythm with PACs, possible anterior infarct age-indeterminate, leftward axis, normal QT.     Earleen Newport, MD 09/05/18 2904    Earleen Newport, MD 09/05/18 [phone]

## 2018-09-05 NOTE — ED Provider Notes (Signed)
[facility] Emergency Department Provider Note   ____________________________________________   First MD Initiated Contact with Patient 09/05/18 [phone]     (approximate)  I have reviewed the triage vital signs and the nursing notes.   HISTORY  Chief Complaint Back Pain  HPI Kristin Wise is a 78 y.o. female sent to the ED with multiple vague symptoms for which she cannot describe.  She states she feels like she has "shingles" all over her body.  She states that her body is "on fire and burning".  The symptoms have been going on for the last 3 days.  She denies any fever, chills, nausea, vomiting or diarrhea.  She denies any new medications.  Husband states that there is been no rash.  Patient denies any urinary symptoms.  She rates her pain as an 8 out of 10.   Past Medical History:  Diagnosis Date  . Cancer ([place])    basal cell  . Change in bowel habits   . Colon polyps   . Femur fracture, left ([place])   . Hyperlipidemia   . Hypertension    controlled with medication;   . MGUS (monoclonal gammopathy of unknown significance)   . Osteoporosis    hasn't been checked in a while; unsure how severe;   . Postmenopausal   . Rectal bleeding   . Seasonal allergies   . Stroke [facility]) ??  . Vitamin D deficiency     Patient Active Problem List   Diagnosis Date Noted  . Lymphadenopathy, axillary 04/09/2018  . MGUS (monoclonal gammopathy of unknown significance) 05/25/2016  . HLD (hyperlipidemia) 04/08/2015  . OP (osteoporosis) 04/08/2015  . Allergic rhinitis, seasonal 04/08/2015  . Avitaminosis D 04/08/2015  . Bleeding per rectum 01/12/2015  . Altered bowel function 01/12/2015  . Family history of colonic polyps 01/12/2015    Past Surgical History:  Procedure Laterality Date  . BREAST BIOPSY Left 09/30/2008   Patient presented with acute swelling in the breast, aspiration culture negative.  Subsequent core biopsy showing fragments of cyst wall with  granulation tissue, fibrosis and focal residual epithelial lining.  No malignancy.  . COLONOSCOPY WITH PROPOFOL N/A 02/05/2015   Procedure: COLONOSCOPY WITH PROPOFOL;  Surgeon: Manya Silvas, MD;  Location: [facility] ENDOSCOPY;  Service: Endoscopy;  Laterality: N/A;  . FEMUR FRACTURE SURGERY Left 04/18/2015  . TONSILLECTOMY      Prior to Admission medications   Medication Sig Start Date End Date Taking? Authorizing Provider  aspirin 325 MG tablet Take 81 mg by mouth daily.    [provider]  aspirin 81 MG chewable tablet Chew by mouth daily.    [provider]  atorvastatin (LIPITOR) 10 MG tablet Take 10 mg by mouth.  04/14/17 07/02/18  [provider]  Calcium Citrate-Vitamin D (CALCIUM CITRATE MAXIMUM/VIT D) 315-250 MG-UNIT TABS Take 1 tablet by mouth 2 (two) times daily.    [provider]  ibuprofen (ADVIL,MOTRIN) 200 MG tablet Take 1 tablet by mouth as needed. 03/05/15   [provider]  L-Lysine 500 MG TABS Take 2 tablets by mouth 4 (four) times daily as needed (mouth sores).     [provider]  losartan-hydrochlorothiazide (HYZAAR) 50-12.5 MG tablet Take 1 tablet by mouth daily.    [provider]  predniSONE (DELTASONE) 10 MG tablet Take 1 tablet once a day for 4 days 09/05/18   Johnn Hai, PA-C  traMADol (ULTRAM) 50 MG tablet Take 1 tablet (50 mg total) by mouth  every 6 (six) hours as needed. 09/05/18   Johnn Hai, PA-C  Vitamin D, Ergocalciferol, (DRISDOL) 50000 UNITS CAPS capsule Take 1 capsule by mouth once a week.    [provider]    Allergies Mold extract [trichophyton]; Codeine; Indomethacin; Other; Sulfa antibiotics; Nickel; and Sulfamethoxazole-trimethoprim  Family History  Problem Relation Age of Onset  . Breast cancer Cousin        paternal  . Kidney cancer Cousin        maternal  . Leukemia Cousin        maternal  . Stomach cancer Other        uncle  . Prostate cancer Other         grandfather  . Skin cancer Other        maternal grandparents    Social History Social History   Tobacco Use  . Smoking status: Never Smoker  . Smokeless tobacco: Never Used  Substance Use Topics  . Alcohol use: No  . Drug use: No    Review of Systems Constitutional: No fever/chills Eyes: No visual changes. ENT: No sore throat. Cardiovascular: Denies chest pain. Respiratory: Denies shortness of breath. Gastrointestinal: No abdominal pain.  No nausea, no vomiting.  No diarrhea.  No constipation. Genitourinary: Negative for dysuria. Musculoskeletal: Negative for muscle aches. Skin: Negative for rash. Neurological: Negative for headaches, focal weakness or numbness. ___________________________________________   PHYSICAL EXAM:  VITAL SIGNS: ED Triage Vitals  Enc Vitals Group     BP 09/05/18 0629 (!) 164/71     Pulse Rate 09/05/18 0629 84     Resp 09/05/18 0629 20     Temp 09/05/18 0629 (!) 97.5 F (36.4 C)     Temp Source 09/05/18 0629 Oral     SpO2 09/05/18 0629 99 %     Weight 09/05/18 0628 175 lb (79.4 kg)     Height 09/05/18 0628 5\' 2"  (1.575 m)     Head Circumference --      Peak Flow --      Pain Score 09/05/18 0627 8     Pain Loc --      Pain Edu? --      Excl. in [place]? --    Constitutional: Alert and oriented. Well appearing and in no acute distress.  Patient is tearful and frequently crying out about her pain and burning. Eyes: Conjunctivae are normal. PERRL. EOMI. Head: Atraumatic. Nose: No congestion/rhinnorhea. Mouth/Throat: Mucous membranes are moist.  Oropharynx non-erythematous. Neck: No stridor.  No cervical tenderness on palpation posteriorly. Hematological/Lymphatic/Immunilogical: No cervical lymphadenopathy. Cardiovascular: Normal rate, regular rhythm. Grossly normal heart sounds.  Good peripheral circulation. Respiratory: Normal respiratory effort.  No retractions. Lungs CTAB. Gastrointestinal: Soft and nontender. No distention.No CVA  tenderness. Musculoskeletal: Patient is able to move upper and lower extremities without any difficulty.  On examination of her back there is no gross deformity and no point tenderness on palpation of the thoracic or lumbar spine.  There is some minimal paravertebral muscle tenderness on palpation of the thoracic spine bilaterally.  Range of motion is markedly guarded secondary to pain.  No point tenderness is noted to the lumbar spine.  Patient has good muscle strength bilaterally.  No pitting edema is noted to the lower extremities.  Patient is able to flex and extend without any difficulties.  She was observed ambulating in the ED without any assistance. Neurologic:  Normal speech and language. No gross focal neurologic deficits are appreciated.  Skin:  Skin is warm,  dry and intact. No rash noted. Psychiatric: Mood and affect are normal. Speech and behavior are normal.  ____________________________________________   LABS (all labs ordered are listed, but only abnormal results are displayed)  Labs Reviewed  CBC WITH DIFFERENTIAL/PLATELET - Abnormal; Notable for the following components:      Result Value   RBC 3.82 (*)    Hemoglobin 10.9 (*)    HCT 34.1 (*)    All other components within normal limits  COMPREHENSIVE METABOLIC PANEL - Abnormal; Notable for the following components:   Glucose, Bld 125 (*)    All other components within normal limits  URINALYSIS, COMPLETE (UACMP) WITH MICROSCOPIC - Abnormal; Notable for the following components:   Color, Urine STRAW (*)    APPearance CLEAR (*)    All other components within normal limits  SEDIMENTATION RATE - Abnormal; Notable for the following components:   Sed Rate 60 (*)    All other components within normal limits  TROPONIN I  INFLUENZA PANEL BY PCR (TYPE A & B)   ____________________________________________  EKG  EKG was reviewed by Dr. Jimmye Norman.  EKG normal sinus rhythm with PAC.  Ventricular rate 69 PR interval 146 and QRS  duration 76. ____________________________________________  RADIOLOGY  Official radiology report(s): Dg Chest 2 View  Result Date: 09/05/2018 CLINICAL DATA:  Pain EXAM: CHEST - 2 VIEW COMPARISON:  Chest CT April 27, 2015 FINDINGS: There is no appreciable edema or consolidation. Heart size and pulmonary vascularity are normal. No adenopathy. No bone lesions. No pneumothorax. IMPRESSION: No edema or consolidation. Electronically Signed   By: Lowella Grip III M.D.   On: 09/05/2018 08:07   Dg Thoracic Spine 2 View  Result Date: 09/05/2018 CLINICAL DATA:  Dorsalgia EXAM: THORACIC SPINE 3 VIEWS COMPARISON:  Chest CT with bony reformats April 27, 2015 FINDINGS: Frontal, lateral, and swimmer's views were obtained. No evident fracture or spondylolisthesis. There is mild to moderate disc space narrowing at multiple levels. There are multiple anterior and right-sided osteophytes. No erosive change or paraspinous lesion. IMPRESSION: No evident acute fracture or spondylolisthesis. Osteoarthritic change at multiple levels. Electronically Signed   By: Lowella Grip III M.D.   On: 09/05/2018 08:08    ____________________________________________   PROCEDURES  Procedure(s) performed: None  Procedures  Critical Care performed: No  ____________________________________________   INITIAL IMPRESSION / ASSESSMENT AND PLAN / ED COURSE  As part of my medical decision making, I reviewed the following data within the electronic MEDICAL RECORD NUMBER Notes from prior ED visits and  Controlled Substance Database  Patient presents to the ED with multiple vague complaints and crying due to pain.  Patient initially stated that she felt like she had shingles "all over her body".  There was no rash noted.  There is no history of injury and patient has not taken any over-the-counter medications.  Physical exam was vague as well with patient being a poor historian and also poor communication.  EKG, chest x-ray,  troponin was done to evaluate cardiac status.  Sedimentation rate was elevated at 60.  Metabolic panel and CBC was unremarkable.  Urinalysis was negative for UTI.  Patient was given a short bolus of normal saline at 500 mL's and tramadol 50 mg p.o.  Patient began to improve.  It was only then that she disclosed that she got a new computer for Christmas and that the monitor is much lower than her older computer.  She has been trying to adjust the monitor and after hours of working on  her computer she has noticed cervical tightness.  She is reassured that most likely this was the initial cause of her muscle skeletal pain.  Patient is to begin with prednisone 10 mg daily for the next 4 days as she is very anxious about taking prednisone.  She was also given a prescription for tramadol to continue taking as needed for pain.  She is encouraged to use ice or heat to her neck as needed and to have her monitor for her computer elevated to prevent this from happening again.  She is to follow-up with her PCP if any continued problems or return to the emergency department if any severe worsening of her symptoms.  ____________________________________________   FINAL CLINICAL IMPRESSION(S) / ED DIAGNOSES  Final diagnoses:  Myalgia  Osteoarthritis of multiple joints, unspecified osteoarthritis type     ED Discharge Orders         Ordered    predniSONE (DELTASONE) 10 MG tablet     09/05/18 0928    traMADol (ULTRAM) 50 MG tablet  Every 6 hours PRN     09/05/18 1914           Note:  This document was prepared using Dragon voice recognition software and may include unintentional dictation errors.    Johnn Hai, PA-C 09/05/18 1549    Earleen Newport, MD 09/06/18 [phone]

## 2018-09-05 NOTE — ED Triage Notes (Signed)
Patient ambulatory to triage with steady gait, without difficulty or distress noted; pt c/o "burning" to entire back x 3 days with no accomp symptoms, no radiating pain; denies any hx of same, denies any injury; st "it feels like shingles" but denies any rash and st has been vaccinated for such

## 2018-09-05 NOTE — Discharge Instructions (Signed)
Call make an appointment with your primary care provider for next week for reevaluation.  Begin taking prednisone 1 tablet each day until finished.  Tramadol is 1 every 6 hours as needed for pain.  Be aware that this medication could cause drowsiness and increase your risk for falling.  Your first dose was in the emergency department.  Increase fluids.  Turn to the emergency department if any severe worsening of your symptoms are urgent concerns.

## 2018-09-24 DIAGNOSIS — H9190 Unspecified hearing loss, unspecified ear: Secondary | ICD-10-CM | POA: Diagnosis not present

## 2018-09-24 DIAGNOSIS — H6123 Impacted cerumen, bilateral: Secondary | ICD-10-CM | POA: Diagnosis not present

## 2018-10-03 DIAGNOSIS — G4733 Obstructive sleep apnea (adult) (pediatric): Secondary | ICD-10-CM | POA: Diagnosis not present

## 2018-11-29 DIAGNOSIS — G4733 Obstructive sleep apnea (adult) (pediatric): Secondary | ICD-10-CM | POA: Diagnosis not present

## 2018-11-29 DIAGNOSIS — Z9989 Dependence on other enabling machines and devices: Secondary | ICD-10-CM | POA: Diagnosis not present

## 2019-01-23 DIAGNOSIS — B001 Herpesviral vesicular dermatitis: Secondary | ICD-10-CM | POA: Diagnosis not present

## 2019-01-23 DIAGNOSIS — G4733 Obstructive sleep apnea (adult) (pediatric): Secondary | ICD-10-CM | POA: Diagnosis not present

## 2019-01-29 DIAGNOSIS — G4733 Obstructive sleep apnea (adult) (pediatric): Secondary | ICD-10-CM | POA: Diagnosis not present

## 2019-02-13 DIAGNOSIS — G4733 Obstructive sleep apnea (adult) (pediatric): Secondary | ICD-10-CM | POA: Diagnosis not present

## 2019-02-14 DIAGNOSIS — H2513 Age-related nuclear cataract, bilateral: Secondary | ICD-10-CM | POA: Diagnosis not present

## 2019-02-28 DIAGNOSIS — G4733 Obstructive sleep apnea (adult) (pediatric): Secondary | ICD-10-CM | POA: Diagnosis not present

## 2019-03-31 DIAGNOSIS — G4733 Obstructive sleep apnea (adult) (pediatric): Secondary | ICD-10-CM | POA: Diagnosis not present

## 2019-04-15 ENCOUNTER — Telehealth: Payer: Self-pay

## 2019-04-15 NOTE — Telephone Encounter (Signed)
RN returned call to pt. Answered pt questions about Td immunizations.

## 2019-05-01 DIAGNOSIS — G4733 Obstructive sleep apnea (adult) (pediatric): Secondary | ICD-10-CM | POA: Diagnosis not present

## 2019-05-31 DIAGNOSIS — G4733 Obstructive sleep apnea (adult) (pediatric): Secondary | ICD-10-CM | POA: Diagnosis not present

## 2019-06-17 ENCOUNTER — Telehealth: Payer: Self-pay | Admitting: *Deleted

## 2019-06-17 NOTE — Telephone Encounter (Signed)
Patient called reporting that she went to [facility] to get her labs drawn and they cannot do one of the tests ordered because there is no test code on the order form for it. He requests that you please get the test code to put on the form so that she can get her labs drawn.Pleae return her call [phone]

## 2019-06-17 NOTE — Telephone Encounter (Signed)
Left vm for patient to return my phone call. Test number are [phone] and [phone]

## 2019-06-17 NOTE — Telephone Encounter (Signed)
Spoke with patient - provided with test numbers

## 2019-06-20 DIAGNOSIS — D472 Monoclonal gammopathy: Secondary | ICD-10-CM | POA: Diagnosis not present

## 2019-06-21 ENCOUNTER — Encounter: Payer: Self-pay | Admitting: Internal Medicine

## 2019-06-24 ENCOUNTER — Encounter: Payer: Self-pay | Admitting: Internal Medicine

## 2019-07-01 DIAGNOSIS — G4733 Obstructive sleep apnea (adult) (pediatric): Secondary | ICD-10-CM | POA: Diagnosis not present

## 2019-07-03 ENCOUNTER — Inpatient Hospital Stay: Payer: Medicare HMO | Attending: Internal Medicine | Admitting: Internal Medicine

## 2019-07-03 DIAGNOSIS — D472 Monoclonal gammopathy: Secondary | ICD-10-CM | POA: Diagnosis not present

## 2019-07-03 NOTE — Assessment & Plan Note (Addendum)
#   IgA lambda MGUS- OCT 2020- 1.1 [additional 0.1gm/dal kappa/lamda-N [[facility]]; CBC/CMP-N.  Asymptomatic.  Clinically stable.  Continue monitoring for now.  No role for any treatment.  # BCC on Right LE-s/p resection.  # DISPOSITION:  # follow-up in 12 months; MD; labs- [cbc/cmp myeloma; k/l labs done through [place]- 2 weeks prior to the visit.-Dr.B  Cc; Dr.Hedrick-

## 2019-07-03 NOTE — Progress Notes (Signed)
I connected with Kristin Wise on 07/03/19 at  2:45 PM EST by telephone visit and verified that I am speaking with the correct person using two identifiers.  I discussed the limitations, risks, security and privacy concerns of performing an evaluation and management service by telemedicine and the availability of in-person appointments. I also discussed with the patient that there may be a patient responsible charge related to this service. The patient expressed understanding and agreed to proceed.    Other persons participating in the visit and their role in the encounter: RN/medical reconciliation Patient's location: Home Provider's location: Home  Oncology History   No history exists.     Chief Complaint: MGUS   History of present illness:Kristin Wise 78 y.o.  female with history of MGUS currently on surveillance is here for follow-up.  Patient denies any new onset of back pain or joint pains.  No nausea no vomiting but appetite is good but no weight loss but no fatigue.  Chronic mild tingling and numbness in extremities.  Recently patient had a basal cell carcinoma of the right lower extremity taken out.   Observation/objective:  Assessment and plan: MGUS (monoclonal gammopathy of unknown significance) # IgA lambda MGUS- OCT 2020- 1.1 [additional 0.1gm/dal kappa/lamda-N [[facility]]; CBC/CMP-N.  Asymptomatic.  Clinically stable.  Continue monitoring for now.  No role for any treatment.  # BCC on Right LE-s/p resection.  # DISPOSITION:  # follow-up in 12 months; MD; labs- [cbc/cmp myeloma; k/l labs done through [place]- 2 weeks prior to the visit.-Dr.B  Cc; Dr.Hedrick-     Follow-up instructions:  I discussed the assessment and treatment plan with the patient.  The patient was provided an opportunity to ask questions and all were answered.  The patient agreed with the plan and demonstrated understanding of instructions.  The patient was advised to call back or  seek an in person evaluation if the symptoms worsen or if the condition fails to improve as anticipated.  I provided 12 minutes of non face-to-face telephone visit time during this encounter, and > 50% was spent counseling as documented under my assessment & plan.  Dr. Charlaine Dalton [facility] 07/03/2019 4:14 PM

## 2019-07-31 DIAGNOSIS — G4733 Obstructive sleep apnea (adult) (pediatric): Secondary | ICD-10-CM | POA: Diagnosis not present

## 2019-08-05 DIAGNOSIS — G4733 Obstructive sleep apnea (adult) (pediatric): Secondary | ICD-10-CM | POA: Diagnosis not present

## 2019-08-07 ENCOUNTER — Other Ambulatory Visit: Payer: Self-pay | Admitting: Family Medicine

## 2019-08-07 DIAGNOSIS — Z1231 Encounter for screening mammogram for malignant neoplasm of breast: Secondary | ICD-10-CM

## 2019-08-08 DIAGNOSIS — I10 Essential (primary) hypertension: Secondary | ICD-10-CM | POA: Diagnosis not present

## 2019-08-08 DIAGNOSIS — E785 Hyperlipidemia, unspecified: Secondary | ICD-10-CM | POA: Diagnosis not present

## 2019-08-09 ENCOUNTER — Ambulatory Visit
Admission: RE | Admit: 2019-08-09 | Discharge: 2019-08-09 | Disposition: A | Discharge status: Home / Self Care | Payer: Medicare HMO | Source: Ambulatory Visit | Attending: Family Medicine | Admitting: Family Medicine

## 2019-08-09 DIAGNOSIS — Z1231 Encounter for screening mammogram for malignant neoplasm of breast: Secondary | ICD-10-CM | POA: Diagnosis not present

## 2019-08-14 DIAGNOSIS — R2689 Other abnormalities of gait and mobility: Secondary | ICD-10-CM | POA: Diagnosis not present

## 2019-08-14 DIAGNOSIS — D649 Anemia, unspecified: Secondary | ICD-10-CM | POA: Diagnosis not present

## 2019-08-14 DIAGNOSIS — R42 Dizziness and giddiness: Secondary | ICD-10-CM | POA: Diagnosis not present

## 2019-08-14 DIAGNOSIS — I10 Essential (primary) hypertension: Secondary | ICD-10-CM | POA: Diagnosis not present

## 2019-08-14 DIAGNOSIS — Z0000 Encounter for general adult medical examination without abnormal findings: Secondary | ICD-10-CM | POA: Diagnosis not present

## 2019-08-14 DIAGNOSIS — E785 Hyperlipidemia, unspecified: Secondary | ICD-10-CM | POA: Diagnosis not present

## 2019-08-16 DIAGNOSIS — G4733 Obstructive sleep apnea (adult) (pediatric): Secondary | ICD-10-CM | POA: Diagnosis not present

## 2019-08-16 DIAGNOSIS — R430 Anosmia: Secondary | ICD-10-CM | POA: Diagnosis not present

## 2019-08-16 DIAGNOSIS — Z8673 Personal history of transient ischemic attack (TIA), and cerebral infarction without residual deficits: Secondary | ICD-10-CM | POA: Diagnosis not present

## 2019-08-16 DIAGNOSIS — R2689 Other abnormalities of gait and mobility: Secondary | ICD-10-CM | POA: Diagnosis not present

## 2019-08-31 DIAGNOSIS — G4733 Obstructive sleep apnea (adult) (pediatric): Secondary | ICD-10-CM | POA: Diagnosis not present

## 2019-10-01 DIAGNOSIS — G4733 Obstructive sleep apnea (adult) (pediatric): Secondary | ICD-10-CM | POA: Diagnosis not present

## 2019-10-09 DIAGNOSIS — E785 Hyperlipidemia, unspecified: Secondary | ICD-10-CM | POA: Diagnosis not present

## 2019-10-29 DIAGNOSIS — G4733 Obstructive sleep apnea (adult) (pediatric): Secondary | ICD-10-CM | POA: Diagnosis not present

## 2019-11-29 DIAGNOSIS — G4733 Obstructive sleep apnea (adult) (pediatric): Secondary | ICD-10-CM | POA: Diagnosis not present

## 2019-12-06 DIAGNOSIS — G4733 Obstructive sleep apnea (adult) (pediatric): Secondary | ICD-10-CM | POA: Diagnosis not present

## 2019-12-29 DIAGNOSIS — G4733 Obstructive sleep apnea (adult) (pediatric): Secondary | ICD-10-CM | POA: Diagnosis not present

## 2019-12-31 DIAGNOSIS — Z86010 Personal history of colonic polyps: Secondary | ICD-10-CM | POA: Diagnosis not present

## 2019-12-31 DIAGNOSIS — Z87898 Personal history of other specified conditions: Secondary | ICD-10-CM | POA: Diagnosis not present

## 2020-01-10 ENCOUNTER — Ambulatory Visit (LOCAL_COMMUNITY_HEALTH_CENTER): Payer: Medicare HMO

## 2020-01-10 ENCOUNTER — Other Ambulatory Visit: Payer: Self-pay

## 2020-01-10 DIAGNOSIS — Z719 Counseling, unspecified: Secondary | ICD-10-CM

## 2020-01-10 NOTE — Progress Notes (Signed)
Presents to [facility] for routine 10 year tetanus containing vaccine. Per [facility], last Tdap received 02/28/2011. Counseled next routine tetanus vaccine due 02/27/2021. Copy of record provided for client and their PCP. Rich Number, RN

## 2020-01-29 DIAGNOSIS — G4733 Obstructive sleep apnea (adult) (pediatric): Secondary | ICD-10-CM | POA: Diagnosis not present

## 2020-02-12 DIAGNOSIS — E785 Hyperlipidemia, unspecified: Secondary | ICD-10-CM | POA: Diagnosis not present

## 2020-02-12 DIAGNOSIS — I10 Essential (primary) hypertension: Secondary | ICD-10-CM | POA: Diagnosis not present

## 2020-02-12 DIAGNOSIS — G4733 Obstructive sleep apnea (adult) (pediatric): Secondary | ICD-10-CM | POA: Diagnosis not present

## 2020-02-12 DIAGNOSIS — Z79899 Other long term (current) drug therapy: Secondary | ICD-10-CM | POA: Diagnosis not present

## 2020-02-12 DIAGNOSIS — Z9989 Dependence on other enabling machines and devices: Secondary | ICD-10-CM | POA: Diagnosis not present

## 2020-02-17 DIAGNOSIS — H2513 Age-related nuclear cataract, bilateral: Secondary | ICD-10-CM | POA: Diagnosis not present

## 2020-04-06 ENCOUNTER — Other Ambulatory Visit: Payer: Self-pay

## 2020-04-06 ENCOUNTER — Other Ambulatory Visit
Admission: RE | Admit: 2020-04-06 | Discharge: 2020-04-06 | Disposition: A | Discharge status: Home / Self Care | Payer: Medicare HMO | Source: Ambulatory Visit | Attending: Internal Medicine | Admitting: Internal Medicine

## 2020-04-06 DIAGNOSIS — Z01812 Encounter for preprocedural laboratory examination: Secondary | ICD-10-CM | POA: Insufficient documentation

## 2020-04-06 DIAGNOSIS — Z20822 Contact with and (suspected) exposure to covid-19: Secondary | ICD-10-CM | POA: Diagnosis not present

## 2020-04-07 ENCOUNTER — Encounter: Payer: Self-pay | Admitting: Internal Medicine

## 2020-04-07 LAB — SARS CORONAVIRUS 2 (TAT 6-24 HRS): SARS Coronavirus 2: NEGATIVE

## 2020-04-08 ENCOUNTER — Ambulatory Visit: Payer: Medicare HMO | Admitting: Certified Registered"

## 2020-04-08 ENCOUNTER — Encounter
Admission: RE | Disposition: A | Discharge status: Home / Self Care | Payer: Self-pay | Source: Home / Self Care | Attending: Internal Medicine

## 2020-04-08 ENCOUNTER — Encounter: Payer: Self-pay | Admitting: Internal Medicine

## 2020-04-08 ENCOUNTER — Ambulatory Visit
Admission: RE | Admit: 2020-04-08 | Discharge: 2020-04-08 | Disposition: A | Discharge status: Home / Self Care | Payer: Medicare HMO | Attending: Internal Medicine | Admitting: Internal Medicine

## 2020-04-08 DIAGNOSIS — K648 Other hemorrhoids: Secondary | ICD-10-CM | POA: Diagnosis not present

## 2020-04-08 DIAGNOSIS — Z7982 Long term (current) use of aspirin: Secondary | ICD-10-CM | POA: Insufficient documentation

## 2020-04-08 DIAGNOSIS — I10 Essential (primary) hypertension: Secondary | ICD-10-CM | POA: Insufficient documentation

## 2020-04-08 DIAGNOSIS — K5730 Diverticulosis of large intestine without perforation or abscess without bleeding: Secondary | ICD-10-CM | POA: Insufficient documentation

## 2020-04-08 DIAGNOSIS — Z885 Allergy status to narcotic agent status: Secondary | ICD-10-CM | POA: Diagnosis not present

## 2020-04-08 DIAGNOSIS — Z86010 Personal history of colonic polyps: Secondary | ICD-10-CM | POA: Diagnosis not present

## 2020-04-08 DIAGNOSIS — G4730 Sleep apnea, unspecified: Secondary | ICD-10-CM | POA: Diagnosis not present

## 2020-04-08 DIAGNOSIS — Z79899 Other long term (current) drug therapy: Secondary | ICD-10-CM | POA: Insufficient documentation

## 2020-04-08 DIAGNOSIS — Z882 Allergy status to sulfonamides status: Secondary | ICD-10-CM | POA: Diagnosis not present

## 2020-04-08 DIAGNOSIS — E559 Vitamin D deficiency, unspecified: Secondary | ICD-10-CM | POA: Diagnosis not present

## 2020-04-08 DIAGNOSIS — Z8673 Personal history of transient ischemic attack (TIA), and cerebral infarction without residual deficits: Secondary | ICD-10-CM | POA: Diagnosis not present

## 2020-04-08 DIAGNOSIS — Z1211 Encounter for screening for malignant neoplasm of colon: Secondary | ICD-10-CM | POA: Insufficient documentation

## 2020-04-08 DIAGNOSIS — K5790 Diverticulosis of intestine, part unspecified, without perforation or abscess without bleeding: Secondary | ICD-10-CM | POA: Diagnosis not present

## 2020-04-08 DIAGNOSIS — Z881 Allergy status to other antibiotic agents status: Secondary | ICD-10-CM | POA: Insufficient documentation

## 2020-04-08 DIAGNOSIS — E785 Hyperlipidemia, unspecified: Secondary | ICD-10-CM | POA: Diagnosis not present

## 2020-04-08 DIAGNOSIS — K641 Second degree hemorrhoids: Secondary | ICD-10-CM | POA: Insufficient documentation

## 2020-04-08 HISTORY — DX: Sleep apnea, unspecified: G47.30

## 2020-04-08 HISTORY — PX: COLONOSCOPY WITH PROPOFOL: SHX5780

## 2020-04-08 SURGERY — COLONOSCOPY WITH PROPOFOL
Anesthesia: General

## 2020-04-08 MED ORDER — LIDOCAINE HCL (PF) 1 % IJ SOLN
INTRAMUSCULAR | Status: AC
  Filled 2020-04-08: qty 2

## 2020-04-08 MED ORDER — LIDOCAINE HCL (CARDIAC) PF 100 MG/5ML IV SOSY
PREFILLED_SYRINGE | INTRAVENOUS | Status: DC | PRN
  Administered 2020-04-08: 100 mg via INTRAVENOUS

## 2020-04-08 MED ORDER — PROPOFOL 500 MG/50ML IV EMUL
INTRAVENOUS | Status: DC | PRN
  Administered 2020-04-08: 155 ug/kg/min via INTRAVENOUS

## 2020-04-08 MED ORDER — SODIUM CHLORIDE 0.9 % IV SOLN
INTRAVENOUS | Status: DC
  Administered 2020-04-08: 1000 mL via INTRAVENOUS

## 2020-04-08 MED ORDER — PROPOFOL 10 MG/ML IV BOLUS
INTRAVENOUS | Status: DC | PRN
  Administered 2020-04-08: 10 mg via INTRAVENOUS
  Administered 2020-04-08: 50 mg via INTRAVENOUS

## 2020-04-08 MED ORDER — GLYCOPYRROLATE 0.2 MG/ML IJ SOLN
INTRAMUSCULAR | Status: DC | PRN
  Administered 2020-04-08: .2 mg via INTRAVENOUS

## 2020-04-08 MED ORDER — PHENYLEPHRINE HCL (PRESSORS) 10 MG/ML IV SOLN
INTRAVENOUS | Status: DC | PRN
  Administered 2020-04-08: 200 ug via INTRAVENOUS
  Administered 2020-04-08: 100 ug via INTRAVENOUS

## 2020-04-08 NOTE — Anesthesia Procedure Notes (Signed)
Procedure Name: General with mask airway °Performed by: Fletcher-Harrison, Dorien Mayotte, CRNA °Pre-anesthesia Checklist: Patient identified, Emergency Drugs available, Patient being monitored and Suction available °Patient Re-evaluated:Patient Re-evaluated prior to induction °Oxygen Delivery Method: Simple face mask °Induction Type: IV induction °Placement Confirmation: positive ETCO2 and CO2 detector °Dental Injury: Teeth and Oropharynx as per pre-operative assessment  ° ° ° ° ° ° °

## 2020-04-08 NOTE — Transfer of Care (Signed)
Immediate Anesthesia Transfer of Care Note  Patient: Cletus Mehlhoff Aristizabal  Procedure(s) Performed: COLONOSCOPY WITH PROPOFOL (N/A )  Patient Location: Endoscopy Unit  Anesthesia Type:General  Level of Consciousness: drowsy and responds to stimulation  Airway & Oxygen Therapy: Patient Spontanous Breathing and Patient connected to face mask oxygen  Post-op Assessment: Report given to RN and Post -op Vital signs reviewed and stable  Post vital signs: Reviewed and stable  Last Vitals:  Vitals Value Taken Time  BP 72/32 04/08/20 1255  Temp    Pulse 66 04/08/20 1256  Resp 16 04/08/20 1256  SpO2 100 % 04/08/20 1256  Vitals shown include unvalidated device data.  Last Pain:  Vitals:   04/08/20 1256  TempSrc: (P) Temporal  PainSc:          Complications: No complications documented.

## 2020-04-08 NOTE — Interval H&P Note (Signed)
History and Physical Interval Note:  04/08/2020 12:29 PM  Kristin Wise  has presented today for surgery, with the diagnosis of P HX TA POLYPS.  The various methods of treatment have been discussed with the patient and family. After consideration of risks, benefits and other options for treatment, the patient has consented to  Procedure(s): COLONOSCOPY WITH PROPOFOL (N/A) as a surgical intervention.  The patient's history has been reviewed, patient examined, no change in status, stable for surgery.  I have reviewed the patient's chart and labs.  Questions were answered to the patient's satisfaction.     [place], [place]

## 2020-04-08 NOTE — Anesthesia Preprocedure Evaluation (Signed)
Anesthesia Evaluation  Patient identified by MRN, date of birth, ID band Patient awake    Reviewed: Allergy & Precautions, NPO status , Patient's Chart, lab work & pertinent test results  History of Anesthesia Complications Negative for: history of anesthetic complications  Airway Mallampati: II  TM Distance: >3 FB Neck ROM: Full    Dental  (+) Implants   Pulmonary sleep apnea (does not tolerate wearing CPAP) , neg COPD,    breath sounds clear to auscultation- rhonchi (-) wheezing      Cardiovascular hypertension, Pt. on medications (-) CAD, (-) Past MI, (-) Cardiac Stents and (-) CABG  Rhythm:Regular Rate:Normal - Systolic murmurs and - Diastolic murmurs    Neuro/Psych CVA (balance problems), Residual Symptoms    GI/Hepatic negative GI ROS, Neg liver ROS,   Endo/Other  negative endocrine ROSneg diabetes  Renal/GU negative Renal ROS     Musculoskeletal negative musculoskeletal ROS (+)   Abdominal (+) + obese,   Peds  Hematology negative hematology ROS (+)   Anesthesia Other Findings   Reproductive/Obstetrics                             Anesthesia Physical Anesthesia Plan  ASA: III  Anesthesia Plan: General   Post-op Pain Management:    Induction: Intravenous  PONV Risk Score and Plan: 2 and Propofol infusion  Airway Management Planned: Natural Airway  Additional Equipment:   Intra-op Plan:   Post-operative Plan:   Informed Consent: I have reviewed the patients History and Physical, chart, labs and discussed the procedure including the risks, benefits and alternatives for the proposed anesthesia with the patient or authorized representative who has indicated his/her understanding and acceptance.     Dental advisory given  Plan Discussed with: CRNA and Anesthesiologist  Anesthesia Plan Comments:         Anesthesia Quick Evaluation

## 2020-04-08 NOTE — Anesthesia Postprocedure Evaluation (Signed)
Anesthesia Post Note  Patient: Kristin Wise  Procedure(s) Performed: COLONOSCOPY WITH PROPOFOL (N/A )  Patient location during evaluation: Endoscopy Anesthesia Type: General Level of consciousness: awake and alert Pain management: pain level controlled Vital Signs Assessment: post-procedure vital signs reviewed and stable Respiratory status: spontaneous breathing, nonlabored ventilation, respiratory function stable and patient connected to nasal cannula oxygen Cardiovascular status: blood pressure returned to baseline and stable Postop Assessment: no apparent nausea or vomiting Anesthetic complications: no   No complications documented.   Last Vitals:  Vitals:   04/08/20 1256 04/08/20 1316  BP:  103/68  Pulse:    Resp:    Temp: 36.8 C   SpO2:      Last Pain:  Vitals:   04/08/20 1327  TempSrc:   PainSc: 0-No pain                 Kristin Wise

## 2020-04-08 NOTE — Op Note (Signed)
[facility] Gastroenterology Patient Name: Kristin Wise Procedure Date: 04/08/2020 12:24 PM MRN: [id number] Account #: [id number] Date of Birth: 30-Jan-1941 Admit Type: Outpatient Age: 79 Room: [facility] ENDO ROOM 3 Gender: Female Note Status: Finalized Procedure:             Colonoscopy Indications:           Surveillance: Personal history of adenomatous polyps                         on last colonoscopy > 5 years ago Providers:             Lorie Apley K. [name] MD, MD Referring MD:          Irven Easterly. Kary Kos, MD (Referring MD) Medicines:             Propofol per Anesthesia Complications:         No immediate complications. Procedure:             Pre-Anesthesia Assessment:                        - The risks and benefits of the procedure and the                         sedation options and risks were discussed with the                         patient. All questions were answered and informed                         consent was obtained.                        - Patient identification and proposed procedure were                         verified prior to the procedure by the nurse. The                         procedure was verified in the procedure room.                        - ASA Grade Assessment: III - A patient with severe                         systemic disease.                        - After reviewing the risks and benefits, the patient                         was deemed in satisfactory condition to undergo the                         procedure.                        After obtaining informed consent, the colonoscope was                         passed under direct  vision. Throughout the procedure,                         the patient's blood pressure, pulse, and oxygen                         saturations were monitored continuously. The                         Colonoscope was introduced through the anus and                         advanced to the the terminal  ileum, with                         identification of the appendiceal orifice and IC                         valve. The colonoscopy was performed without                         difficulty. The patient tolerated the procedure well.                         The quality of the bowel preparation was good. The                         terminal ileum, ileocecal valve, appendiceal orifice,                         and rectum were photographed. Findings:      The perianal and digital rectal examinations were normal. Pertinent       negatives include normal sphincter tone and no palpable rectal lesions.      Many small-mouthed diverticula were found in the entire colon. There was       no evidence of diverticular bleeding.      Internal hemorrhoids were found during retroflexion. The hemorrhoids       were Grade II (internal hemorrhoids that prolapse but reduce       spontaneously).      The exam was otherwise without abnormality.      The terminal ileum appeared normal.      Normal mucosa was found in the entire colon. Impression:            - Mild diverticulosis in the entire examined colon.                         There was no evidence of diverticular bleeding.                        - Internal hemorrhoids.                        - The examination was otherwise normal.                        - No specimens collected. Recommendation:        - Patient has a contact number available for  emergencies. The signs and symptoms of potential                         delayed complications were discussed with the patient.                         Return to normal activities tomorrow. Written                         discharge instructions were provided to the patient.                        - Resume previous diet.                        - Continue present medications.                        - No repeat colonoscopy due to current age (3 years                         or older) and the  absence of colonic polyps.                        - You do NOT require further colon cancer screening                         measures (Annual stool testing (i.e. hemoccult, FIT,                         cologuard), sigmoidoscopy, colonoscopy or CT                         colonography). You should share this recommendation                         with your Primary Care provider.                        - Return to GI office PRN.                        - The findings and recommendations were discussed with                         the patient. Procedure Code(s):     --- Professional ---                        Z6109, Colorectal cancer screening; colonoscopy on                         individual at high risk Diagnosis Code(s):     --- Professional ---                        K57.30, Diverticulosis of large intestine without                         perforation or abscess without bleeding  K64.1, Second degree hemorrhoids                        Z86.010, Personal history of colonic polyps CPT copyright 2019 American Medical Association. All rights reserved. The codes documented in this report are preliminary and upon coder review may  be revised to meet current compliance requirements. Efrain Sella MD, MD 04/08/2020 12:56:05 PM This report has been signed electronically. Number of Addenda: 0 Note Initiated On: 04/08/2020 12:24 PM Scope Withdrawal Time: 0 hours 5 minutes 32 seconds  Total Procedure Duration: 0 hours 10 minutes 56 seconds  Estimated Blood Loss:  Estimated blood loss: none. Estimated blood loss: none.      [facility]

## 2020-04-08 NOTE — H&P (Signed)
Outpatient short stay form Pre-procedure 04/08/2020 12:29 PM Kristin Wise Kristin Wise, M.D.  Primary Physician: Kristin Wise, M.D.  Reason for visit:  Personal history of adenomatous colon polyps (2016)  History of present illness:                            Patient presents for colonoscopy for a personal hx of colon polyps. The patient denies abdominal pain, abnormal weight loss or rectal bleeding.      Current Facility-Administered Medications:  .  0.9 %  sodium chloride infusion, , Intravenous, Continuous, Buena, Benay Pike, MD, Last Rate: 20 mL/hr at 04/08/20 1151, 1,000 mL at 04/08/20 1151 .  lidocaine (PF) (XYLOCAINE) 1 % injection, , , ,   Medications Prior to Admission  Medication Sig Dispense Refill Last Dose  . aspirin 81 MG chewable tablet Chew by mouth daily.   Past Week at Unknown time  . Calcium Citrate-Vitamin D (CALCIUM CITRATE MAXIMUM/VIT D) 315-250 MG-UNIT TABS Take 1 tablet by mouth 2 (two) times daily.   Past Week at Unknown time  . ibuprofen (ADVIL,MOTRIN) 200 MG tablet Take 1 tablet by mouth as needed.   Past Week at Unknown time  . L-Lysine 500 MG TABS Take 2 tablets by mouth 4 (four) times daily as needed (mouth sores).    Past Week at Unknown time  . losartan-hydrochlorothiazide (HYZAAR) 50-12.5 MG tablet Take 0.5 tablets by mouth daily.    04/08/2020 at 0600  . Vitamin D, Ergocalciferol, (DRISDOL) 50000 UNITS CAPS capsule Take 1 capsule by mouth once a week.   Past Week at Unknown time  . aspirin 325 MG tablet Take 81 mg by mouth daily. (Patient not taking: Reported on 04/08/2020)   Not Taking at Unknown time  . atorvastatin (LIPITOR) 10 MG tablet Take 10 mg by mouth.      . traMADol (ULTRAM) 50 MG tablet Take 1 tablet (50 mg total) by mouth every 6 (six) hours as needed. (Patient not taking: Reported on 07/02/2019) 15 tablet 0      Allergies  Allergen Reactions  . Mold Extract [Trichophyton] Other (See Comments)    Allergy to it.   . Codeine     "Feels bad"  .  Indomethacin     "Bad dreams"  . Other     Sodium Laurel Sulfate- caused gums to peel, corners of mouth became sore  . Sulfa Antibiotics Hives    Feels terrible   . Nickel Rash  . Sulfamethoxazole-Trimethoprim Rash     Past Medical History:  Diagnosis Date  . Cancer ([place])    basal cell  . Change in bowel habits   . Colon polyps   . Femur fracture, left ([place])   . Hyperlipidemia   . Hypertension    controlled with medication;   . MGUS (monoclonal gammopathy of unknown significance)   . Osteoporosis    hasn't been checked in a while; unsure how severe;   . Postmenopausal   . Rectal bleeding   . Seasonal allergies   . Sleep apnea   . Stroke [facility]) ??  . Vitamin D deficiency     Review of systems:  Otherwise negative.    Physical Exam  Gen: Alert, oriented. Appears stated age.  HEENT: [place]/AT. PERRLA. Lungs: CTA, no wheezes. CV: RR nl S1, S2. Abd: soft, benign, no masses. BS+ Ext: No edema. Pulses 2+    Planned procedures: Proceed with colonoscopy. The patient understands the nature of  the planned procedure, indications, risks, alternatives and potential complications including but not limited to bleeding, infection, perforation, damage to internal organs and possible oversedation/side effects from anesthesia. The patient agrees and gives consent to proceed.  Please refer to procedure notes for findings, recommendations and patient disposition/instructions.     Kristin Wise Kristin Wise, M.D. Gastroenterology 04/08/2020  12:29 PM

## 2020-04-09 ENCOUNTER — Encounter: Payer: Self-pay | Admitting: Internal Medicine

## 2020-04-16 ENCOUNTER — Telehealth: Payer: Self-pay | Admitting: *Deleted

## 2020-04-16 NOTE — Telephone Encounter (Signed)
Spoke with Dr. Rogue Bussing - patient may proceed with covid-19 vaccine booster.

## 2020-04-16 NOTE — Telephone Encounter (Signed)
Message received from scheduling. Pt "wants to know if her MGUS affects her immune system and is it safe to do the booster shot"

## 2020-04-27 ENCOUNTER — Ambulatory Visit: Payer: Self-pay | Attending: Internal Medicine

## 2020-04-27 DIAGNOSIS — Z23 Encounter for immunization: Secondary | ICD-10-CM

## 2020-04-27 NOTE — Progress Notes (Signed)
   Covid-19 Vaccination Clinic  Name:  Kristin Wise    MRN: [id number] DOB: 1941/05/31  04/27/2020  [name] was observed post Covid-19 immunization for 15 minutes without incident. She was provided with Vaccine Information Sheet and instruction to access the V-Safe system.   [name] was instructed to call 911 with any severe reactions post vaccine: Marland Kitchen Difficulty breathing  . Swelling of face and throat  . A fast heartbeat  . A bad rash all over body  . Dizziness and weakness

## 2020-06-18 DIAGNOSIS — D472 Monoclonal gammopathy: Secondary | ICD-10-CM | POA: Diagnosis not present

## 2020-06-19 ENCOUNTER — Encounter: Payer: Self-pay | Admitting: Internal Medicine

## 2020-06-23 ENCOUNTER — Encounter: Payer: Self-pay | Admitting: Internal Medicine

## 2020-07-02 ENCOUNTER — Other Ambulatory Visit: Payer: Self-pay | Admitting: Family Medicine

## 2020-07-02 ENCOUNTER — Other Ambulatory Visit: Payer: Self-pay

## 2020-07-02 DIAGNOSIS — Z1231 Encounter for screening mammogram for malignant neoplasm of breast: Secondary | ICD-10-CM

## 2020-07-03 ENCOUNTER — Other Ambulatory Visit: Payer: Self-pay

## 2020-07-03 ENCOUNTER — Inpatient Hospital Stay: Payer: Medicare HMO | Attending: Internal Medicine | Admitting: Internal Medicine

## 2020-07-03 ENCOUNTER — Encounter: Payer: Self-pay | Admitting: Internal Medicine

## 2020-07-03 DIAGNOSIS — D472 Monoclonal gammopathy: Secondary | ICD-10-CM | POA: Diagnosis not present

## 2020-07-03 DIAGNOSIS — Z803 Family history of malignant neoplasm of breast: Secondary | ICD-10-CM | POA: Insufficient documentation

## 2020-07-03 NOTE — Progress Notes (Signed)
[facility] OFFICE PROGRESS NOTE  [name], [name]   SUMMARY OF ONCOLOGIC HISTORY:  # July 2016-M-protein-IgA Lamda- MGUS: NOV 2016- M spike- 1gm/dl; IgA- 1898 [total]; K/L=N; AUG 2016- Skeletal survey-Normal;  # Stroke- ? [Dr.Shah; 2018]   INTERVAL HISTORY:  79 year old female patient with above history of MGUS is here for follow-up.  Patient denies any unusual tingling and numbness in extremities.  Has any fevers or chills but no nausea vomiting pain no lumps or bumps.  Chronic joint pains not any worse.   Review of Systems  Constitutional: Negative for chills, diaphoresis, fever, malaise/fatigue and weight loss.  HENT: Negative for nosebleeds and sore throat.   Eyes: Negative for double vision.  Respiratory: Negative for cough, hemoptysis, sputum production, shortness of breath and wheezing.   Cardiovascular: Negative for chest pain, palpitations, orthopnea and leg swelling.  Gastrointestinal: Negative for abdominal pain, blood in stool, constipation, diarrhea, heartburn, melena, nausea and vomiting.  Genitourinary: Negative for dysuria, frequency and urgency.  Musculoskeletal: Positive for back pain and joint pain.  Skin: Negative.  Negative for itching and rash.  Neurological: Negative for dizziness, tingling, focal weakness, weakness and headaches.  Endo/Heme/Allergies: Does not bruise/bleed easily.  Psychiatric/Behavioral: Negative for depression. The patient is not nervous/anxious and does not have insomnia.      PAST MEDICAL HISTORY :  Past Medical History:  Diagnosis Date  . Cancer ([place])    basal cell  . Change in bowel habits   . Colon polyps   . Femur fracture, left ([place])   . Hyperlipidemia   . Hypertension    controlled with medication;   . MGUS (monoclonal gammopathy of unknown significance)   . Osteoporosis    hasn't been checked in a while; unsure how severe;   . Postmenopausal    . Rectal bleeding   . Seasonal allergies   . Sleep apnea   . Stroke [facility]) ??  . Vitamin D deficiency     PAST SURGICAL HISTORY :   Past Surgical History:  Procedure Laterality Date  . BREAST BIOPSY Left 09/30/2008   Patient presented with acute swelling in the breast, aspiration culture negative.  Subsequent core biopsy showing fragments of cyst wall with granulation tissue, fibrosis and focal residual epithelial lining.  No malignancy.  . broken arm (right)    . broken collar bone sugery    . COLONOSCOPY WITH PROPOFOL N/A 02/05/2015   Procedure: COLONOSCOPY WITH PROPOFOL;  Surgeon: Manya Silvas, MD;  Location: [facility] ENDOSCOPY;  Service: Endoscopy;  Laterality: N/A;  . COLONOSCOPY WITH PROPOFOL N/A 04/08/2020   Procedure: COLONOSCOPY WITH PROPOFOL;  Surgeon: Toledo, Benay Pike, MD;  Location: [facility] ENDOSCOPY;  Service: Gastroenterology;  Laterality: N/A;  . FEMUR FRACTURE SURGERY Left 04/18/2015  . FRACTURE SURGERY    . TONSILLECTOMY      FAMILY HISTORY :   Family History  Problem Relation Age of Onset  . Breast cancer Cousin        paternal  . Kidney cancer Cousin        maternal  . Leukemia Cousin        maternal  . Stomach cancer Other        uncle  . Prostate cancer Other        grandfather  . Skin cancer Other        maternal grandparents    SOCIAL HISTORY:   Social History   Tobacco Use  .  Smoking status: Never Smoker  . Smokeless tobacco: Never Used  Vaping Use  . Vaping Use: Never used  Substance Use Topics  . Alcohol use: No  . Drug use: No    ALLERGIES:  is allergic to mold extract [trichophyton], codeine, indomethacin, other, sulfa antibiotics, nickel, and sulfamethoxazole-trimethoprim.  MEDICATIONS:  Current Outpatient Medications  Medication Sig Dispense Refill  . aspirin 81 MG chewable tablet Chew by mouth daily.    . Calcium Citrate-Vitamin D (CALCIUM CITRATE MAXIMUM/VIT D) 315-250 MG-UNIT TABS Take 1 tablet by mouth 2 (two) times daily.    Marland Kitchen  L-Lysine 500 MG TABS Take 2 tablets by mouth 4 (four) times daily as needed (mouth sores).     . losartan-hydrochlorothiazide (HYZAAR) 50-12.5 MG tablet Take 0.5 tablets by mouth daily.     . rosuvastatin (CRESTOR) 10 MG tablet Take by mouth.    . Vitamin D, Ergocalciferol, (DRISDOL) 50000 UNITS CAPS capsule Take 1 capsule by mouth once a week.    . Alpha-Lipoic Acid 600 MG TABS Take by mouth.    . Biotin 5 MG TBDP Take by mouth.    Marland Kitchen ibuprofen (ADVIL,MOTRIN) 200 MG tablet Take 1 tablet by mouth as needed. (Patient not taking: Reported on 07/03/2020)    . Lutein 10 MG TABS Take by mouth.     No current facility-administered medications for this visit.    PHYSICAL EXAMINATION:   BP (!) 149/66 (BP Location: Left Arm, Patient Position: Sitting, Cuff Size: Normal)   Pulse 73   Temp 97.9 F (36.6 C) (Tympanic)   Resp 16   Ht 5\' 2"  (1.575 m)   Wt 165 lb (74.8 kg)   SpO2 98%   BMI 30.18 kg/m   Filed Weights   07/03/20 1420  Weight: 165 lb (74.8 kg)    Physical Exam HENT:     Head: Normocephalic and atraumatic.     Mouth/Throat:     Pharynx: No oropharyngeal exudate.  Eyes:     Pupils: Pupils are equal, round, and reactive to light.  Cardiovascular:     Rate and Rhythm: Normal rate and regular rhythm.  Pulmonary:     Effort: No respiratory distress.     Breath sounds: No wheezing.  Abdominal:     General: Bowel sounds are normal. There is no distension.     Palpations: Abdomen is soft. There is no mass.     Tenderness: There is no abdominal tenderness. There is no guarding or rebound.  Musculoskeletal:        General: No tenderness. Normal range of motion.     Cervical back: Normal range of motion and neck supple.  Skin:    General: Skin is warm.  Neurological:     Mental Status: She is alert and oriented to person, place, and time.  Psychiatric:        Mood and Affect: Affect normal.      LABORATORY DATA:  I have reviewed the data as listed    Component Value  Date/Time   NA 140 09/05/2018 0730   NA 141 09/29/2011 1424   K 4.0 09/05/2018 0730   K 3.9 09/29/2011 1424   CL 107 09/05/2018 0730   CL 100 09/29/2011 1424   CO2 25 09/05/2018 0730   CO2 25 09/29/2011 1424   GLUCOSE 125 (H) 09/05/2018 0730   GLUCOSE 172 (H) 09/29/2011 1424   BUN 23 09/05/2018 0730   BUN 19 (H) 09/29/2011 1424   CREATININE 0.69 09/05/2018 0730  CREATININE 0.79 09/29/2011 1424   CALCIUM 9.0 09/05/2018 0730   CALCIUM 10.1 09/29/2011 1424   PROT 7.3 09/05/2018 0730   PROT 8.5 (H) 09/29/2011 1424   ALBUMIN 3.5 09/05/2018 0730   ALBUMIN 4.1 09/29/2011 1424   AST 30 09/05/2018 0730   AST 24 09/29/2011 1424   ALT 22 09/05/2018 0730   ALT 12 09/29/2011 1424   ALKPHOS 83 09/05/2018 0730   ALKPHOS 76 09/29/2011 1424   BILITOT 0.8 09/05/2018 0730   BILITOT 0.8 09/29/2011 1424   GFRNONAA >60 09/05/2018 0730   GFRNONAA >60 09/29/2011 1424   GFRAA >60 09/05/2018 0730   GFRAA >60 09/29/2011 1424    No results found for: SPEP, UPEP  Lab Results  Component Value Date   WBC 6.5 09/05/2018   NEUTROABS 4.2 09/05/2018   HGB 10.9 (L) 09/05/2018   HCT 34.1 (L) 09/05/2018   MCV 89.3 09/05/2018   PLT 242 09/05/2018      Chemistry      Component Value Date/Time   NA 140 09/05/2018 0730   NA 141 09/29/2011 1424   K 4.0 09/05/2018 0730   K 3.9 09/29/2011 1424   CL 107 09/05/2018 0730   CL 100 09/29/2011 1424   CO2 25 09/05/2018 0730   CO2 25 09/29/2011 1424   BUN 23 09/05/2018 0730   BUN 19 (H) 09/29/2011 1424   CREATININE 0.69 09/05/2018 0730   CREATININE 0.79 09/29/2011 1424      Component Value Date/Time   CALCIUM 9.0 09/05/2018 0730   CALCIUM 10.1 09/29/2011 1424   ALKPHOS 83 09/05/2018 0730   ALKPHOS 76 09/29/2011 1424   AST 30 09/05/2018 0730   AST 24 09/29/2011 1424   ALT 22 09/05/2018 0730   ALT 12 09/29/2011 1424   BILITOT 0.8 09/05/2018 0730   BILITOT 0.8 09/29/2011 1424        ASSESSMENT & PLAN:   MGUS (monoclonal gammopathy of  unknown significance) # IgA lambda MGUS- OCT 2020- 1.3 [additional 0.1gm/dal kappa/lamda-N [[facility]]; CBC/CMP-N.  STABLE.   # Relative hypogammaglobinemia no symptoms; discussed at length that this is part of the myeloma spectrum.  Would not recommend any IVIG infusions unless patient is having any repeated infectious episodes.  # DISPOSITION:  # follow-up in 12 months; MD; labs- [cbc/cmp myeloma; k/l labs done through [place]- 2 weeks prior to the visit.-Dr.B  Cc; Dr.Hedrick-     Cammie Sickle, MD 07/06/2020 3:30 PM

## 2020-07-03 NOTE — Assessment & Plan Note (Addendum)
#   IgA lambda MGUS- OCT 2020- 1.3 [additional 0.1gm/dal kappa/lamda-N [[facility]]; CBC/CMP-N.  STABLE.   # Relative hypogammaglobinemia no symptoms; discussed at length that this is part of the myeloma spectrum.  Would not recommend any IVIG infusions unless patient is having any repeated infectious episodes.  # DISPOSITION:  # follow-up in 12 months; MD; labs- [cbc/cmp myeloma; k/l labs done through [place]- 2 weeks prior to the visit.-Dr.B  Cc; Dr.Hedrick-

## 2020-07-21 DIAGNOSIS — M810 Age-related osteoporosis without current pathological fracture: Secondary | ICD-10-CM | POA: Diagnosis not present

## 2020-08-10 ENCOUNTER — Ambulatory Visit
Admission: RE | Admit: 2020-08-10 | Discharge: 2020-08-10 | Disposition: A | Discharge status: Home / Self Care | Payer: Medicare HMO | Source: Ambulatory Visit | Attending: Family Medicine | Admitting: Family Medicine

## 2020-08-10 ENCOUNTER — Other Ambulatory Visit: Payer: Self-pay

## 2020-08-10 DIAGNOSIS — Z1231 Encounter for screening mammogram for malignant neoplasm of breast: Secondary | ICD-10-CM | POA: Diagnosis not present

## 2020-08-11 DIAGNOSIS — E785 Hyperlipidemia, unspecified: Secondary | ICD-10-CM | POA: Diagnosis not present

## 2020-08-11 DIAGNOSIS — I10 Essential (primary) hypertension: Secondary | ICD-10-CM | POA: Diagnosis not present

## 2020-08-14 DIAGNOSIS — R430 Anosmia: Secondary | ICD-10-CM | POA: Diagnosis not present

## 2020-08-14 DIAGNOSIS — R2689 Other abnormalities of gait and mobility: Secondary | ICD-10-CM | POA: Diagnosis not present

## 2020-08-14 DIAGNOSIS — Z8673 Personal history of transient ischemic attack (TIA), and cerebral infarction without residual deficits: Secondary | ICD-10-CM | POA: Diagnosis not present

## 2020-08-14 DIAGNOSIS — G4733 Obstructive sleep apnea (adult) (pediatric): Secondary | ICD-10-CM | POA: Diagnosis not present

## 2020-08-17 DIAGNOSIS — E559 Vitamin D deficiency, unspecified: Secondary | ICD-10-CM | POA: Diagnosis not present

## 2020-08-17 DIAGNOSIS — G4733 Obstructive sleep apnea (adult) (pediatric): Secondary | ICD-10-CM | POA: Diagnosis not present

## 2020-08-17 DIAGNOSIS — Z0000 Encounter for general adult medical examination without abnormal findings: Secondary | ICD-10-CM | POA: Diagnosis not present

## 2020-08-17 DIAGNOSIS — R739 Hyperglycemia, unspecified: Secondary | ICD-10-CM | POA: Diagnosis not present

## 2020-08-17 DIAGNOSIS — I10 Essential (primary) hypertension: Secondary | ICD-10-CM | POA: Diagnosis not present

## 2020-08-17 DIAGNOSIS — E785 Hyperlipidemia, unspecified: Secondary | ICD-10-CM | POA: Diagnosis not present

## 2020-11-27 ENCOUNTER — Other Ambulatory Visit (HOSPITAL_COMMUNITY): Payer: Self-pay | Admitting: Internal Medicine

## 2020-11-27 ENCOUNTER — Ambulatory Visit: Payer: Medicare HMO | Attending: Internal Medicine

## 2020-11-27 DIAGNOSIS — Z23 Encounter for immunization: Secondary | ICD-10-CM

## 2020-11-27 NOTE — Progress Notes (Signed)
   Covid-19 Vaccination Clinic  Name:  KASSADI PRESSWOOD    MRN: [id number] DOB: [birth year]  11/27/2020  Ms. Camberos was observed post Covid-19 immunization for 15 minutes without incident. She was provided with Vaccine Information Sheet and instruction to access the V-Safe system.   Ms. Cielo was instructed to call 911 with any severe reactions post vaccine: Marland Kitchen Difficulty breathing  . Swelling of face and throat  . A fast heartbeat  . A bad rash all over body  . Dizziness and weakness   Immunizations Administered    Name Date Dose VIS Date Route   PFIZER Comrnaty(Gray TOP) Covid-19 Vaccine 11/27/2020  1:52 PM 0.3 mL 08/06/2020 Intramuscular   Manufacturer: [employer], [employer]   Lot: [id number]   [place]: [phone]

## 2021-01-01 DIAGNOSIS — Z0100 Encounter for examination of eyes and vision without abnormal findings: Secondary | ICD-10-CM | POA: Diagnosis not present

## 2021-01-01 DIAGNOSIS — H2512 Age-related nuclear cataract, left eye: Secondary | ICD-10-CM | POA: Diagnosis not present

## 2021-01-01 DIAGNOSIS — H353131 Nonexudative age-related macular degeneration, bilateral, early dry stage: Secondary | ICD-10-CM | POA: Diagnosis not present

## 2021-02-17 DIAGNOSIS — H2512 Age-related nuclear cataract, left eye: Secondary | ICD-10-CM | POA: Diagnosis not present

## 2021-03-02 ENCOUNTER — Ambulatory Visit
Admission: RE | Admit: 2021-03-02 | Discharge: 2021-03-02 | Disposition: A | Discharge status: Home / Self Care | Payer: Medicare HMO | Attending: Ophthalmology | Admitting: Ophthalmology

## 2021-03-02 ENCOUNTER — Encounter: Payer: Self-pay | Admitting: Ophthalmology

## 2021-03-02 ENCOUNTER — Other Ambulatory Visit: Payer: Self-pay

## 2021-03-02 ENCOUNTER — Ambulatory Visit: Payer: Medicare HMO | Admitting: Anesthesiology

## 2021-03-02 ENCOUNTER — Encounter
Admission: RE | Disposition: A | Discharge status: Home / Self Care | Payer: Self-pay | Source: Home / Self Care | Attending: Ophthalmology

## 2021-03-02 DIAGNOSIS — I10 Essential (primary) hypertension: Secondary | ICD-10-CM | POA: Insufficient documentation

## 2021-03-02 DIAGNOSIS — Z8051 Family history of malignant neoplasm of kidney: Secondary | ICD-10-CM | POA: Insufficient documentation

## 2021-03-02 DIAGNOSIS — Z885 Allergy status to narcotic agent status: Secondary | ICD-10-CM | POA: Diagnosis not present

## 2021-03-02 DIAGNOSIS — Z808 Family history of malignant neoplasm of other organs or systems: Secondary | ICD-10-CM | POA: Diagnosis not present

## 2021-03-02 DIAGNOSIS — H25812 Combined forms of age-related cataract, left eye: Secondary | ICD-10-CM | POA: Diagnosis not present

## 2021-03-02 DIAGNOSIS — Z79899 Other long term (current) drug therapy: Secondary | ICD-10-CM | POA: Diagnosis not present

## 2021-03-02 DIAGNOSIS — Z800 Family history of malignant neoplasm of digestive organs: Secondary | ICD-10-CM | POA: Diagnosis not present

## 2021-03-02 DIAGNOSIS — Z803 Family history of malignant neoplasm of breast: Secondary | ICD-10-CM | POA: Diagnosis not present

## 2021-03-02 DIAGNOSIS — Z888 Allergy status to other drugs, medicaments and biological substances status: Secondary | ICD-10-CM | POA: Insufficient documentation

## 2021-03-02 DIAGNOSIS — Z806 Family history of leukemia: Secondary | ICD-10-CM | POA: Diagnosis not present

## 2021-03-02 DIAGNOSIS — H2512 Age-related nuclear cataract, left eye: Secondary | ICD-10-CM | POA: Insufficient documentation

## 2021-03-02 DIAGNOSIS — Z8042 Family history of malignant neoplasm of prostate: Secondary | ICD-10-CM | POA: Insufficient documentation

## 2021-03-02 DIAGNOSIS — Z882 Allergy status to sulfonamides status: Secondary | ICD-10-CM | POA: Insufficient documentation

## 2021-03-02 HISTORY — PX: CATARACT EXTRACTION W/PHACO: SHX586

## 2021-03-02 SURGERY — PHACOEMULSIFICATION, CATARACT, WITH IOL INSERTION
Anesthesia: Monitor Anesthesia Care | Site: Eye | Laterality: Left

## 2021-03-02 MED ORDER — SIGHTPATH DOSE#1 BSS IO SOLN
INTRAOCULAR | Status: DC | PRN
  Administered 2021-03-02: 46 mL via OPHTHALMIC

## 2021-03-02 MED ORDER — SIGHTPATH DOSE#1 NA CHONDROIT SULF-NA HYALURON 40-17 MG/ML IO SOLN
INTRAOCULAR | Status: DC | PRN
  Administered 2021-03-02: 1 mL via INTRAOCULAR

## 2021-03-02 MED ORDER — CYCLOPENTOLATE HCL 2 % OP SOLN
1.0000 [drp] | OPHTHALMIC | Status: DC | PRN
  Administered 2021-03-02 (×3): 1 [drp] via OPHTHALMIC

## 2021-03-02 MED ORDER — MIDAZOLAM HCL 2 MG/2ML IJ SOLN
INTRAMUSCULAR | Status: DC | PRN
  Administered 2021-03-02: 1 mg via INTRAVENOUS

## 2021-03-02 MED ORDER — TETRACAINE HCL 0.5 % OP SOLN
1.0000 [drp] | OPHTHALMIC | Status: DC | PRN
  Administered 2021-03-02 (×3): 1 [drp] via OPHTHALMIC

## 2021-03-02 MED ORDER — LACTATED RINGERS IV SOLN: INTRAVENOUS | Status: DC

## 2021-03-02 MED ORDER — BRIMONIDINE TARTRATE-TIMOLOL 0.2-0.5 % OP SOLN
OPHTHALMIC | Status: DC | PRN
  Administered 2021-03-02: 1 [drp] via OPHTHALMIC

## 2021-03-02 MED ORDER — MOXIFLOXACIN HCL 0.5 % OP SOLN
OPHTHALMIC | Status: DC | PRN
  Administered 2021-03-02: 0.2 mL via OPHTHALMIC

## 2021-03-02 MED ORDER — PHENYLEPHRINE HCL 10 % OP SOLN
1.0000 [drp] | OPHTHALMIC | Status: DC | PRN
  Administered 2021-03-02 (×3): 1 [drp] via OPHTHALMIC

## 2021-03-02 MED ORDER — LIDOCAINE HCL (PF) 2 % IJ SOLN
INTRAOCULAR | Status: DC | PRN
  Administered 2021-03-02: 1 mL

## 2021-03-02 MED ORDER — FENTANYL CITRATE (PF) 100 MCG/2ML IJ SOLN
INTRAMUSCULAR | Status: DC | PRN
  Administered 2021-03-02: 50 ug via INTRAVENOUS

## 2021-03-02 SURGICAL SUPPLY — 17 items
CANNULA ANT/CHMB 27G (MISCELLANEOUS) ×2 IMPLANT
CANNULA ANT/CHMB 27GA (MISCELLANEOUS) ×4 IMPLANT
GLOVE SURG ENC TEXT LTX SZ8 (GLOVE) ×2 IMPLANT
GLOVE SURG TRIUMPH 8.0 PF LTX (GLOVE) ×2 IMPLANT
GOWN STRL REUS W/ TWL LRG LVL3 (GOWN DISPOSABLE) ×2 IMPLANT
GOWN STRL REUS W/TWL LRG LVL3 (GOWN DISPOSABLE) ×4
LENS IOL TECNIS EYHANCE 20.5 (Intraocular Lens) ×1 IMPLANT
MARKER SKIN DUAL TIP RULER LAB (MISCELLANEOUS) ×2 IMPLANT
NDL FILTER BLUNT 18X1 1/2 (NEEDLE) ×1 IMPLANT
NEEDLE FILTER BLUNT 18X 1/2SAF (NEEDLE) ×1
NEEDLE FILTER BLUNT 18X1 1/2 (NEEDLE) ×1 IMPLANT
PACK EYE AFTER SURG (MISCELLANEOUS) ×2 IMPLANT
SYR 3ML LL SCALE MARK (SYRINGE) ×2 IMPLANT
SYR TB 1ML LUER SLIP (SYRINGE) ×2 IMPLANT
TIP ITREPID SGL USE BENT I/A (SUCTIONS) ×1 IMPLANT
WATER STERILE IRR 250ML POUR (IV SOLUTION) ×2 IMPLANT
WIPE NON LINTING 3.25X3.25 (MISCELLANEOUS) ×2 IMPLANT

## 2021-03-02 NOTE — Transfer of Care (Signed)
Immediate Anesthesia Transfer of Care Note  Patient: Kristin Wise  Procedure(s) Performed: CATARACT EXTRACTION PHACO AND INTRAOCULAR LENS PLACEMENT (IOC) LEFT 6.40 00:35.2 (Left: Eye)  Patient Location: PACU  Anesthesia Type: MAC  Level of Consciousness: awake, alert  and patient cooperative  Airway and Oxygen Therapy: Patient Spontanous Breathing and Patient connected to supplemental oxygen  Post-op Assessment: Post-op Vital signs reviewed, Patient's Cardiovascular Status Stable, Respiratory Function Stable, Patent Airway and No signs of Nausea or vomiting  Post-op Vital Signs: Reviewed and stable  Complications: No notable events documented.

## 2021-03-02 NOTE — Op Note (Signed)
PREOPERATIVE DIAGNOSIS:  Nuclear sclerotic cataract of the left eye.   POSTOPERATIVE DIAGNOSIS:  Nuclear sclerotic cataract of the left eye.   OPERATIVE PROCEDURE:ORPROCALL@   SURGEON:  Birder Robson, MD.   ANESTHESIA:  Anesthesiologist: [name], MD CRNA: Cameron Ali, CRNA  1.      Managed anesthesia care. 2.     0.58ml of Shugarcaine was instilled following the paracentesis   COMPLICATIONS:  None.   TECHNIQUE:   Stop and chop   DESCRIPTION OF PROCEDURE:  The patient was examined and consented in the preoperative holding area where the aforementioned topical anesthesia was applied to the left eye and then brought back to the Operating Room where the left eye was prepped and draped in the usual sterile ophthalmic fashion and a lid speculum was placed. A paracentesis was created with the side port blade and the anterior chamber was filled with viscoelastic. A near clear corneal incision was performed with the steel keratome. A continuous curvilinear capsulorrhexis was performed with a cystotome followed by the capsulorrhexis forceps. Hydrodissection and hydrodelineation were carried out with BSS on a blunt cannula. The lens was removed in a stop and chop  technique and the remaining cortical material was removed with the irrigation-aspiration handpiece. The capsular bag was inflated with viscoelastic and the Technis [id number] lens was placed in the capsular bag without complication. The remaining viscoelastic was removed from the eye with the irrigation-aspiration handpiece. The wounds were hydrated. The anterior chamber was flushed with BSS and the eye was inflated to physiologic pressure. 0.38ml Vigamox was placed in the anterior chamber. The wounds were found to be water tight. The eye was dressed with Combigan. The patient was given protective glasses to wear throughout the day and a shield with which to sleep tonight. The patient was also given drops with which to begin a drop regimen  today and will follow-up with me in one day. Implant Name Type Inv. Item Serial No. Manufacturer Lot No. LRB No. Used Action  LENS IOL TECNIS EYHANCE 20.5 - [id number] Intraocular Lens LENS IOL TECNIS EYHANCE 20.5 [id number] JOHNSON   Left 1 Implanted    Procedure(s): CATARACT EXTRACTION PHACO AND INTRAOCULAR LENS PLACEMENT (IOC) LEFT 6.40 00:35.2 (Left)  Electronically signed: Birder Robson 03/02/2021 8:37 AM

## 2021-03-02 NOTE — Anesthesia Procedure Notes (Signed)
Procedure Name: MAC Date/Time: 03/02/2021 8:31 AM Performed by: Cameron Ali, CRNA Pre-anesthesia Checklist: Patient identified, Emergency Drugs available, Suction available, Timeout performed and Patient being monitored Patient Re-evaluated:Patient Re-evaluated prior to induction Oxygen Delivery Method: Nasal cannula Placement Confirmation: positive ETCO2

## 2021-03-02 NOTE — H&P (Signed)
[facility]   Primary Care Physician:  Maryland Pink, MD Ophthalmologist: Dr. George Ina  Pre-Procedure History & Physical: HPI:  Kristin Wise is a 80 y.o. female here for cataract surgery.   Past Medical History:  Diagnosis Date   Cancer ([place])    basal cell   Change in bowel habits    Colon polyps    Femur fracture, left (HCC)    Hyperlipidemia    Hypertension    controlled with medication;    MGUS (monoclonal gammopathy of unknown significance)    Osteoporosis    hasn't been checked in a while; unsure how severe;    Postmenopausal    Rectal bleeding    Seasonal allergies    Sleep apnea    Stroke [facility]) ??   Vitamin D deficiency     Past Surgical History:  Procedure Laterality Date   BREAST BIOPSY Left 09/30/2008   Patient presented with acute swelling in the breast, aspiration culture negative.  Subsequent core biopsy showing fragments of cyst wall with granulation tissue, fibrosis and focal residual epithelial lining.  No malignancy.   broken arm (right)     broken collar bone sugery     COLONOSCOPY WITH PROPOFOL N/A 02/05/2015   Procedure: COLONOSCOPY WITH PROPOFOL;  Surgeon: Manya Silvas, MD;  Location[facility] ENDOSCOPY;  Service: Endoscopy;  Laterality: N/A;   COLONOSCOPY WITH PROPOFOL N/A 04/08/2020   Procedure: COLONOSCOPY WITH PROPOFOL;  Surgeon: Toledo, Benay Pike, MD;  Location: [facility] ENDOSCOPY;  Service: Gastroenterology;  Laterality: N/A;   FEMUR FRACTURE SURGERY Left 04/18/2015   FRACTURE SURGERY     TONSILLECTOMY      Prior to Admission medications   Medication Sig Start Date End Date Taking? Authorizing Provider  Alpha-Lipoic Acid 600 MG TABS Take by mouth.   Yes [provider]  aspirin 81 MG chewable tablet Chew by mouth daily.   Yes [provider]  Biotin 5 MG TBDP Take by mouth.   Yes [provider]  Calcium Citrate-Vitamin D 315-250 MG-UNIT TABS Take 1 tablet by mouth 2 (two) times daily.   Yes [provider]  ibuprofen (ADVIL,MOTRIN) 200 MG tablet Take 1 tablet by mouth as needed. 03/05/15  Yes [provider]  L-Lysine 500 MG TABS Take 2 tablets by mouth 4 (four) times daily as needed (mouth sores).    Yes [provider]  losartan-hydrochlorothiazide (HYZAAR) 50-12.5 MG tablet Take 0.5 tablets by mouth daily.    Yes [provider]  Lutein 10 MG TABS Take by mouth.   Yes [provider]  rosuvastatin (CRESTOR) 10 MG tablet Take by mouth.   Yes [provider]  Vitamin D, Ergocalciferol, (DRISDOL) 50000 UNITS CAPS capsule Take 1 capsule by mouth once a week.   Yes [provider]  COVID-19 mRNA Vac-TriS, Pfizer, SUSP injection USE AS DIRECTED 11/27/20 11/27/21  Carlyle Basques, MD    Allergies as of 01/07/2021 - Review Complete 07/03/2020  Allergen Reaction Noted   Mold extract [trichophyton] Other (See Comments) 02/03/2015   Codeine  01/15/2015   Indomethacin  01/15/2015   Other  01/15/2015   Sulfa antibiotics Hives 12/31/2014   Nickel Rash 11/16/2015   Sulfamethoxazole-trimethoprim Rash 11/16/2015    Family History  Problem Relation Age of Onset   Breast cancer Cousin        paternal   Kidney cancer Cousin        maternal   Leukemia Cousin  maternal   Stomach cancer Other        uncle   Prostate cancer Other        grandfather   Skin cancer Other        maternal grandparents    Social History   Socioeconomic History   Marital status: Married    Spouse name: Not on file   Number of children: Not on file   Years of education: Not on file   Highest education level: Not on file  Occupational History   Not on file  Tobacco Use   Smoking status: Never   Smokeless tobacco: Never  Vaping Use   Vaping Use: Never used  Substance and Sexual Activity   Alcohol use: No   Drug use: No   Sexual activity: Not on file  Other Topics Concern   Not on file  Social History Narrative   Not on file   Social  Determinants of Health   Financial Resource Strain: Not on file  Food Insecurity: Not on file  Transportation Needs: Not on file  Physical Activity: Not on file  Stress: Not on file  Social Connections: Not on file  Intimate Partner Violence: Not on file    Review of Systems: See HPI, otherwise negative ROS  Physical Exam: BP (!) 154/78   Pulse 65   Temp (!) 97.3 F (36.3 C)   Wt 73.9 kg   SpO2 96%   BMI 29.81 kg/m  General:   Alert,  pleasant and cooperative in NAD Head:  Normocephalic and atraumatic. Respiratory:  Normal work of breathing. Cardiovascular:  RRR  Impression/Plan: Kristin Wise is here for cataract surgery.  Risks, benefits, limitations, and alternatives regarding cataract surgery have been reviewed with the patient.  Questions have been answered.  All parties agreeable.   Birder Robson, MD  03/02/2021, 8:14 AM

## 2021-03-02 NOTE — Anesthesia Preprocedure Evaluation (Signed)
Anesthesia Evaluation  Patient identified by MRN, date of birth, ID band Patient awake    Reviewed: Allergy & Precautions, NPO status , Patient's Chart, lab work & pertinent test results  Airway Mallampati: II  TM Distance: >3 FB Neck ROM: Full    Dental no notable dental hx.    Pulmonary sleep apnea ,    Pulmonary exam normal breath sounds clear to auscultation       Cardiovascular hypertension, Normal cardiovascular exam Rhythm:Regular Rate:Normal     Neuro/Psych CVA (balance), Residual Symptoms    GI/Hepatic   Endo/Other    Renal/GU      Musculoskeletal   Abdominal   Peds  Hematology  (+) Blood dyscrasia (MGUS), ,   Anesthesia Other Findings   Reproductive/Obstetrics                             Anesthesia Physical Anesthesia Plan  ASA: 2  Anesthesia Plan: MAC   Post-op Pain Management:    Induction: Intravenous  PONV Risk Score and Plan: 2 and Midazolam, TIVA and Treatment may vary due to age or medical condition  Airway Management Planned:   Additional Equipment:   Intra-op Plan:   Post-operative Plan:   Informed Consent: I have reviewed the patients History and Physical, chart, labs and discussed the procedure including the risks, benefits and alternatives for the proposed anesthesia with the patient or authorized representative who has indicated his/her understanding and acceptance.       Plan Discussed with: CRNA  Anesthesia Plan Comments:         Anesthesia Quick Evaluation

## 2021-03-02 NOTE — Anesthesia Postprocedure Evaluation (Signed)
Anesthesia Post Note  Patient: Kristin Wise  Procedure(s) Performed: CATARACT EXTRACTION PHACO AND INTRAOCULAR LENS PLACEMENT (IOC) LEFT 6.40 00:35.2 (Left: Eye)     Patient location during evaluation: PACU Anesthesia Type: MAC Level of consciousness: awake and alert Pain management: pain level controlled Vital Signs Assessment: post-procedure vital signs reviewed and stable Respiratory status: spontaneous breathing, nonlabored ventilation and respiratory function stable Cardiovascular status: stable and blood pressure returned to baseline Postop Assessment: no apparent nausea or vomiting Anesthetic complications: no   No notable events documented.  [name]

## 2021-03-03 ENCOUNTER — Encounter: Payer: Self-pay | Admitting: Anesthesiology

## 2021-03-03 ENCOUNTER — Ambulatory Visit: Payer: Self-pay | Admitting: Anesthesiology

## 2021-03-03 ENCOUNTER — Encounter: Payer: Self-pay | Admitting: Ophthalmology

## 2021-03-08 DIAGNOSIS — H2511 Age-related nuclear cataract, right eye: Secondary | ICD-10-CM | POA: Diagnosis not present

## 2021-03-11 ENCOUNTER — Encounter: Payer: Self-pay | Admitting: Ophthalmology

## 2021-03-16 ENCOUNTER — Ambulatory Visit
Admission: RE | Admit: 2021-03-16 | Discharge: 2021-03-16 | Disposition: A | Discharge status: Home / Self Care | Payer: Medicare HMO | Attending: Ophthalmology | Admitting: Ophthalmology

## 2021-03-16 ENCOUNTER — Encounter
Admission: RE | Disposition: A | Discharge status: Home / Self Care | Payer: Self-pay | Source: Home / Self Care | Attending: Ophthalmology

## 2021-03-16 ENCOUNTER — Other Ambulatory Visit: Payer: Self-pay

## 2021-03-16 ENCOUNTER — Encounter: Payer: Self-pay | Admitting: Ophthalmology

## 2021-03-16 DIAGNOSIS — I10 Essential (primary) hypertension: Secondary | ICD-10-CM | POA: Insufficient documentation

## 2021-03-16 DIAGNOSIS — Z806 Family history of leukemia: Secondary | ICD-10-CM | POA: Insufficient documentation

## 2021-03-16 DIAGNOSIS — Z8051 Family history of malignant neoplasm of kidney: Secondary | ICD-10-CM | POA: Insufficient documentation

## 2021-03-16 DIAGNOSIS — Z7982 Long term (current) use of aspirin: Secondary | ICD-10-CM | POA: Diagnosis not present

## 2021-03-16 DIAGNOSIS — Z8042 Family history of malignant neoplasm of prostate: Secondary | ICD-10-CM | POA: Diagnosis not present

## 2021-03-16 DIAGNOSIS — Z800 Family history of malignant neoplasm of digestive organs: Secondary | ICD-10-CM | POA: Diagnosis not present

## 2021-03-16 DIAGNOSIS — Z882 Allergy status to sulfonamides status: Secondary | ICD-10-CM | POA: Diagnosis not present

## 2021-03-16 DIAGNOSIS — G4730 Sleep apnea, unspecified: Secondary | ICD-10-CM | POA: Diagnosis not present

## 2021-03-16 DIAGNOSIS — Z8673 Personal history of transient ischemic attack (TIA), and cerebral infarction without residual deficits: Secondary | ICD-10-CM | POA: Insufficient documentation

## 2021-03-16 DIAGNOSIS — Z791 Long term (current) use of non-steroidal anti-inflammatories (NSAID): Secondary | ICD-10-CM | POA: Insufficient documentation

## 2021-03-16 DIAGNOSIS — Z888 Allergy status to other drugs, medicaments and biological substances status: Secondary | ICD-10-CM | POA: Insufficient documentation

## 2021-03-16 DIAGNOSIS — H2511 Age-related nuclear cataract, right eye: Secondary | ICD-10-CM | POA: Insufficient documentation

## 2021-03-16 DIAGNOSIS — Z885 Allergy status to narcotic agent status: Secondary | ICD-10-CM | POA: Insufficient documentation

## 2021-03-16 DIAGNOSIS — Z803 Family history of malignant neoplasm of breast: Secondary | ICD-10-CM | POA: Diagnosis not present

## 2021-03-16 HISTORY — PX: CATARACT EXTRACTION W/PHACO: SHX586

## 2021-03-16 SURGERY — PHACOEMULSIFICATION, CATARACT, WITH IOL INSERTION
Anesthesia: Topical | Site: Eye | Laterality: Right

## 2021-03-16 MED ORDER — BRIMONIDINE TARTRATE-TIMOLOL 0.2-0.5 % OP SOLN
OPHTHALMIC | Status: DC | PRN
  Administered 2021-03-16: 1 [drp] via OPHTHALMIC

## 2021-03-16 MED ORDER — MOXIFLOXACIN HCL 0.5 % OP SOLN
OPHTHALMIC | Status: DC | PRN
  Administered 2021-03-16: 0.2 mL via OPHTHALMIC

## 2021-03-16 MED ORDER — SIGHTPATH DOSE#1 BSS IO SOLN
INTRAOCULAR | Status: DC | PRN
  Administered 2021-03-16: 15 mL

## 2021-03-16 MED ORDER — TETRACAINE HCL 0.5 % OP SOLN
1.0000 [drp] | OPHTHALMIC | Status: DC | PRN
  Administered 2021-03-16 (×3): 1 [drp] via OPHTHALMIC

## 2021-03-16 MED ORDER — SIGHTPATH DOSE#1 BSS IO SOLN
INTRAOCULAR | Status: DC | PRN
  Administered 2021-03-16: 53 mL via OPHTHALMIC

## 2021-03-16 MED ORDER — PHENYLEPHRINE HCL 10 % OP SOLN
1.0000 [drp] | OPHTHALMIC | Status: DC | PRN
  Administered 2021-03-16 (×3): 1 [drp] via OPHTHALMIC

## 2021-03-16 MED ORDER — CYCLOPENTOLATE HCL 2 % OP SOLN
1.0000 [drp] | OPHTHALMIC | Status: DC | PRN
  Administered 2021-03-16 (×3): 1 [drp] via OPHTHALMIC

## 2021-03-16 MED ORDER — SIGHTPATH DOSE#1 NA CHONDROIT SULF-NA HYALURON 40-17 MG/ML IO SOLN
INTRAOCULAR | Status: DC | PRN
  Administered 2021-03-16: 1 mL via INTRAOCULAR

## 2021-03-16 MED ORDER — SIGHTPATH DOSE#1 BSS IO SOLN
INTRAOCULAR | Status: DC | PRN
  Administered 2021-03-16: 1 mL

## 2021-03-16 SURGICAL SUPPLY — 18 items
CANNULA ANT/CHMB 27G (MISCELLANEOUS) ×2 IMPLANT
CANNULA ANT/CHMB 27GA (MISCELLANEOUS) ×4 IMPLANT
GLOVE SURG ENC TEXT LTX SZ8 (GLOVE) ×3 IMPLANT
GLOVE SURG TRIUMPH 8.0 PF LTX (GLOVE) ×2 IMPLANT
GOWN STRL REUS W/ TWL LRG LVL3 (GOWN DISPOSABLE) ×2 IMPLANT
GOWN STRL REUS W/TWL LRG LVL3 (GOWN DISPOSABLE) ×4
LENS IOL TECNIS EYHANCE 21.0 (Intraocular Lens) ×1 IMPLANT
MARKER SKIN DUAL TIP RULER LAB (MISCELLANEOUS) ×2 IMPLANT
NDL FILTER BLUNT 18X1 1/2 (NEEDLE) ×1 IMPLANT
NEEDLE FILTER BLUNT 18X 1/2SAF (NEEDLE) ×1
NEEDLE FILTER BLUNT 18X1 1/2 (NEEDLE) ×1 IMPLANT
PACK EYE AFTER SURG (MISCELLANEOUS) ×2 IMPLANT
SUT ETHILON 10-0 CS-B-6CS-B-6 (SUTURE)
SUTURE EHLN 10-0 CS-B-6CS-B-6 (SUTURE) IMPLANT
SYR 3ML LL SCALE MARK (SYRINGE) ×2 IMPLANT
SYR TB 1ML LUER SLIP (SYRINGE) ×2 IMPLANT
WATER STERILE IRR 250ML POUR (IV SOLUTION) ×2 IMPLANT
WIPE NON LINTING 3.25X3.25 (MISCELLANEOUS) ×2 IMPLANT

## 2021-03-16 NOTE — H&P (Signed)
[facility]   Primary Care Physician:  Maryland Pink, MD Ophthalmologist: Dr. George Ina  Pre-Procedure History & Physical: HPI:  Kristin Wise is a 80 y.o. female here for cataract surgery.   Past Medical History:  Diagnosis Date   Cancer ([place])    basal cell   Change in bowel habits    Colon polyps    Femur fracture, left (HCC)    Hyperlipidemia    Hypertension    controlled with medication;    MGUS (monoclonal gammopathy of unknown significance)    Osteoporosis    hasn't been checked in a while; unsure how severe;    Postmenopausal    Rectal bleeding    Seasonal allergies    Sleep apnea    Stroke [facility]) ??   Vitamin D deficiency     Past Surgical History:  Procedure Laterality Date   BREAST BIOPSY Left 09/30/2008   Patient presented with acute swelling in the breast, aspiration culture negative.  Subsequent core biopsy showing fragments of cyst wall with granulation tissue, fibrosis and focal residual epithelial lining.  No malignancy.   broken arm (right)     broken collar bone sugery     CATARACT EXTRACTION W/PHACO Left 03/02/2021   Procedure: CATARACT EXTRACTION PHACO AND INTRAOCULAR LENS PLACEMENT (IOC) LEFT 6.40 00:35.2;  Surgeon: Birder Robson, MD;  Location: [place];  Service: Ophthalmology;  Laterality: Left;   COLONOSCOPY WITH PROPOFOL N/A 02/05/2015   Procedure: COLONOSCOPY WITH PROPOFOL;  Surgeon: Manya Silvas, MD;  Location: [facility] ENDOSCOPY;  Service: Endoscopy;  Laterality: N/A;   COLONOSCOPY WITH PROPOFOL N/A 04/08/2020   Procedure: COLONOSCOPY WITH PROPOFOL;  Surgeon: Toledo, Benay Pike, MD;  Location: [facility] ENDOSCOPY;  Service: Gastroenterology;  Laterality: N/A;   FEMUR FRACTURE SURGERY Left 04/18/2015   FRACTURE SURGERY     TONSILLECTOMY      Prior to Admission medications   Medication Sig Start Date End Date Taking? Authorizing Provider  Alpha-Lipoic Acid 600 MG TABS Take by mouth.   Yes [provider]  aspirin 81  MG chewable tablet Chew by mouth daily.   Yes [provider]  Biotin 5 MG TBDP Take by mouth.   Yes [provider]  Calcium Citrate-Vitamin D 315-250 MG-UNIT TABS Take 1 tablet by mouth 2 (two) times daily.   Yes [provider]  ibuprofen (ADVIL,MOTRIN) 200 MG tablet Take 1 tablet by mouth as needed. 03/05/15  Yes [provider]  L-Lysine 500 MG TABS Take 2 tablets by mouth 4 (four) times daily as needed (mouth sores).    Yes [provider]  losartan-hydrochlorothiazide (HYZAAR) 50-12.5 MG tablet Take 0.5 tablets by mouth daily.    Yes [provider]  Lutein 10 MG TABS Take by mouth.   Yes [provider]  rosuvastatin (CRESTOR) 10 MG tablet Take by mouth.   Yes [provider]  Vitamin D, Ergocalciferol, (DRISDOL) 50000 UNITS CAPS capsule Take 1 capsule by mouth once a week.   Yes [provider]  COVID-19 mRNA Vac-TriS, Pfizer, SUSP injection USE AS DIRECTED 11/27/20 11/27/21  Carlyle Basques, MD    Allergies as of 01/07/2021 - Review Complete 07/03/2020  Allergen Reaction Noted   Mold extract [trichophyton] Other (See Comments) 02/03/2015   Codeine  01/15/2015   Indomethacin  01/15/2015   Other  01/15/2015   Sulfa antibiotics Hives 12/31/2014   Nickel Rash 11/16/2015   Sulfamethoxazole-trimethoprim Rash 11/16/2015    Family History  Problem Relation Age of Onset  Breast cancer Cousin        paternal   Kidney cancer Cousin        maternal   Leukemia Cousin        maternal   Stomach cancer Other        uncle   Prostate cancer Other        grandfather   Skin cancer Other        maternal grandparents    Social History   Socioeconomic History   Marital status: Married    Spouse name: Not on file   Number of children: Not on file   Years of education: Not on file   Highest education level: Not on file  Occupational History   Not on file  Tobacco Use   Smoking status: Never   Smokeless  tobacco: Never  Vaping Use   Vaping Use: Never used  Substance and Sexual Activity   Alcohol use: No   Drug use: No   Sexual activity: Not on file  Other Topics Concern   Not on file  Social History Narrative   Not on file   Social Determinants of Health   Financial Resource Strain: Not on file  Food Insecurity: Not on file  Transportation Needs: Not on file  Physical Activity: Not on file  Stress: Not on file  Social Connections: Not on file  Intimate Partner Violence: Not on file    Review of Systems: See HPI, otherwise negative ROS  Physical Exam: BP (!) 147/61   Pulse 72   Temp 97.9 F (36.6 C)   Resp 16   Ht 5\' 5"  (1.651 m)   Wt 74.4 kg   SpO2 96%   BMI 27.29 kg/m  General:   Alert, cooperative in NAD Head:  Normocephalic and atraumatic. Respiratory:  Normal work of breathing. Cardiovascular:  RRR  Impression/Plan: Kristin Wise is here for cataract surgery.  Risks, benefits, limitations, and alternatives regarding cataract surgery have been reviewed with the patient.  Questions have been answered.  All parties agreeable.   Birder Robson, MD  03/16/2021, 8:15 AM

## 2021-03-16 NOTE — Discharge Instructions (Signed)

## 2021-03-16 NOTE — OR Nursing (Signed)
Pre op vital signs @ 0826  B/P 141/63 P  68 O2  98 Resp  17   Post Op  BP  145/62 P  65 O2  98 Resp  16   Vitals taken by T. Turner, RN, CNOR

## 2021-03-16 NOTE — Op Note (Signed)
PREOPERATIVE DIAGNOSIS:  Nuclear sclerotic cataract of the right eye.   POSTOPERATIVE DIAGNOSIS:  Cataract   OPERATIVE PROCEDURE:ORPROCALL@   SURGEON:  Birder Robson, MD.   ANESTHESIA:  No anesthesia staff entered.  1.      Managed anesthesia care. 2.      0.91ml of Shugarcaine was instilled in the eye following the paracentesis.   COMPLICATIONS:  None.   TECHNIQUE:   Stop and chop   DESCRIPTION OF PROCEDURE:  The patient was examined and consented in the preoperative holding area where the aforementioned topical anesthesia was applied to the right eye and then brought back to the Operating Room where the right eye was prepped and draped in the usual sterile ophthalmic fashion and a lid speculum was placed. A paracentesis was created with the side port blade and the anterior chamber was filled with viscoelastic. A near clear corneal incision was performed with the steel keratome. A continuous curvilinear capsulorrhexis was performed with a cystotome followed by the capsulorrhexis forceps. Hydrodissection and hydrodelineation were carried out with BSS on a blunt cannula. The lens was removed in a stop and chop  technique and the remaining cortical material was removed with the irrigation-aspiration handpiece. The capsular bag was inflated with viscoelastic and the Technis [id number]  lens was placed in the capsular bag without complication. The remaining viscoelastic was removed from the eye with the irrigation-aspiration handpiece. The wounds were hydrated. The anterior chamber was flushed with BSS and the eye was inflated to physiologic pressure. 0.50ml of Vigamox was placed in the anterior chamber. The wounds were found to be water tight. The eye was dressed with Combigan. The patient was given protective glasses to wear throughout the day and a shield with which to sleep tonight. The patient was also given drops with which to begin a drop regimen today and will follow-up with me in one  day. Implant Name Type Inv. Item Serial No. Manufacturer Lot No. LRB No. Used Action  LENS IOL TECNIS EYHANCE 21.0 - [id number] Intraocular Lens LENS IOL TECNIS EYHANCE 21.0 [id number] JOHNSON   Right 1 Implanted   Procedure(s) with comments: CATARACT EXTRACTION PHACO AND INTRAOCULAR LENS PLACEMENT (IOC) RIGHT (Right) - 7.42 0:51.0  Electronically signed: Birder Robson 03/16/2021 8:41 AM

## 2021-03-17 ENCOUNTER — Encounter: Payer: Self-pay | Admitting: Ophthalmology

## 2021-05-26 DIAGNOSIS — I10 Essential (primary) hypertension: Secondary | ICD-10-CM | POA: Diagnosis not present

## 2021-05-26 DIAGNOSIS — E785 Hyperlipidemia, unspecified: Secondary | ICD-10-CM | POA: Diagnosis not present

## 2021-06-03 DIAGNOSIS — Z961 Presence of intraocular lens: Secondary | ICD-10-CM | POA: Diagnosis not present

## 2021-06-10 DIAGNOSIS — D472 Monoclonal gammopathy: Secondary | ICD-10-CM | POA: Diagnosis not present

## 2021-06-11 ENCOUNTER — Encounter: Payer: Self-pay | Admitting: Internal Medicine

## 2021-06-14 ENCOUNTER — Encounter: Payer: Self-pay | Admitting: Internal Medicine

## 2021-06-15 ENCOUNTER — Encounter: Payer: Self-pay | Admitting: Internal Medicine

## 2021-07-02 ENCOUNTER — Inpatient Hospital Stay: Payer: Medicare HMO | Attending: Internal Medicine | Admitting: Internal Medicine

## 2021-07-02 ENCOUNTER — Other Ambulatory Visit: Payer: Self-pay

## 2021-07-02 ENCOUNTER — Other Ambulatory Visit: Payer: Self-pay | Admitting: Family Medicine

## 2021-07-02 DIAGNOSIS — Z1231 Encounter for screening mammogram for malignant neoplasm of breast: Secondary | ICD-10-CM

## 2021-07-02 DIAGNOSIS — Z7982 Long term (current) use of aspirin: Secondary | ICD-10-CM | POA: Diagnosis not present

## 2021-07-02 DIAGNOSIS — D472 Monoclonal gammopathy: Secondary | ICD-10-CM | POA: Diagnosis not present

## 2021-07-02 DIAGNOSIS — Z79899 Other long term (current) drug therapy: Secondary | ICD-10-CM | POA: Diagnosis not present

## 2021-07-02 NOTE — Assessment & Plan Note (Addendum)
#  IgA lambda MGUS [since 2016]- OCT 2022- 1.1kappa/lamda-N [[facility]]; CBC/CMP-N. STABLE.   # MGUS-long discussion with the patient regarding natural history of MGUS; small risk of progression to multiple myeloma. Patient is less likely at this time patient has any active myeloma.  Again reviewed the potential signs and symptoms of multiple myeloma.   # Relative hypogammaglobinemia no symptoms; discussed at length that this is part of the myeloma spectrum.  Would not recommend any IVIG infusions unless patient is having any repeated infectious episodes.  # Vaccination: s/p Flu/ pneumonia/COVID/shingles/hepatitis/Shingles vaccination recommend speaking PCP re: meningitis vaccination   # DISPOSITION:  # follow-up in 12 months; MD; labs- [cbc/cmp myeloma; k/l labs done through [place]- 2 weeks prior to the visit.-Dr.B  # 25 minutes face-to-face with the patient discussing the above plan of care; more than 50% of time spent on prognosis/ natural history; counseling and coordination.   Cc; Dr.Hedrick-

## 2021-07-02 NOTE — Progress Notes (Signed)
[place] OFFICE PROGRESS NOTE  Patient Care Team: Kristin Pink, [name]   SUMMARY OF ONCOLOGIC HISTORY:  # July 2016-M-protein-IgA Lamda- MGUS: NOV 2016- M spike- 1gm/dl; IgA- 1898 [total]; K/L=N; AUG 2016- Skeletal survey-Normal;  # Stroke- ? [Dr.Shah; 1884]   INTERVAL HISTORY: Alone.  Ambulating independently.  80 year old female patient with above history of MGUS is here for follow-up.  Patient denies any unusual tingling and numbness in extremities.  Has any fevers or chills but no nausea vomiting pain no lumps or bumps.  Chronic joint pains not any worse.   Review of Systems  Constitutional:  Negative for chills, diaphoresis, fever, malaise/fatigue and weight loss.  HENT:  Negative for nosebleeds and sore throat.   Eyes:  Negative for double vision.  Respiratory:  Negative for cough, hemoptysis, sputum production, shortness of breath and wheezing.   Cardiovascular:  Negative for chest pain, palpitations, orthopnea and leg swelling.  Gastrointestinal:  Negative for abdominal pain, blood in stool, constipation, diarrhea, heartburn, melena, nausea and vomiting.  Genitourinary:  Negative for dysuria, frequency and urgency.  Musculoskeletal:  Positive for back pain and joint pain.  Skin: Negative.  Negative for itching and rash.  Neurological:  Negative for dizziness, tingling, focal weakness, weakness and headaches.  Endo/Heme/Allergies:  Does not bruise/bleed easily.  Psychiatric/Behavioral:  Negative for depression. The patient is not nervous/anxious and does not have insomnia.     PAST MEDICAL HISTORY :  Past Medical History:  Diagnosis Date   Cancer ([place])    basal cell   Change in bowel habits    Colon polyps    Femur fracture, left (HCC)    Hyperlipidemia    Hypertension    controlled with medication;    MGUS (monoclonal gammopathy of unknown significance)    Osteoporosis    hasn't been checked in a while; unsure  how severe;    Postmenopausal    Rectal bleeding    Seasonal allergies    Sleep apnea    Stroke [facility]) ??   Vitamin D deficiency     PAST SURGICAL HISTORY :   Past Surgical History:  Procedure Laterality Date   BREAST BIOPSY Left 09/30/2008   Patient presented with acute swelling in the breast, aspiration culture negative.  Subsequent core biopsy showing fragments of cyst wall with granulation tissue, fibrosis and focal residual epithelial lining.  No malignancy.   broken arm (right)     broken collar bone sugery     CATARACT EXTRACTION W/PHACO Left 03/02/2021   Procedure: CATARACT EXTRACTION PHACO AND INTRAOCULAR LENS PLACEMENT (IOC) LEFT 6.40 00:35.2;  Surgeon: Birder Robson, MD;  Location: [place];  Service: Ophthalmology;  Laterality: Left;   CATARACT EXTRACTION W/PHACO Right 03/16/2021   Procedure: CATARACT EXTRACTION PHACO AND INTRAOCULAR LENS PLACEMENT ([place]) RIGHT;  Surgeon: Birder Robson, MD;  Location: [place];  Service: Ophthalmology;  Laterality: Right;  7.42 0:51.0   COLONOSCOPY WITH PROPOFOL N/A 02/05/2015   Procedure: COLONOSCOPY WITH PROPOFOL;  Surgeon: Manya Silvas, MD;  Location: [facility] ENDOSCOPY;  Service: Endoscopy;  Laterality: N/A;   COLONOSCOPY WITH PROPOFOL N/A 04/08/2020   Procedure: COLONOSCOPY WITH PROPOFOL;  Surgeon: Toledo, Benay Pike, MD;  Location: [facility] ENDOSCOPY;  Service: Gastroenterology;  Laterality: N/A;   FEMUR FRACTURE SURGERY Left 04/18/2015   FRACTURE SURGERY     TONSILLECTOMY      FAMILY HISTORY :   Family History  Problem Relation Age of Onset   Breast cancer  Cousin        paternal   Kidney cancer Cousin        maternal   Leukemia Cousin        maternal   Stomach cancer Other        uncle   Prostate cancer Other        grandfather   Skin cancer Other        maternal grandparents    SOCIAL HISTORY:   Social History   Tobacco Use   Smoking status: Never   Smokeless tobacco: Never  Vaping Use   Vaping  Use: Never used  Substance Use Topics   Alcohol use: No   Drug use: No    ALLERGIES:  is allergic to mold extract [trichophyton], codeine, indomethacin, other, sulfa antibiotics, nickel, and sulfamethoxazole-trimethoprim.  MEDICATIONS:  Current Outpatient Medications  Medication Sig Dispense Refill   Alpha-Lipoic Acid 600 MG TABS Take by mouth.     aspirin 81 MG chewable tablet Chew by mouth daily.     Biotin 5 MG TBDP Take by mouth.     Calcium Citrate-Vitamin D 315-250 MG-UNIT TABS Take 1 tablet by mouth 2 (two) times daily.     ibuprofen (ADVIL,MOTRIN) 200 MG tablet Take 1 tablet by mouth as needed.     L-Lysine 500 MG TABS Take 2 tablets by mouth 4 (four) times daily as needed (mouth sores).      losartan-hydrochlorothiazide (HYZAAR) 50-12.5 MG tablet Take 0.5 tablets by mouth daily.      Lutein 10 MG TABS Take by mouth.     rosuvastatin (CRESTOR) 10 MG tablet Take by mouth.     Vitamin D, Ergocalciferol, (DRISDOL) 50000 UNITS CAPS capsule Take 1 capsule by mouth once a week.     COVID-19 mRNA Vac-TriS, Pfizer, SUSP injection USE AS DIRECTED .3 mL 0   No current facility-administered medications for this visit.    PHYSICAL EXAMINATION:   BP 133/65   Pulse 66   Temp 98.5 F (36.9 C)   Resp 16   Wt 163 lb 6.4 oz (74.1 kg)   SpO2 97%   BMI 27.19 kg/m   Filed Weights   07/02/21 1325  Weight: 163 lb 6.4 oz (74.1 kg)    Physical Exam HENT:     Head: Normocephalic and atraumatic.     Mouth/Throat:     Pharynx: No oropharyngeal exudate.  Eyes:     Pupils: Pupils are equal, round, and reactive to light.  Cardiovascular:     Rate and Rhythm: Normal rate and regular rhythm.  Pulmonary:     Effort: No respiratory distress.     Breath sounds: No wheezing.  Abdominal:     General: Bowel sounds are normal. There is no distension.     Palpations: Abdomen is soft. There is no mass.     Tenderness: There is no abdominal tenderness. There is no guarding or rebound.   Musculoskeletal:        General: No tenderness. Normal range of motion.     Cervical back: Normal range of motion and neck supple.  Skin:    General: Skin is warm.  Neurological:     Mental Status: She is alert and oriented to person, place, and time.  Psychiatric:        Mood and Affect: Affect normal.     LABORATORY DATA:  I have reviewed the data as listed    Component Value Date/Time   NA 140 09/05/2018 0730  NA 141 09/29/2011 1424   K 4.0 09/05/2018 0730   K 3.9 09/29/2011 1424   CL 107 09/05/2018 0730   CL 100 09/29/2011 1424   CO2 25 09/05/2018 0730   CO2 25 09/29/2011 1424   GLUCOSE 125 (H) 09/05/2018 0730   GLUCOSE 172 (H) 09/29/2011 1424   BUN 23 09/05/2018 0730   BUN 19 (H) 09/29/2011 1424   CREATININE 0.69 09/05/2018 0730   CREATININE 0.79 09/29/2011 1424   CALCIUM 9.0 09/05/2018 0730   CALCIUM 10.1 09/29/2011 1424   PROT 7.3 09/05/2018 0730   PROT 8.5 (H) 09/29/2011 1424   ALBUMIN 3.5 09/05/2018 0730   ALBUMIN 4.1 09/29/2011 1424   AST 30 09/05/2018 0730   AST 24 09/29/2011 1424   ALT 22 09/05/2018 0730   ALT 12 09/29/2011 1424   ALKPHOS 83 09/05/2018 0730   ALKPHOS 76 09/29/2011 1424   BILITOT 0.8 09/05/2018 0730   BILITOT 0.8 09/29/2011 1424   GFRNONAA >60 09/05/2018 0730   GFRNONAA >60 09/29/2011 1424   GFRAA >60 09/05/2018 0730   GFRAA >60 09/29/2011 1424    No results found for: SPEP, UPEP  Lab Results  Component Value Date   WBC 6.5 09/05/2018   NEUTROABS 4.2 09/05/2018   HGB 10.9 (L) 09/05/2018   HCT 34.1 (L) 09/05/2018   MCV 89.3 09/05/2018   PLT 242 09/05/2018      Chemistry      Component Value Date/Time   NA 140 09/05/2018 0730   NA 141 09/29/2011 1424   K 4.0 09/05/2018 0730   K 3.9 09/29/2011 1424   CL 107 09/05/2018 0730   CL 100 09/29/2011 1424   CO2 25 09/05/2018 0730   CO2 25 09/29/2011 1424   BUN 23 09/05/2018 0730   BUN 19 (H) 09/29/2011 1424   CREATININE 0.69 09/05/2018 0730   CREATININE 0.79 09/29/2011  1424      Component Value Date/Time   CALCIUM 9.0 09/05/2018 0730   CALCIUM 10.1 09/29/2011 1424   ALKPHOS 83 09/05/2018 0730   ALKPHOS 76 09/29/2011 1424   AST 30 09/05/2018 0730   AST 24 09/29/2011 1424   ALT 22 09/05/2018 0730   ALT 12 09/29/2011 1424   BILITOT 0.8 09/05/2018 0730   BILITOT 0.8 09/29/2011 1424        ASSESSMENT & PLAN:   MGUS (monoclonal gammopathy of unknown significance) # IgA lambda MGUS- OCT 2022- 1.1 [additional 0.1gm/dal kappa/lamda-N [[facility]]; CBC/CMP-N. STABLE.   # MGUS-long discussion with the patient regarding natural history of MGUS; small risk of progression to multiple myeloma. Patient is less likely at this time patient has any active myeloma.   #   # Relative hypogammaglobinemia no symptoms; discussed at length that this is part of the myeloma spectrum.  Would not recommend any IVIG infusions unless patient is having any repeated infectious episodes.  # Vaccination: s/p Flu/ pneumonia/COVID/shingles/hepatitis/Shingles vaccination recommend speaking PCP re: meningitis vaccination   # DISPOSITION:  # follow-up in 12 months; MD; labs- [cbc/cmp myeloma; k/l labs done through [facility]- 2 weeks prior to the visit.-Dr.B  Cc; Dr.Hedrick-     Cammie Sickle, MD 07/02/2021 4:29 PM

## 2021-07-02 NOTE — Progress Notes (Signed)
Pt would like to know more about immunoglobulins and if it is safe for her to eat pickled foods. No other concerns at this time

## 2021-07-05 ENCOUNTER — Telehealth: Payer: Self-pay | Admitting: Family Medicine

## 2021-07-05 NOTE — Telephone Encounter (Signed)
Pt has questions about needed/recommended immunizations. Pls. call.

## 2021-07-12 ENCOUNTER — Telehealth: Payer: Self-pay | Admitting: *Deleted

## 2021-07-12 NOTE — Telephone Encounter (Signed)
Opened in error

## 2021-07-19 ENCOUNTER — Ambulatory Visit: Payer: Medicare HMO

## 2021-08-03 NOTE — Telephone Encounter (Signed)
Return call to patient.  She was inquiring about getting a Tetanus vaccine around the time of her Mammogram and criteria she had read of immunity for life for Tetanus? Also coverage for [facility].  I told her she could get the vaccine anytime and that I was not aware of any life time immunity to Tetanus and we would file her [employer] and if it did not cover, she would receive a bill in the mail. Dahlia Bailiff, RN

## 2021-08-11 ENCOUNTER — Ambulatory Visit
Admission: RE | Admit: 2021-08-11 | Discharge: 2021-08-11 | Disposition: A | Discharge status: Home / Self Care | Payer: Medicare HMO | Source: Ambulatory Visit | Attending: Family Medicine | Admitting: Family Medicine

## 2021-08-11 ENCOUNTER — Other Ambulatory Visit: Payer: Self-pay

## 2021-08-11 DIAGNOSIS — Z1231 Encounter for screening mammogram for malignant neoplasm of breast: Secondary | ICD-10-CM | POA: Diagnosis not present

## 2021-08-11 DIAGNOSIS — E785 Hyperlipidemia, unspecified: Secondary | ICD-10-CM | POA: Diagnosis not present

## 2021-08-11 DIAGNOSIS — R739 Hyperglycemia, unspecified: Secondary | ICD-10-CM | POA: Diagnosis not present

## 2021-08-11 DIAGNOSIS — I10 Essential (primary) hypertension: Secondary | ICD-10-CM | POA: Diagnosis not present

## 2021-08-17 ENCOUNTER — Other Ambulatory Visit: Payer: Self-pay | Admitting: Family Medicine

## 2021-08-17 DIAGNOSIS — R928 Other abnormal and inconclusive findings on diagnostic imaging of breast: Secondary | ICD-10-CM

## 2021-08-17 DIAGNOSIS — N6310 Unspecified lump in the right breast, unspecified quadrant: Secondary | ICD-10-CM

## 2021-08-18 DIAGNOSIS — E785 Hyperlipidemia, unspecified: Secondary | ICD-10-CM | POA: Diagnosis not present

## 2021-08-18 DIAGNOSIS — Z0000 Encounter for general adult medical examination without abnormal findings: Secondary | ICD-10-CM | POA: Diagnosis not present

## 2021-08-18 DIAGNOSIS — R7303 Prediabetes: Secondary | ICD-10-CM | POA: Diagnosis not present

## 2021-08-18 DIAGNOSIS — H6123 Impacted cerumen, bilateral: Secondary | ICD-10-CM | POA: Diagnosis not present

## 2021-08-18 DIAGNOSIS — I10 Essential (primary) hypertension: Secondary | ICD-10-CM | POA: Diagnosis not present

## 2021-08-26 ENCOUNTER — Ambulatory Visit
Admission: RE | Admit: 2021-08-26 | Discharge: 2021-08-26 | Disposition: A | Discharge status: Home / Self Care | Payer: Medicare HMO | Source: Ambulatory Visit | Attending: Family Medicine | Admitting: Family Medicine

## 2021-08-26 ENCOUNTER — Other Ambulatory Visit: Payer: Self-pay

## 2021-08-26 DIAGNOSIS — N6310 Unspecified lump in the right breast, unspecified quadrant: Secondary | ICD-10-CM

## 2021-08-26 DIAGNOSIS — R928 Other abnormal and inconclusive findings on diagnostic imaging of breast: Secondary | ICD-10-CM | POA: Insufficient documentation

## 2021-08-26 DIAGNOSIS — R922 Inconclusive mammogram: Secondary | ICD-10-CM | POA: Diagnosis not present

## 2021-09-07 ENCOUNTER — Other Ambulatory Visit: Payer: Self-pay | Admitting: General Surgery

## 2021-09-07 DIAGNOSIS — R928 Other abnormal and inconclusive findings on diagnostic imaging of breast: Secondary | ICD-10-CM | POA: Diagnosis not present

## 2021-09-08 ENCOUNTER — Other Ambulatory Visit: Payer: Self-pay | Admitting: General Surgery

## 2021-09-08 DIAGNOSIS — R928 Other abnormal and inconclusive findings on diagnostic imaging of breast: Secondary | ICD-10-CM

## 2021-09-21 ENCOUNTER — Ambulatory Visit
Admission: RE | Admit: 2021-09-21 | Discharge: 2021-09-21 | Disposition: A | Discharge status: Home / Self Care | Payer: Medicare HMO | Source: Ambulatory Visit | Attending: General Surgery | Admitting: General Surgery

## 2021-09-21 ENCOUNTER — Other Ambulatory Visit: Payer: Self-pay

## 2021-09-21 ENCOUNTER — Other Ambulatory Visit: Payer: Self-pay | Admitting: General Surgery

## 2021-09-21 DIAGNOSIS — C50411 Malignant neoplasm of upper-outer quadrant of right female breast: Secondary | ICD-10-CM | POA: Diagnosis not present

## 2021-09-21 DIAGNOSIS — R928 Other abnormal and inconclusive findings on diagnostic imaging of breast: Secondary | ICD-10-CM

## 2021-09-21 DIAGNOSIS — Z170 Estrogen receptor positive status [ER+]: Secondary | ICD-10-CM | POA: Diagnosis not present

## 2021-09-21 DIAGNOSIS — C50811 Malignant neoplasm of overlapping sites of right female breast: Secondary | ICD-10-CM | POA: Diagnosis not present

## 2021-09-22 ENCOUNTER — Other Ambulatory Visit: Payer: Self-pay | Admitting: General Surgery

## 2021-09-22 DIAGNOSIS — C50919 Malignant neoplasm of unspecified site of unspecified female breast: Secondary | ICD-10-CM

## 2021-09-22 DIAGNOSIS — C50411 Malignant neoplasm of upper-outer quadrant of right female breast: Secondary | ICD-10-CM

## 2021-09-22 HISTORY — PX: BREAST BIOPSY: SHX20

## 2021-09-22 NOTE — Progress Notes (Signed)
Navigation initiated.  Patient has appointment with Dr. Bary Castilla on 09/23/21 at 4:00.  Scheduled for consult with Dr. Rogue Bussing 10/01/21 at 3:00.  She is an established patient of both physicians. History of MGUS. Denies any personal or family history of breast cancer, but she has had multiple breast biopsies related to left breast abscess, and fibrocystic breasts.  States she and her parents have history of skin cancer, and that multiple cousins that have had other cancers.

## 2021-09-23 DIAGNOSIS — C50211 Malignant neoplasm of upper-inner quadrant of right female breast: Secondary | ICD-10-CM | POA: Diagnosis not present

## 2021-09-23 DIAGNOSIS — Z170 Estrogen receptor positive status [ER+]: Secondary | ICD-10-CM | POA: Diagnosis not present

## 2021-09-24 ENCOUNTER — Other Ambulatory Visit: Payer: Self-pay

## 2021-09-24 ENCOUNTER — Ambulatory Visit
Admission: RE | Admit: 2021-09-24 | Discharge: 2021-09-24 | Disposition: A | Discharge status: Home / Self Care | Payer: Medicare HMO | Source: Ambulatory Visit | Attending: General Surgery | Admitting: General Surgery

## 2021-09-24 DIAGNOSIS — C50411 Malignant neoplasm of upper-outer quadrant of right female breast: Secondary | ICD-10-CM | POA: Insufficient documentation

## 2021-09-24 MED ORDER — GADOBUTROL 1 MMOL/ML IV SOLN
7.0000 mL | Freq: Once | INTRAVENOUS | Status: AC | PRN
  Administered 2021-09-24: 7 mL via INTRAVENOUS

## 2021-09-30 ENCOUNTER — Ambulatory Visit: Payer: Medicare HMO | Admitting: Internal Medicine

## 2021-10-01 ENCOUNTER — Inpatient Hospital Stay: Payer: Medicare HMO | Attending: Internal Medicine | Admitting: Internal Medicine

## 2021-10-01 ENCOUNTER — Other Ambulatory Visit: Payer: Self-pay

## 2021-10-01 ENCOUNTER — Encounter: Payer: Self-pay | Admitting: General Surgery

## 2021-10-01 ENCOUNTER — Encounter: Payer: Self-pay | Admitting: Internal Medicine

## 2021-10-01 DIAGNOSIS — Z8042 Family history of malignant neoplasm of prostate: Secondary | ICD-10-CM | POA: Diagnosis not present

## 2021-10-01 DIAGNOSIS — Z803 Family history of malignant neoplasm of breast: Secondary | ICD-10-CM | POA: Insufficient documentation

## 2021-10-01 DIAGNOSIS — Z170 Estrogen receptor positive status [ER+]: Secondary | ICD-10-CM | POA: Diagnosis not present

## 2021-10-01 DIAGNOSIS — D472 Monoclonal gammopathy: Secondary | ICD-10-CM | POA: Diagnosis not present

## 2021-10-01 DIAGNOSIS — Z806 Family history of leukemia: Secondary | ICD-10-CM | POA: Insufficient documentation

## 2021-10-01 DIAGNOSIS — Z840 Family history of diseases of the skin and subcutaneous tissue: Secondary | ICD-10-CM | POA: Insufficient documentation

## 2021-10-01 DIAGNOSIS — Z800 Family history of malignant neoplasm of digestive organs: Secondary | ICD-10-CM | POA: Insufficient documentation

## 2021-10-01 DIAGNOSIS — Z8051 Family history of malignant neoplasm of kidney: Secondary | ICD-10-CM | POA: Insufficient documentation

## 2021-10-01 DIAGNOSIS — C50411 Malignant neoplasm of upper-outer quadrant of right female breast: Secondary | ICD-10-CM

## 2021-10-01 LAB — SURGICAL PATHOLOGY

## 2021-10-01 NOTE — Assessment & Plan Note (Addendum)
#  RIGHT breast IMC with lobular feathureds ER-POSITIVE/PR-NEG; her 2-2+IHC; FISH-PENIDNG; G-1. JAN 2023- MRI-MRI shows 1.8 cm right upper upper quadrant mass [s/p recent biopsy; Dr.Byrnett].  # I had a long discussion with the patient in general regarding the treatment options of breast cancer including-surgery; adjuvant radiation; role of adjuvant systemic therapy including-chemotherapy antihormone therapy.   #Patient leaning towards lumpectomy without sentinel lymph node.  Discussed regarding role of radiation postlumpectomy; patient is reluctant given the potential side effects of radiation.  The role of systemic chemotherapy-is currently unclear; pending Her-2 status.  Patient will need systemic endocrine therapy.   # IgA lambda MGUS [since 2016]- OCT 2022- 1.1- kappa/lamda-N [[facility]]; CBC/CMP-N-given the low risk of progression to multiple myeloma-less likely cause any significant interaction with her other breast cancer treatment plan.  # DISPOSITION:  # follow-up in 1 month-MD; no labs-Dr.B    Cc; Dr.Hedrick-

## 2021-10-01 NOTE — Progress Notes (Signed)
[place] OFFICE PROGRESS NOTE  [name], [name]   SUMMARY OF ONCOLOGIC HISTORY:    Oncology History Overview Note  # JAN 2023- # RIGHT breast [facility] with lobular feathureds ER-POSITIVE/PR-NEG; her 2-2+IHC; FISH-PENIDNG; G-1. JAN 2023- MRI-MRI shows 1.8 cm right upper upper quadrant mass [s/p recent biopsy; Dr.Byrnett].    July 2016-M-protein-IgA Lamda- MGUS: NOV 2016- M spike- 1gm/dl; IgA- 1898 [total]; K/L=N; AUG 2016- Skeletal survey-Normal;  # Stroke- ? [Dr.Shah; 2018]   Carcinoma of upper-outer quadrant of right breast in female, estrogen receptor positive ([place])  10/01/2021 Initial Diagnosis   Carcinoma of upper-outer quadrant of right breast in female, estrogen receptor positive ([place])       INTERVAL HISTORY: Accompanied by her son.  Ambulating independently.  81 year old female patient with above history of MGUS; and a new diagnosis of right breast cancer is here to discuss her treatment options.  Patient had a screening mammogram which was abnormal; this led to diagnostic mammogram biopsy/pathology as summarized above.  Patient evaluated by Dr. Bary Castilla; s/p MRI breast.   Patient denies any unusual tingling and numbness in extremities.  Has any fevers or chills but no nausea vomiting pain no lumps or bumps.  Chronic joint pains not any worse.   Review of Systems  Constitutional:  Negative for chills, diaphoresis, fever, malaise/fatigue and weight loss.  HENT:  Negative for nosebleeds and sore throat.   Eyes:  Negative for double vision.  Respiratory:  Negative for cough, hemoptysis, sputum production, shortness of breath and wheezing.   Cardiovascular:  Negative for chest pain, palpitations, orthopnea and leg swelling.  Gastrointestinal:  Negative for abdominal pain, blood in stool, constipation, diarrhea, heartburn, melena, nausea and vomiting.  Genitourinary:  Negative for dysuria, frequency and  urgency.  Musculoskeletal:  Positive for back pain and joint pain.  Skin: Negative.  Negative for itching and rash.  Neurological:  Negative for dizziness, tingling, focal weakness, weakness and headaches.  Endo/Heme/Allergies:  Does not bruise/bleed easily.  Psychiatric/Behavioral:  Negative for depression. The patient is not nervous/anxious and does not have insomnia.     PAST MEDICAL HISTORY :  Past Medical History:  Diagnosis Date   Breast cancer ([place])    Cancer ([place])    basal cell   Change in bowel habits    Colon polyps    Femur fracture, left (HCC)    Hyperlipidemia    Hypertension    controlled with medication;    MGUS (monoclonal gammopathy of unknown significance)    Osteoporosis    hasn't been checked in a while; unsure how severe;    Postmenopausal    Rectal bleeding    Seasonal allergies    Sleep apnea    Stroke [facility]) ??   Vitamin D deficiency     PAST SURGICAL HISTORY :   Past Surgical History:  Procedure Laterality Date   BREAST BIOPSY Left 09/30/2008   Patient presented with acute swelling in the breast, aspiration culture negative.  Subsequent core biopsy showing fragments of cyst wall with granulation tissue, fibrosis and focal residual epithelial lining.  No malignancy.   broken arm (right)     broken collar bone sugery     CATARACT EXTRACTION W/PHACO Left 03/02/2021   Procedure: CATARACT EXTRACTION PHACO AND INTRAOCULAR LENS PLACEMENT (IOC) LEFT 6.40 00:35.2;  Surgeon: Birder Robson, MD;  Location: [place];  Service: Ophthalmology;  Laterality: Left;   CATARACT EXTRACTION W/PHACO Right 03/16/2021  Procedure: CATARACT EXTRACTION PHACO AND INTRAOCULAR LENS PLACEMENT ([place]) RIGHT;  Surgeon: Birder Robson, MD;  Location: [place];  Service: Ophthalmology;  Laterality: Right;  7.42 0:51.0   COLONOSCOPY WITH PROPOFOL N/A 02/05/2015   Procedure: COLONOSCOPY WITH PROPOFOL;  Surgeon: Manya Silvas, MD;  Location: [facility]COPY;   Service: Endoscopy;  Laterality: N/A;   COLONOSCOPY WITH PROPOFOL N/A 04/08/2020   Procedure: COLONOSCOPY WITH PROPOFOL;  Surgeon: Toledo, Benay Pike, MD;  Location: [facility] ENDOSCOPY;  Service: Gastroenterology;  Laterality: N/A;   FEMUR FRACTURE SURGERY Left 04/18/2015   FRACTURE SURGERY     TONSILLECTOMY      FAMILY HISTORY :   Family History  Problem Relation Age of Onset   Breast cancer Cousin        paternal   Kidney cancer Cousin        maternal   Leukemia Cousin        maternal   Stomach cancer Other        uncle   Prostate cancer Other        grandfather   Skin cancer Other        maternal grandparents    SOCIAL HISTORY:   Social History   Tobacco Use   Smoking status: Never   Smokeless tobacco: Never  Vaping Use   Vaping Use: Never used  Substance Use Topics   Alcohol use: No   Drug use: No    ALLERGIES:  is allergic to mold extract [trichophyton], codeine, indomethacin, other, sulfa antibiotics, nickel, and sulfamethoxazole-trimethoprim.  MEDICATIONS:  Current Outpatient Medications  Medication Sig Dispense Refill   Alpha-Lipoic Acid 600 MG TABS Take by mouth.     aspirin 81 MG chewable tablet Chew by mouth daily.     Biotin 5 MG TBDP Take by mouth.     Calcium Citrate-Vitamin D 315-250 MG-UNIT TABS Take 1 tablet by mouth 2 (two) times daily.     COVID-19 mRNA Vac-TriS, Pfizer, SUSP injection USE AS DIRECTED .3 mL 0   ibuprofen (ADVIL,MOTRIN) 200 MG tablet Take 1 tablet by mouth as needed.     L-Lysine 500 MG TABS Take 2 tablets by mouth 4 (four) times daily as needed (mouth sores).      losartan-hydrochlorothiazide (HYZAAR) 50-12.5 MG tablet Take 0.5 tablets by mouth daily.      Lutein 10 MG TABS Take by mouth.     rosuvastatin (CRESTOR) 10 MG tablet Take by mouth every other day.     Vitamin D, Ergocalciferol, (DRISDOL) 50000 UNITS CAPS capsule Take 1 capsule by mouth once a week.     No current facility-administered medications for this visit.     PHYSICAL EXAMINATION:   BP (!) 150/68 (BP Location: Left Arm, Patient Position: Sitting, Cuff Size: Normal)    Pulse 66    Temp 98.4 F (36.9 C) (Tympanic)    Wt 164 lb 6.4 oz (74.6 kg)    SpO2 98%    BMI 27.36 kg/m   Filed Weights   10/01/21 1519  Weight: 164 lb 6.4 oz (74.6 kg)    Physical Exam HENT:     Head: Normocephalic and atraumatic.     Mouth/Throat:     Pharynx: No oropharyngeal exudate.  Eyes:     Pupils: Pupils are equal, round, and reactive to light.  Cardiovascular:     Rate and Rhythm: Normal rate and regular rhythm.  Pulmonary:     Effort: No respiratory distress.     Breath sounds: No wheezing.  Abdominal:     General: Bowel sounds are normal. There is no distension.     Palpations: Abdomen is soft. There is no mass.     Tenderness: There is no abdominal tenderness. There is no guarding or rebound.  Musculoskeletal:        General: No tenderness. Normal range of motion.     Cervical back: Normal range of motion and neck supple.  Skin:    General: Skin is warm.  Neurological:     Mental Status: She is alert and oriented to person, place, and time.  Psychiatric:        Mood and Affect: Affect normal.     LABORATORY DATA:  I have reviewed the data as listed    Component Value Date/Time   NA 140 09/05/2018 0730   NA 141 09/29/2011 1424   K 4.0 09/05/2018 0730   K 3.9 09/29/2011 1424   CL 107 09/05/2018 0730   CL 100 09/29/2011 1424   CO2 25 09/05/2018 0730   CO2 25 09/29/2011 1424   GLUCOSE 125 (H) 09/05/2018 0730   GLUCOSE 172 (H) 09/29/2011 1424   BUN 23 09/05/2018 0730   BUN 19 (H) 09/29/2011 1424   CREATININE 0.69 09/05/2018 0730   CREATININE 0.79 09/29/2011 1424   CALCIUM 9.0 09/05/2018 0730   CALCIUM 10.1 09/29/2011 1424   PROT 7.3 09/05/2018 0730   PROT 8.5 (H) 09/29/2011 1424   ALBUMIN 3.5 09/05/2018 0730   ALBUMIN 4.1 09/29/2011 1424   AST 30 09/05/2018 0730   AST 24 09/29/2011 1424   ALT 22 09/05/2018 0730   ALT 12  09/29/2011 1424   ALKPHOS 83 09/05/2018 0730   ALKPHOS 76 09/29/2011 1424   BILITOT 0.8 09/05/2018 0730   BILITOT 0.8 09/29/2011 1424   GFRNONAA >60 09/05/2018 0730   GFRNONAA >60 09/29/2011 1424   GFRAA >60 09/05/2018 0730   GFRAA >60 09/29/2011 1424    No results found for: SPEP, UPEP  Lab Results  Component Value Date   WBC 6.5 09/05/2018   NEUTROABS 4.2 09/05/2018   HGB 10.9 (L) 09/05/2018   HCT 34.1 (L) 09/05/2018   MCV 89.3 09/05/2018   PLT 242 09/05/2018      Chemistry      Component Value Date/Time   NA 140 09/05/2018 0730   NA 141 09/29/2011 1424   K 4.0 09/05/2018 0730   K 3.9 09/29/2011 1424   CL 107 09/05/2018 0730   CL 100 09/29/2011 1424   CO2 25 09/05/2018 0730   CO2 25 09/29/2011 1424   BUN 23 09/05/2018 0730   BUN 19 (H) 09/29/2011 1424   CREATININE 0.69 09/05/2018 0730   CREATININE 0.79 09/29/2011 1424      Component Value Date/Time   CALCIUM 9.0 09/05/2018 0730   CALCIUM 10.1 09/29/2011 1424   ALKPHOS 83 09/05/2018 0730   ALKPHOS 76 09/29/2011 1424   AST 30 09/05/2018 0730   AST 24 09/29/2011 1424   ALT 22 09/05/2018 0730   ALT 12 09/29/2011 1424   BILITOT 0.8 09/05/2018 0730   BILITOT 0.8 09/29/2011 1424        ASSESSMENT & PLAN:   Carcinoma of upper-outer quadrant of right breast in female, estrogen receptor positive ([place]) # RIGHT breast IMC with lobular feathureds ER-POSITIVE/PR-NEG; her 2-2+IHC; FISH-PENIDNG; G-1. JAN 2023- MRI-MRI shows 1.8 cm right upper upper quadrant mass [s/p recent biopsy; Dr.Byrnett].  # I had a long discussion with the patient in general regarding the treatment options  of breast cancer including-surgery; adjuvant radiation; role of adjuvant systemic therapy including-chemotherapy antihormone therapy.   #Patient leaning towards lumpectomy without sentinel lymph node.  Discussed regarding role of radiation postlumpectomy; patient is reluctant given the potential side effects of radiation.  The role of systemic  chemotherapy-is currently unclear; pending Her-2 status.  Patient will need systemic endocrine therapy.   # IgA lambda MGUS [since 2016]- OCT 2022- 1.1- kappa/lamda-N [[facility]]; CBC/CMP-N-given the low risk of progression to multiple myeloma-less likely cause any significant interaction with her other breast cancer treatment plan.  # DISPOSITION:  # follow-up in 1 month-MD; no labs-Dr.B    Cc; Dr.Hedrick-     Cammie Sickle, MD 10/01/2021 5:03 PM

## 2021-10-01 NOTE — Progress Notes (Signed)
C/o with how the breast cancer will "play out" with the MGUS.

## 2021-10-07 ENCOUNTER — Other Ambulatory Visit: Payer: Self-pay | Admitting: General Surgery

## 2021-10-07 DIAGNOSIS — C50411 Malignant neoplasm of upper-outer quadrant of right female breast: Secondary | ICD-10-CM

## 2021-10-07 DIAGNOSIS — Z170 Estrogen receptor positive status [ER+]: Secondary | ICD-10-CM | POA: Diagnosis not present

## 2021-10-07 DIAGNOSIS — C50211 Malignant neoplasm of upper-inner quadrant of right female breast: Secondary | ICD-10-CM | POA: Diagnosis not present

## 2021-10-11 ENCOUNTER — Other Ambulatory Visit: Payer: Self-pay | Admitting: General Surgery

## 2021-10-11 DIAGNOSIS — C50411 Malignant neoplasm of upper-outer quadrant of right female breast: Secondary | ICD-10-CM

## 2021-10-11 NOTE — Progress Notes (Signed)
Subjective:     Patient ID: Kristin Wise is a 81 y.o. female.   HPI   The following portions of the patient's history were reviewed and updated as appropriate.   This an established patient is here today for: office visit. Here to discuss management of her recently identified breast cancer.  Since her visit, she has undergone breast biopsy and subsequently a MRI base of the "invasive mammary carcinoma with lobular features".  Histologic grade 1.   Bra Size: 38 D   Review of Systems  Constitutional: Negative for chills and fever.  Respiratory: Negative for cough.          Chief Complaint  Patient presents with   Pre-op Exam      BP 128/72    Pulse 74    Temp 36.8 C (98.3 F)    Ht 157.5 cm (5' 2")    Wt 74.4 kg (164 lb)    SpO2 95%    BMI 30.00 kg/m        Past Medical History:  Diagnosis Date   Bright red rectal bleeding 01/12/2015   Change in bowel habits 01/12/2015   Colon polyp     Hyperlipidemia      Refuses statin therapy. LDL 144- 12/2012   Hypertension     MGUS (monoclonal gammopathy of unknown significance)     Osteoporosis      Refuses bisphosphonate therapy   Seasonal allergies     Sleep apnea     Vitamin D deficiency      Vitamin D level- 22.9 (06/2013)           Past Surgical History:  Procedure Laterality Date   COLONOSCOPY   01/11/2006    FH Colon Polyps (Mother)   COLONOSCOPY   02/15/2011    FH Colon Polyps (Mother): CBF 01/2016   COLONOSCOPY   02/05/2015    Adenomatous Polyp, FH Colon Polyps (Mother): CBF 01/2020    broken femur 3 screws and plate 2016 Left 19/62/2297   COLONOSCOPY   04/08/2020    Diverticulosis/Otherwise normal colon/PHx CP/No Repeat due to age/TKT   ultrasound guided breast biopsy Right 09/21/2020   CATARACT EXTRACTION Left 03/02/2021   CATARACT EXTRACTION Right 03/16/2021   broken arm        right (fell)   broken collar bone        in 1946   Laser surgery to bilateral legs       Left breast biopsy        Benign    TONSILLECTOMY                    OB History     Gravida  1   Para  1   Term      Preterm      AB      Living         SAB      IAB      Ectopic      Molar      Multiple      Live Births           Obstetric Comments  Age at first period 16 Age of first pregnancy 77             Social History           Socioeconomic History   Marital status: Married  Tobacco Use   Smoking status: Never      Passive exposure: Past  Smokeless tobacco: Never  Vaping Use   Vaping Use: Never used  Substance and Sexual Activity   Alcohol use: No   Drug use: No   Sexual activity: Never  Social History Narrative    Marital Status- Married    Lives with husband    Employment- Retired    Exercise hx- None    Religious Affiliation- [employer]             Allergies  Allergen Reactions   Trichophyton Mentagrophytes Allergenic Extract Other (See Comments)      Allergy to it.      Codeine Other (See Comments)      "Feels bad"     Indomethacin Other (See Comments)      "Bad dreams"   Mold Other (See Comments)   Morphine Other (See Comments)      Intolerance   Other Other (See Comments)      Sodium Laurel Sulfate- caused gums to peel, corners of mouth became sore   Sodium Lauryl Sulfate Unknown   Sulfa (Sulfonamide Antibiotics) Other (See Comments) and Rash      Skin redness and "felt terrible"   Sulfasalazine Hives      Feels terrible      Nickel Rash      Current Medications        Current Outpatient Medications  Medication Sig Dispense Refill   alpha lipoic acid 600 mg tablet Take 600 mg by mouth 2 (two) times daily       aspirin 81 MG EC tablet Take 81 mg by mouth once daily       biotin 10,000 mcg Cap Take 1 caplet by mouth once daily       calcium carb,cit-mag cit,glycn 167 mg calcium -83 mg Tab Take 1 tablet by mouth once daily       cholecalciferol (VITAMIN D3) 1000 unit tablet Take by mouth       COLLAGEN MISC Use       ergocalciferol,  vitamin D2, 50,000 unit capsule Take 1 capsule (50,000 Units total) by mouth once a week 52 capsule 0   losartan-hydrochlorothiazide (HYZAAR) 50-12.5 mg tablet Take 1 tablet by mouth once daily 90 tablet 3   LUTEIN ORAL Take 1 tablet by mouth once daily       lysine 1,000 mg Tab Take by mouth once daily as needed.       rosuvastatin (CRESTOR) 5 MG tablet TAKE 1 TABLET EVERY OTHER DAY 45 tablet 3    No current facility-administered medications for this visit.             Family History  Problem Relation Age of Onset   Other Mother          "Heart problems"   Colon polyps Mother     Dementia Mother     Hyperlipidemia (Elevated cholesterol) Mother     High blood pressure (Hypertension) Mother     Other Father          "Heart problems"   Dementia Father     No Known Problems Brother     No Known Problems Brother     Cancer Maternal Grandfather     Hyperlipidemia (Elevated cholesterol) Son     Colon cancer Cousin     Kidney cancer Cousin     Leukemia Cousin     Colon cancer Other          Uncle        Labs and Radiology:  September 21, 2021 pathology:   DIAGNOSIS:  A.  BREAST, RIGHT 9:30 11 CM FN; ULTRASOUND-GUIDED BIOPSY:  - INVASIVE MAMMARY CARCINOMA WITH LOBULAR FEATURES.   Size of invasive carcinoma: 9 mm in this sample  Histologic grade of invasive carcinoma: Grade 1                       Glandular/tubular differentiation score: 3                       Nuclear pleomorphism score: 1                       Mitotic rate score: 1                       Total score: 4  Lymphovascular invasion: Not identified    CASE SUMMARY: BREAST BIOMARKER TESTS  Estrogen Receptor (ER) Status: POSITIVE          Percentage of cells with nuclear positivity: Greater than 90%          Average intensity of staining: Strong   Progesterone Receptor (PgR) Status: NEGATIVE (LESS THAN 1%)          Internal control cells present and stain as expected   HER2 (by immunohistochemistry):  EQUIVOCAL (Score 2+)                       Percentage of cells with uniform intense complete  membrane staining: 0%  HER2 FISH will be performed and reported in an addendum.    Imaging review:   MRI of September 24, 2021:IMPRESSION: Known malignancy measuring 1.8 centimeters in the UPPER-OUTER QUADRANT of the RIGHT breast. There is associated surrounding edema and skin thickening possibly related to the recent biopsy.   No evidence for adenopathy.     Phone conversation with Dr. Rogue Bussing October 07, 2021:   Reviewed pros and cons of sentinel node biopsy.  While the likelihood of finding changing treatment is small with his grade 1 tumor, during attempts to explain the pros and cons of sentinel node biopsy the patient it was recognized that the anxiety of not having it done would outweigh any benefit of avoiding axillary incision.   Limited breast ultrasound of October 07, 2021:   The biopsy site is not clearly identified.  Wire localization will be appropriate prior to the procedure.      Objective:   Physical Exam Constitutional:      Appearance: Normal appearance.  Cardiovascular:     Rate and Rhythm: Normal rate and regular rhythm.     Pulses: Normal pulses.     Heart sounds: Normal heart sounds.  Pulmonary:     Effort: Pulmonary effort is normal.     Breath sounds: Normal breath sounds.  Chest:  Breasts:    Right: Normal.     Left: Normal.  Musculoskeletal:     Cervical back: Neck supple.  Lymphadenopathy:     Upper Body:     Right upper body: No supraclavicular or axillary adenopathy.     Left upper body: No supraclavicular or axillary adenopathy.  Skin:    General: Skin is warm and dry.  Neurological:     Mental Status: She is alert and oriented to person, place, and time.  Psychiatric:        Mood and Affect: Mood normal.        Behavior: Behavior  normal.           Assessment:     Invasive lobular carcinoma of the right breast.    Plan:     Over 30  minutes was spent with the patient reviewing indications for surgery, options for management (mastectomy versus lumpectomy) discussion of sentinel node biopsy in patients over 70 as well as the role of radiation therapy after breast conservation surgery.   The use of Emla cream prior to the procedure was reviewed.      This note is partially prepared by Karie Fetch, RN, acting as a scribe in the presence of Dr. Hervey Ard, MD.  The documentation recorded by the scribe accurately reflects the service I personally performed and the decisions made by me.    Robert Bellow, MD FACS

## 2021-10-22 ENCOUNTER — Other Ambulatory Visit: Payer: Self-pay

## 2021-10-22 ENCOUNTER — Encounter
Admission: RE | Admit: 2021-10-22 | Discharge: 2021-10-22 | Disposition: A | Discharge status: Home / Self Care | Payer: Medicare HMO | Source: Ambulatory Visit | Attending: General Surgery | Admitting: General Surgery

## 2021-10-22 DIAGNOSIS — Z79899 Other long term (current) drug therapy: Secondary | ICD-10-CM

## 2021-10-22 HISTORY — DX: Inflammatory liver disease, unspecified: K75.9

## 2021-10-22 HISTORY — DX: Unspecified osteoarthritis, unspecified site: M19.90

## 2021-10-22 HISTORY — DX: Bronchitis, not specified as acute or chronic: J40

## 2021-10-22 HISTORY — DX: Hypothyroidism, unspecified: E03.9

## 2021-10-22 NOTE — Patient Instructions (Addendum)
Your procedure is scheduled on:11-01-21 Monday Report to [facility] @ 7:45 AM-Then have your driver drive you over to the [facility] and check in at the Radiology desk (2nd desk on right)  REMEMBER: Instructions that are not followed completely may result in serious medical risk, up to and including death; or upon the discretion of your surgeon and anesthesiologist your surgery may need to be rescheduled.  Do not eat food after midnight the night before surgery.  No gum chewing, lozengers or hard candies.  You may however, drink CLEAR liquids up to 2 hours before you are scheduled to arrive for your surgery. Do not drink anything within 2 hours of your scheduled arrival time.  Clear liquids include: - water  - apple juice without pulp - gatorade (not RED colors) - black coffee or tea (Do NOT add milk or creamers to the coffee or tea) Do NOT drink anything that is not on this list.  TAKE THESE MEDICATIONS THE MORNING OF SURGERY WITH A SIP OF WATER: -rosuvastatin (CRESTOR)  Continue your aspirin 81 MG EC tablet up until the day prior to surgery-Do NOT take the day of surgery  One week prior to surgery: Stop Anti-inflammatories (NSAIDS) such as Advil, Aleve, Ibuprofen, Motrin, Naproxen, Naprosyn and Aspirin based products such as Excedrin, Goodys Powder, BC Powder.You may however, continue to take Tylenol if needed for pain up until the day of surgery.  Stop ANY OVER THE COUNTER supplements/vitamins 7 days prior to surgery (Alpha-Lipoic Acid , Biotin, L-Lysine, Lutein ,CALCIUM-VITAMIN D)  No Alcohol for 24 hours before or after surgery.  No Smoking including e-cigarettes for 24 hours prior to surgery.  No chewable tobacco products for at least 6 hours prior to surgery.  No nicotine patches on the day of surgery.  Do not use any "recreational" drugs for at least a week prior to your surgery.  Please be advised that the combination of cocaine and anesthesia may have negative  outcomes, up to and including death. If you test positive for cocaine, your surgery will be cancelled.  On the morning of surgery brush your teeth with toothpaste and water, you may rinse your mouth with mouthwash if you wish. Do not swallow any toothpaste or mouthwash.  Use CHG Soap as directed on instruction sheet.  Do not wear jewelry, make-up, hairpins, clips or nail polish.  Do not wear lotions, powders, or perfumes.   Do not shave body from the neck down 48 hours prior to surgery just in case you cut yourself which could leave a site for infection.  Also, freshly shaved skin may become irritated if using the CHG soap.  Contact lenses, hearing aids and dentures may not be worn into surgery.  Do not bring valuables to the hospital. [facility] is not responsible for any missing/lost belongings or valuables.   Notify your doctor if there is any change in your medical condition (cold, fever, infection).  Wear comfortable clothing (specific to your surgery type) to the hospital.  After surgery, you can help prevent lung complications by doing breathing exercises.  Take deep breaths and cough every 1-2 hours. Your doctor may order a device called an Incentive Spirometer to help you take deep breaths. When coughing or sneezing, hold a pillow firmly against your incision with both hands. This is called splinting. Doing this helps protect your incision. It also decreases belly discomfort.  If you are being admitted to the hospital overnight, leave your suitcase in the car. After surgery  it may be brought to your room.  If you are being discharged the day of surgery, you will not be allowed to drive home. You will need a responsible adult (18 years or older) to drive you home and stay with you that night.   If you are taking public transportation, you will need to have a responsible adult (18 years or older) with you. Please confirm with your physician that it is acceptable to use  public transportation.   Please call the [facility] [place] Dept. at [phone] if you have any questions about these instructions.  Surgery Visitation Policy:  Patients undergoing a surgery or procedure may have one family member or support person with them as long as that person is not COVID-19 positive or experiencing its symptoms.  That person may remain in the waiting area during the procedure and may rotate out with other people.  Inpatient Visitation:    Visiting hours are 7 a.m. to 8 p.m. Up to two visitors ages 16+ are allowed at one time in a patient room. The visitors may rotate out with other people during the day. Visitors must check out when they leave, or other visitors will not be allowed. One designated support person may remain overnight. The visitor must pass COVID-19 screenings, use hand sanitizer when entering and exiting the patients room and wear a mask at all times, including in the patients room. Patients must also wear a mask when staff or their visitor are in the room. Masking is required regardless of vaccination status.

## 2021-10-26 ENCOUNTER — Encounter
Admission: RE | Admit: 2021-10-26 | Discharge: 2021-10-26 | Disposition: A | Discharge status: Home / Self Care | Payer: Medicare HMO | Source: Ambulatory Visit | Attending: General Surgery | Admitting: General Surgery

## 2021-10-26 ENCOUNTER — Other Ambulatory Visit: Payer: Self-pay

## 2021-10-26 DIAGNOSIS — I10 Essential (primary) hypertension: Secondary | ICD-10-CM | POA: Insufficient documentation

## 2021-10-26 DIAGNOSIS — Z01810 Encounter for preprocedural cardiovascular examination: Secondary | ICD-10-CM | POA: Diagnosis not present

## 2021-10-26 DIAGNOSIS — Z01818 Encounter for other preprocedural examination: Secondary | ICD-10-CM | POA: Diagnosis not present

## 2021-10-26 DIAGNOSIS — Z79899 Other long term (current) drug therapy: Secondary | ICD-10-CM | POA: Diagnosis not present

## 2021-10-26 LAB — POTASSIUM: Potassium: 4.1 mmol/L (ref 3.5–5.1)

## 2021-10-27 ENCOUNTER — Telehealth: Payer: Self-pay | Admitting: *Deleted

## 2021-10-27 DIAGNOSIS — H43813 Vitreous degeneration, bilateral: Secondary | ICD-10-CM | POA: Diagnosis not present

## 2021-10-27 NOTE — Telephone Encounter (Signed)
Patient called to inquire about 3/6 appointment she states that she is scheduled for surgery that day and is confused about appointment. ?

## 2021-11-01 ENCOUNTER — Encounter
Admission: RE | Disposition: A | Discharge status: Home / Self Care | Payer: Self-pay | Source: Home / Self Care | Attending: General Surgery

## 2021-11-01 ENCOUNTER — Ambulatory Visit
Admission: RE | Admit: 2021-11-01 | Discharge: 2021-11-01 | Disposition: A | Discharge status: Home / Self Care | Payer: Medicare HMO | Source: Ambulatory Visit | Attending: General Surgery | Admitting: General Surgery

## 2021-11-01 ENCOUNTER — Encounter: Payer: Self-pay | Admitting: General Surgery

## 2021-11-01 ENCOUNTER — Ambulatory Visit: Payer: Medicare HMO | Admitting: Anesthesiology

## 2021-11-01 ENCOUNTER — Other Ambulatory Visit: Payer: Self-pay

## 2021-11-01 ENCOUNTER — Ambulatory Visit: Payer: Medicare HMO | Admitting: Internal Medicine

## 2021-11-01 ENCOUNTER — Ambulatory Visit
Admission: RE | Admit: 2021-11-01 | Discharge: 2021-11-01 | Disposition: A | Discharge status: Home / Self Care | Payer: Medicare HMO | Attending: General Surgery | Admitting: General Surgery

## 2021-11-01 DIAGNOSIS — E785 Hyperlipidemia, unspecified: Secondary | ICD-10-CM | POA: Insufficient documentation

## 2021-11-01 DIAGNOSIS — G4730 Sleep apnea, unspecified: Secondary | ICD-10-CM | POA: Diagnosis not present

## 2021-11-01 DIAGNOSIS — C50411 Malignant neoplasm of upper-outer quadrant of right female breast: Secondary | ICD-10-CM

## 2021-11-01 DIAGNOSIS — Z8619 Personal history of other infectious and parasitic diseases: Secondary | ICD-10-CM | POA: Insufficient documentation

## 2021-11-01 DIAGNOSIS — C50911 Malignant neoplasm of unspecified site of right female breast: Secondary | ICD-10-CM | POA: Diagnosis not present

## 2021-11-01 DIAGNOSIS — I10 Essential (primary) hypertension: Secondary | ICD-10-CM | POA: Diagnosis not present

## 2021-11-01 DIAGNOSIS — E039 Hypothyroidism, unspecified: Secondary | ICD-10-CM | POA: Diagnosis not present

## 2021-11-01 DIAGNOSIS — Z8673 Personal history of transient ischemic attack (TIA), and cerebral infarction without residual deficits: Secondary | ICD-10-CM | POA: Insufficient documentation

## 2021-11-01 DIAGNOSIS — Z780 Asymptomatic menopausal state: Secondary | ICD-10-CM | POA: Diagnosis not present

## 2021-11-01 DIAGNOSIS — Z85828 Personal history of other malignant neoplasm of skin: Secondary | ICD-10-CM | POA: Insufficient documentation

## 2021-11-01 DIAGNOSIS — C50211 Malignant neoplasm of upper-inner quadrant of right female breast: Secondary | ICD-10-CM | POA: Diagnosis not present

## 2021-11-01 HISTORY — PX: AXILLARY LYMPH NODE BIOPSY: SHX5737

## 2021-11-01 HISTORY — PX: BREAST SURGERY: SHX581

## 2021-11-01 HISTORY — PX: BREAST LUMPECTOMY: SHX2

## 2021-11-01 HISTORY — PX: BREAST LUMPECTOMY WITH NEEDLE LOCALIZATION: SHX5759

## 2021-11-01 SURGERY — BREAST LUMPECTOMY WITH NEEDLE LOCALIZATION
Anesthesia: General | Site: Breast | Laterality: Right

## 2021-11-01 MED ORDER — EPHEDRINE SULFATE (PRESSORS) 50 MG/ML IJ SOLN
INTRAMUSCULAR | Status: DC | PRN
Start: 2021-11-01 — End: 2021-11-01
  Administered 2021-11-01 (×2): 10 mg via INTRAVENOUS

## 2021-11-01 MED ORDER — STERILE WATER FOR IRRIGATION IR SOLN
Status: DC | PRN
  Administered 2021-11-01: 1000 mL

## 2021-11-01 MED ORDER — BUPIVACAINE-EPINEPHRINE (PF) 0.5% -1:200000 IJ SOLN
INTRAMUSCULAR | Status: AC
  Filled 2021-11-01: qty 30

## 2021-11-01 MED ORDER — CHLORHEXIDINE GLUCONATE 0.12 % MT SOLN: 15.0000 mL | Freq: Once | OROMUCOSAL | Status: AC

## 2021-11-01 MED ORDER — METHYLENE BLUE 0.5 % INJ SOLN
INTRAVENOUS | Status: DC | PRN
Start: 2021-11-01 — End: 2021-11-01
  Administered 2021-11-01: 5 mL via SUBMUCOSAL

## 2021-11-01 MED ORDER — PROPOFOL 10 MG/ML IV BOLUS
INTRAVENOUS | Status: AC
  Filled 2021-11-01: qty 20

## 2021-11-01 MED ORDER — FENTANYL CITRATE (PF) 100 MCG/2ML IJ SOLN
INTRAMUSCULAR | Status: AC
  Filled 2021-11-01: qty 2

## 2021-11-01 MED ORDER — TRAMADOL HCL 50 MG PO TABS
ORAL_TABLET | ORAL | Status: AC
  Filled 2021-11-01: qty 1

## 2021-11-01 MED ORDER — METHYLENE BLUE 0.5 % INJ SOLN
INTRAVENOUS | Status: AC
  Filled 2021-11-01: qty 10

## 2021-11-01 MED ORDER — DEXAMETHASONE SODIUM PHOSPHATE 10 MG/ML IJ SOLN
INTRAMUSCULAR | Status: AC
  Filled 2021-11-01: qty 1

## 2021-11-01 MED ORDER — FENTANYL CITRATE (PF) 100 MCG/2ML IJ SOLN
25.0000 ug | INTRAMUSCULAR | Status: DC | PRN
  Administered 2021-11-01 (×2): 25 ug via INTRAVENOUS

## 2021-11-01 MED ORDER — CHLORHEXIDINE GLUCONATE 0.12 % MT SOLN
OROMUCOSAL | Status: AC
  Administered 2021-11-01: 15 mL via OROMUCOSAL
  Filled 2021-11-01: qty 15

## 2021-11-01 MED ORDER — TECHNETIUM TC 99M TILMANOCEPT KIT
1.0000 | PACK | Freq: Once | INTRAVENOUS | Status: AC | PRN
  Administered 2021-11-01: 1.1 via INTRADERMAL

## 2021-11-01 MED ORDER — FAMOTIDINE 20 MG PO TABS
ORAL_TABLET | ORAL | Status: AC
  Administered 2021-11-01: 20 mg via ORAL
  Filled 2021-11-01: qty 1

## 2021-11-01 MED ORDER — DEXAMETHASONE SODIUM PHOSPHATE 10 MG/ML IJ SOLN
INTRAMUSCULAR | Status: DC | PRN
  Administered 2021-11-01: 8 mg via INTRAVENOUS

## 2021-11-01 MED ORDER — LACTATED RINGERS IV SOLN: INTRAVENOUS | Status: DC

## 2021-11-01 MED ORDER — TRAMADOL HCL 50 MG PO TABS
50.0000 mg | ORAL_TABLET | Freq: Once | ORAL | Status: AC
  Administered 2021-11-01: 50 mg via ORAL

## 2021-11-01 MED ORDER — ACETAMINOPHEN 10 MG/ML IV SOLN
INTRAVENOUS | Status: AC
  Filled 2021-11-01: qty 100

## 2021-11-01 MED ORDER — FENTANYL CITRATE (PF) 100 MCG/2ML IJ SOLN
INTRAMUSCULAR | Status: DC | PRN
Start: 2021-11-01 — End: 2021-11-01
  Administered 2021-11-01 (×4): 25 ug via INTRAVENOUS

## 2021-11-01 MED ORDER — LIDOCAINE HCL (CARDIAC) PF 100 MG/5ML IV SOSY
PREFILLED_SYRINGE | INTRAVENOUS | Status: DC | PRN
Start: 2021-11-01 — End: 2021-11-01
  Administered 2021-11-01: 80 mg via INTRAVENOUS

## 2021-11-01 MED ORDER — CHLORHEXIDINE GLUCONATE CLOTH 2 % EX PADS: 6.0000 | MEDICATED_PAD | Freq: Once | CUTANEOUS | Status: DC

## 2021-11-01 MED ORDER — ONDANSETRON HCL 4 MG/2ML IJ SOLN: 4.0000 mg | Freq: Once | INTRAMUSCULAR | Status: DC | PRN

## 2021-11-01 MED ORDER — TRAMADOL HCL 50 MG PO TABS: 50.0000 mg | ORAL_TABLET | Freq: Four times a day (QID) | ORAL | 0 refills | Status: DC | PRN

## 2021-11-01 MED ORDER — ORAL CARE MOUTH RINSE: 15.0000 mL | Freq: Once | OROMUCOSAL | Status: AC

## 2021-11-01 MED ORDER — GLYCOPYRROLATE 0.2 MG/ML IJ SOLN
INTRAMUSCULAR | Status: AC
  Filled 2021-11-01: qty 1

## 2021-11-01 MED ORDER — ONDANSETRON HCL 4 MG/2ML IJ SOLN
INTRAMUSCULAR | Status: AC
  Filled 2021-11-01: qty 2

## 2021-11-01 MED ORDER — BUPIVACAINE-EPINEPHRINE (PF) 0.5% -1:200000 IJ SOLN
INTRAMUSCULAR | Status: DC | PRN
Start: 2021-11-01 — End: 2021-11-01
  Administered 2021-11-01: 30 mL

## 2021-11-01 MED ORDER — FAMOTIDINE 20 MG PO TABS: 20.0000 mg | ORAL_TABLET | Freq: Once | ORAL | Status: AC

## 2021-11-01 MED ORDER — ONDANSETRON HCL 4 MG/2ML IJ SOLN
INTRAMUSCULAR | Status: DC | PRN
  Administered 2021-11-01: 4 mg via INTRAVENOUS

## 2021-11-01 MED ORDER — ACETAMINOPHEN 10 MG/ML IV SOLN
INTRAVENOUS | Status: DC | PRN
Start: 2021-11-01 — End: 2021-11-01
  Administered 2021-11-01: 1000 mg via INTRAVENOUS

## 2021-11-01 MED ORDER — PROPOFOL 10 MG/ML IV BOLUS
INTRAVENOUS | Status: DC | PRN
  Administered 2021-11-01: 140 mg via INTRAVENOUS
  Administered 2021-11-01 (×2): 30 mg via INTRAVENOUS

## 2021-11-01 SURGICAL SUPPLY — 71 items
APL PRP STRL LF DISP 70% ISPRP (MISCELLANEOUS) ×2
APPLIER CLIP 11 MED OPEN (CLIP)
APR CLP MED 11 20 MLT OPN (CLIP)
BINDER BREAST LRG (GAUZE/BANDAGES/DRESSINGS) ×1 IMPLANT
BLADE BOVIE TIP EXT 4 (BLADE) IMPLANT
BLADE SURG 15 STRL SS SAFETY (BLADE) ×8 IMPLANT
BULB RESERV EVAC DRAIN JP 100C (MISCELLANEOUS) IMPLANT
CHLORAPREP W/TINT 26 (MISCELLANEOUS) ×4 IMPLANT
CLIP APPLIE 11 MED OPEN (CLIP) IMPLANT
CNTNR SPEC 2.5X3XGRAD LEK (MISCELLANEOUS)
CONT SPEC 4OZ STER OR WHT (MISCELLANEOUS)
CONT SPEC 4OZ STRL OR WHT (MISCELLANEOUS)
CONTAINER SPEC 2.5X3XGRAD LEK (MISCELLANEOUS) IMPLANT
COVER PROBE FLX POLY STRL (MISCELLANEOUS) ×4 IMPLANT
DEVICE DUBIN SPECIMEN MAMMOGRA (MISCELLANEOUS) ×4 IMPLANT
DRAIN CHANNEL JP 15F RND 16 (MISCELLANEOUS) IMPLANT
DRAPE LAPAROTOMY TRNSV 106X77 (MISCELLANEOUS) ×4 IMPLANT
DRSG GAUZE FLUFF 36X18 (GAUZE/BANDAGES/DRESSINGS) ×8 IMPLANT
DRSG TEGADERM 4X4.75 (GAUZE/BANDAGES/DRESSINGS) ×4 IMPLANT
DRSG TELFA 3X8 NADH (GAUZE/BANDAGES/DRESSINGS) ×3 IMPLANT
DRSG TELFA 4X3 1S NADH ST (GAUZE/BANDAGES/DRESSINGS) ×4 IMPLANT
ELECT CAUTERY BLADE TIP 2.5 (TIP) ×3
ELECT REM PT RETURN 9FT ADLT (ELECTROSURGICAL) ×3
ELECTRODE CAUTERY BLDE TIP 2.5 (TIP) ×3 IMPLANT
ELECTRODE REM PT RTRN 9FT ADLT (ELECTROSURGICAL) ×3 IMPLANT
GAUZE 4X4 16PLY ~~LOC~~+RFID DBL (SPONGE) ×4 IMPLANT
GLOVE SURG ENC MOIS LTX SZ7.5 (GLOVE) ×4 IMPLANT
GLOVE SURG UNDER LTX SZ8 (GLOVE) ×4 IMPLANT
GOWN STRL REUS W/ TWL LRG LVL3 (GOWN DISPOSABLE) ×6 IMPLANT
GOWN STRL REUS W/TWL LRG LVL3 (GOWN DISPOSABLE) ×6
KIT TURNOVER KIT A (KITS) ×4 IMPLANT
LABEL OR SOLS (LABEL) ×4 IMPLANT
MANIFOLD NEPTUNE II (INSTRUMENTS) ×4 IMPLANT
MARGIN MAP 10MM (MISCELLANEOUS) ×4 IMPLANT
NDL FILTER BLUNT 18X1 1/2 (NEEDLE) IMPLANT
NDL HYPO 25X1 1.5 SAFETY (NEEDLE) ×6 IMPLANT
NDL SPNL 20GX3.5 QUINCKE YW (NEEDLE) IMPLANT
NEEDLE FILTER BLUNT 18X 1/2SAF (NEEDLE)
NEEDLE FILTER BLUNT 18X1 1/2 (NEEDLE) IMPLANT
NEEDLE HYPO 22GX1.5 SAFETY (NEEDLE) ×4 IMPLANT
NEEDLE HYPO 25X1 1.5 SAFETY (NEEDLE) ×6 IMPLANT
NEEDLE SPNL 20GX3.5 QUINCKE YW (NEEDLE) IMPLANT
NS IRRIG 500ML POUR BTL (IV SOLUTION) ×4 IMPLANT
PACK BASIN MINOR ARMC (MISCELLANEOUS) ×4 IMPLANT
PAD DRESSING TELFA 3X8 NADH (GAUZE/BANDAGES/DRESSINGS) ×3 IMPLANT
PENCIL ELECTRO HAND CTR (MISCELLANEOUS) ×4 IMPLANT
RETRACTOR RING XSMALL (MISCELLANEOUS) IMPLANT
RTRCTR WOUND ALEXIS 13CM XS SH (MISCELLANEOUS) ×3
SHEARS FOC LG CVD HARMONIC 17C (MISCELLANEOUS) IMPLANT
SHEARS HARMONIC 9CM CVD (BLADE) IMPLANT
SLEVE PROBE SENORX GAMMA FIND (MISCELLANEOUS) ×1 IMPLANT
STRIP CLOSURE SKIN 1/2X4 (GAUZE/BANDAGES/DRESSINGS) ×4 IMPLANT
SUT ETHILON 3-0 FS-10 30 BLK (SUTURE) ×3
SUT SILK 2 0 (SUTURE) ×3
SUT SILK 2-0 18XBRD TIE 12 (SUTURE) ×3 IMPLANT
SUT VIC AB 2-0 CT1 27 (SUTURE) ×12
SUT VIC AB 2-0 CT1 TAPERPNT 27 (SUTURE) ×6 IMPLANT
SUT VIC AB 3-0 54X BRD REEL (SUTURE) ×3 IMPLANT
SUT VIC AB 3-0 BRD 54 (SUTURE) ×3
SUT VIC AB 3-0 SH 27 (SUTURE) ×6
SUT VIC AB 3-0 SH 27X BRD (SUTURE) ×6 IMPLANT
SUT VIC AB 4-0 FS2 27 (SUTURE) ×8 IMPLANT
SUT VIC AB 4-0 PS2 18 (SUTURE) ×4 IMPLANT
SUTURE EHLN 3-0 FS-10 30 BLK (SUTURE) ×3 IMPLANT
SWABSTK COMLB BENZOIN TINCTURE (MISCELLANEOUS) ×4 IMPLANT
SYR 10ML LL (SYRINGE) ×4 IMPLANT
SYR BULB IRRIG 60ML STRL (SYRINGE) ×4 IMPLANT
TAPE TRANSPORE STRL 2 31045 (GAUZE/BANDAGES/DRESSINGS) ×4 IMPLANT
TRAP NEPTUNE SPECIMEN COLLECT (MISCELLANEOUS) ×4 IMPLANT
WATER STERILE IRR 1000ML POUR (IV SOLUTION) ×4 IMPLANT
WATER STERILE IRR 500ML POUR (IV SOLUTION) ×4 IMPLANT

## 2021-11-01 NOTE — Anesthesia Procedure Notes (Signed)
Procedure Name: LMA Insertion ?Date/Time: 11/01/2021 10:49 AM ?Performed by: Aline Brochure, CRNA ?Pre-anesthesia Checklist: Patient identified, Patient being monitored, Timeout performed, Emergency Drugs available and Suction available ?Patient Re-evaluated:Patient Re-evaluated prior to induction ?Oxygen Delivery Method: Circle system utilized ?Preoxygenation: Pre-oxygenation with 100% oxygen ?Induction Type: IV induction ?Ventilation: Mask ventilation without difficulty ?LMA: LMA inserted ?LMA Size: 3.5 ?Tube type: Oral ?Number of attempts: 1 ?Placement Confirmation: positive ETCO2 and breath sounds checked- equal and bilateral ?Tube secured with: Tape ?Dental Injury: Teeth and Oropharynx as per pre-operative assessment  ? ? ? ? ?

## 2021-11-01 NOTE — H&P (Signed)
Kristin Wise ?[id number] ?December 22, 1940 ? ?  ? ?HPI: Patient with recently identified right breast cancer. Desires breast conservation.  Tolerated SLN and wire localization well.  ? ?Medications Prior to Admission  ?Medication Sig Dispense Refill Last Dose  ? Alpha-Lipoic Acid 600 MG TABS Take 600 mg by mouth daily.   10/24/2021  ? aspirin 81 MG EC tablet Take 81 mg by mouth daily.   10/30/2021  ? Biotin 5 MG TBDP Take 5 mg by mouth daily.   10/24/2021  ? CALCIUM-VITAMIN D PO Take 1 tablet by mouth daily.   10/24/2021  ? ibuprofen (ADVIL,MOTRIN) 200 MG tablet Take 400 mg by mouth every 8 (eight) hours as needed for moderate pain.   10/24/2021  ? L-Lysine 500 MG TABS Take 1,000 mg by mouth 4 (four) times daily as needed (mouth sores).   10/24/2021  ? losartan-hydrochlorothiazide (HYZAAR) 50-12.5 MG tablet Take 0.5 tablets by mouth every morning.   10/31/2021  ? Lutein 10 MG TABS Take 10 mg by mouth daily.   10/24/2021  ? rosuvastatin (CRESTOR) 5 MG tablet Take 5 mg by mouth every other day. MORNING   10/31/2021  ? Vitamin D, Ergocalciferol, (DRISDOL) 1.25 MG (50000 UNIT) CAPS capsule Take 50,000 Units by mouth every Sunday.   10/24/2021  ? COVID-19 mRNA Vac-TriS, Pfizer, SUSP injection USE AS DIRECTED .3 mL 0   ? ?Allergies  ?Allergen Reactions  ? Mold Extract [Trichophyton] Other (See Comments)  ?  Allergy to it.   ? Codeine   ?  "Feels bad"  ? Indomethacin   ?  "Bad dreams"  ? Other   ?  Sodium Laurel Sulfate- caused gums to peel, corners of mouth became sore  ? Sulfa Antibiotics Hives  ?  Feels terrible   ? Cashew Nut (Anacardium Occidentale) Skin Test Rash  ? Nickel Rash  ? Sulfamethoxazole-Trimethoprim Rash  ? ?Past Medical History:  ?Diagnosis Date  ? Arthritis   ? Breast cancer ([place])   ? Bronchitis   ? Cancer [facility])   ? basal cell  ? Change in bowel habits   ? Colon polyps   ? Femur fracture, left ([place])   ? Hepatitis   ? as a child  ? Hyperlipidemia   ? Hypertension   ? controlled with medication;   ? Hypothyroidism   ?  h/o  ? MGUS (monoclonal gammopathy of unknown significance)   ? Osteoporosis   ? hasn't been checked in a while; unsure how severe;   ? Postmenopausal   ? Rectal bleeding   ? Seasonal allergies   ? Sleep apnea   ? does not use cpap  ? Stroke [facility]) ??  ? pt unaware but was told she had stroke possibly after fall in 2016-has occ balance issues  ? Vitamin D deficiency   ? ?Past Surgical History:  ?Procedure Laterality Date  ? BREAST BIOPSY Left 09/30/2008  ? Patient presented with acute swelling in the breast, aspiration culture negative.  Subsequent core biopsy showing fragments of cyst wall with granulation tissue, fibrosis and focal residual epithelial lining.  No malignancy.  ? BREAST BIOPSY Right 09/22/2021  ? ULTRASOUND-GUIDED BIOPSY: - INVASIVE MAMMARY CARCINOMA WITH LOBULAR FEATURES  ? BREAST CYST ASPIRATION    ? BREAST SURGERY Right 11/01/2021  ? wire loc w/ sn for lumpectomy  ? CATARACT EXTRACTION W/PHACO Left 03/02/2021  ? Procedure: CATARACT EXTRACTION PHACO AND INTRAOCULAR LENS PLACEMENT ([facility]) LEFT 6.40 00:35.2;  Surgeon: Birder Robson, MD;  Location: [place];  Service: Ophthalmology;  Laterality: Left;  ? CATARACT EXTRACTION W/PHACO Right 03/16/2021  ? Procedure: CATARACT EXTRACTION PHACO AND INTRAOCULAR LENS PLACEMENT ([place]) RIGHT;  Surgeon: Birder Robson, MD;  Location: [place];  Service: Ophthalmology;  Laterality: Right;  7.42 ?0:51.0  ? COLONOSCOPY WITH PROPOFOL N/A 02/05/2015  ? Procedure: COLONOSCOPY WITH PROPOFOL;  Surgeon: Manya Silvas, MD;  Location: [facility] ENDOSCOPY;  Service: Endoscopy;  Laterality: N/A;  ? COLONOSCOPY WITH PROPOFOL N/A 04/08/2020  ? Procedure: COLONOSCOPY WITH PROPOFOL;  Surgeon: Toledo, Benay Pike, MD;  Location: [facility] ENDOSCOPY;  Service: Gastroenterology;  Laterality: N/A;  ? FEMUR FRACTURE SURGERY Left 04/18/2015  ? FRACTURE SURGERY    ? TONSILLECTOMY    ? ?Social History  ? ?Socioeconomic History  ? Marital status: Married  ?  Spouse name:  Not on file  ? Number of children: Not on file  ? Years of education: Not on file  ? Highest education level: Not on file  ?Occupational History  ? Not on file  ?Tobacco Use  ? Smoking status: Never  ? Smokeless tobacco: Never  ?Vaping Use  ? Vaping Use: Never used  ?Substance and Sexual Activity  ? Alcohol use: No  ? Drug use: No  ? Sexual activity: Not on file  ?Other Topics Concern  ? Not on file  ?Social History Narrative  ? Not on file  ? ?Social Determinants of Health  ? ?Financial Resource Strain: Not on file  ?Food Insecurity: Not on file  ?Transportation Needs: Not on file  ?Physical Activity: Not on file  ?Stress: Not on file  ?Social Connections: Not on file  ?Intimate Partner Violence: Not on file  ? ?Social History  ? ?Social History Narrative  ? Not on file  ? ? ? ?ROS: Negative.  ? ? ? ?PE: ?HEENT: Negative. ?Lungs: Clear. ?Cardio: RR. ? ? ?Assessment/Plan: ? ?Proceed with planned right breast wide excision and SLN biopsy.   ? ? ?Kristin Wise Kristin Wise ?11/01/2021 ? ?  ?

## 2021-11-01 NOTE — Op Note (Signed)
Preoperative diagnosis: Right breast cancer, desire for breast conservation. ? ?Postoperative diagnosis: Same. ? ?Operative procedure 1) wide local excision with wire and ultrasound guidance; 2) sentinel node biopsy x4. ? ?Operating surgeon: Hervey Ard, MD. ? ?Anesthesia: General by LMA, Marcaine 0.5% with 1: 200,000 units of epinephrine, 30 cc. ? ?Estimated blood loss: Less than 5 cc. ? ?Clinical note: This 81 year old woman recently noticed an abnormal mammogram and underwent biopsy showing evidence of invasive mammary carcinoma.  Reviewing options for management she desired breast conservation.  I reviewed with her the recommendations that in the age 82+ sentinel node biopsy can be omitted for ER positive tumors but she desired sentinel node biopsy.  She was injected with technetium sulfur colloid prior to the procedure.  Wire localization was completed by the radiology service. ? ?Operative note: With the patient under adequate general anesthesia alcohol was applied to the periareolar skin and 5 cc of 0.5% methylene blue was injected in the subareolar plexus.  The breast, chest and axilla was cleansed with ChloraPrep and draped. ? ?Ultrasound was used to identify the thickened portion of the wire and previously placed clip.  Image placed in the chart for permanent record.  Local anesthesia was infiltrated in the upper outer quadrant.  The area of increased signal with the node seeker device was just about 3 cm cephalad to the planned surgical incision and a single incision was utilized.  A curvilinear incision was carried down to the skin and subcutaneous tissue.  The deep dissection completed with electrocautery.  An extra small Alexis wound protector was placed and a block of tissue approximately 2-1/2 x 2-1/2 x 3 cm was excised orientated and specimen radiograph showed the intact wire tip as well as the previously placed biopsy clip. ? ?While the pathologist reviewed the specimen attention was turned to  the axilla.  The axillary envelope was opened and 2 hot blue nodes were noted followed by 2 hot 9 blue nodes.  Hemostasis was electrocautery. ? ?The pathologist reported that while the hematoma cavity was at the superior aspect of the specimen the anterior margin appeared about the tumor mass in the inferior margin was closed.  With this in mind the axillary envelope was closed with interrupted 2-0 Vicryl figure-of-eight sutures.  An additional strip of tissue approximately 2 and half centimeters in diameter and 5 cm in length which encompassed the new anterior and inferior margin was obtained.  This was essentially into the subcutaneous fat for the anterior portion.  The new margin was placed on the Telfa pad and the specimen sutured circumferentially and hand-delivered at the end of the procedure for orientation with the pathology PA.  The breast tissue was then reapproximated with interrupted 2-0 Vicryl figure-of-eight sutures.  It was necessary to free the skin on the superior aspect by mobilizing the skin and subcutaneous fat with cautery to allow smooth approximation.  Multiple layers of sutures were utilized.  The subcutaneous fat which again was fairly thin was approximated with interrupted sutures.  The skin was closed with a running 4-0 Vicryl subcuticular suture.  Benzoin and Steri-Strips followed by Telfa and Tegaderm dressing were applied. ? ?The patient tolerated procedure well and was taken to the PACU in stable condition. ?

## 2021-11-01 NOTE — Anesthesia Preprocedure Evaluation (Signed)
Anesthesia Evaluation  ?Patient identified by MRN, date of birth, ID band ?Patient awake ? ? ? ?Reviewed: ?Allergy & Precautions, NPO status , Patient's Chart, lab work & pertinent test results ? ?History of Anesthesia Complications ?Negative for: history of anesthetic complications ? ?Airway ?Mallampati: II ? ?TM Distance: >3 FB ?Neck ROM: Full ? ? ? Dental ? ?(+) Implants, Dental Advidsory Given, Teeth Intact ?  ?Pulmonary ?neg shortness of breath, sleep apnea (does not tolerate wearing CPAP) , neg COPD, neg recent URI,  ?  ?breath sounds clear to auscultation- rhonchi ?(-) wheezing ? ? ? ? ? Cardiovascular ?Exercise Tolerance: Good ?hypertension, Pt. on medications ?(-) angina(-) CAD, (-) Past MI, (-) Cardiac Stents and (-) CABG (-) dysrhythmias (-) Valvular Problems/Murmurs ?Rhythm:Regular Rate:Normal ?- Systolic murmurs and - Diastolic murmurs ? ?  ?Neuro/Psych ?CVA (balance problems), Residual Symptoms   ? GI/Hepatic ?negative GI ROS, Neg liver ROS,   ?Endo/Other  ?negative endocrine ROSneg diabetes ? Renal/GU ?negative Renal ROS  ? ?  ?Musculoskeletal ?negative musculoskeletal ROS ?(+)  ? Abdominal ?(+) + obese,   ?Peds ? Hematology ?negative hematology ROS ?(+)   ?Anesthesia Other Findings ?Past Medical History: ?No date: Arthritis ?No date: Breast cancer ([place]) ?No date: Bronchitis ?No date: Cancer [facility]) ?    Comment:  basal cell ?No date: Change in bowel habits ?No date: Colon polyps ?No date: Femur fracture, left ([place]) ?No date: Hepatitis ?    Comment:  as a child ?No date: Hyperlipidemia ?No date: Hypertension ?    Comment:  controlled with medication;  ?No date: Hypothyroidism ?    Comment:  h/o ?No date: MGUS (monoclonal gammopathy of unknown significance) ?No date: Osteoporosis ?    Comment:  hasn't been checked in a while; unsure how severe;  ?No date: Postmenopausal ?No date: Rectal bleeding ?No date: Seasonal allergies ?No date: Sleep apnea ?    Comment:  does not  use cpap ???: Stroke [facility]) ?    Comment:  pt unaware but was told she had stroke possibly after  ?             fall in 2016-has occ balance issues ?No date: Vitamin D deficiency ? ? Reproductive/Obstetrics ? ?  ? ? ? ? ? ? ? ? ? ? ? ? ? ?  ?  ? ? ? ? ? ? ? ? ?Anesthesia Physical ? ?Anesthesia Plan ? ?ASA: 3 ? ?Anesthesia Plan: General  ? ?Post-op Pain Management:   ? ?Induction: Intravenous ? ?PONV Risk Score and Plan: 2 and Dexamethasone, Treatment may vary due to age or medical condition and Ondansetron ? ?Airway Management Planned: LMA ? ?Additional Equipment:  ? ?Intra-op Plan:  ? ?Post-operative Plan: Extubation in OR ? ?Informed Consent: I have reviewed the patients History and Physical, chart, labs and discussed the procedure including the risks, benefits and alternatives for the proposed anesthesia with the patient or authorized representative who has indicated his/her understanding and acceptance.  ? ? ? ?Dental advisory given ? ?Plan Discussed with: CRNA and Anesthesiologist ? ?Anesthesia Plan Comments:   ? ? ? ? ? ? ?Anesthesia Quick Evaluation ? ?

## 2021-11-01 NOTE — Transfer of Care (Signed)
Immediate Anesthesia Transfer of Care Note ? ?Patient: Kristin Wise ? ?Procedure(s) Performed: BREAST LUMPECTOMY WITH NEEDLE LOCALIZATION (Right: Breast) ?AXILLARY LYMPH NODE BIOPSY (Right: Axilla) ? ?Patient Location: PACU ? ?Anesthesia Type:General ? ?Level of Consciousness: awake, alert  and oriented ? ?Airway & Oxygen Therapy: Patient Spontanous Breathing and Patient connected to face mask oxygen ? ?Post-op Assessment: Report given to RN and Post -op Vital signs reviewed and stable ? ?Post vital signs: Reviewed and stable ? ?Last Vitals:  ?Vitals Value Taken Time  ?BP 138/61 11/01/21 1231  ?Temp    ?Pulse 87 11/01/21 1232  ?Resp 15 11/01/21 1232  ?SpO2 97 % 11/01/21 1232  ?Vitals shown include unvalidated device data. ? ?Last Pain:  ?Vitals:  ? 11/01/21 0924  ?TempSrc: Temporal  ?PainSc: 0-No pain  ?   ? ?  ? ?Complications: No notable events documented. ?

## 2021-11-01 NOTE — Discharge Instructions (Signed)

## 2021-11-01 NOTE — Anesthesia Postprocedure Evaluation (Signed)
Anesthesia Post Note ? ?Patient: Kristin Wise ? ?Procedure(s) Performed: BREAST LUMPECTOMY WITH NEEDLE LOCALIZATION (Right: Breast) ?AXILLARY LYMPH NODE BIOPSY (Right: Axilla) ? ?Patient location during evaluation: PACU ?Anesthesia Type: General ?Level of consciousness: awake and alert ?Pain management: pain level controlled ?Vital Signs Assessment: post-procedure vital signs reviewed and stable ?Respiratory status: spontaneous breathing, nonlabored ventilation, respiratory function stable and patient connected to nasal cannula oxygen ?Cardiovascular status: blood pressure returned to baseline and stable ?Postop Assessment: no apparent nausea or vomiting ?Anesthetic complications: no ? ? ?No notable events documented. ? ? ?Last Vitals:  ?Vitals:  ? 11/01/21 1300 11/01/21 1321  ?BP: 128/62 118/89  ?Pulse: 72 74  ?Resp: 16 18  ?Temp: 36.5 ?C (!) 36.1 ?C  ?SpO2: 97% 98%  ?  ?Last Pain:  ?Vitals:  ? 11/01/21 1321  ?TempSrc: Temporal  ?PainSc: 3   ? ? ?  ?  ?  ?  ?  ?  ? ?Martha Clan ? ? ? ? ?

## 2021-11-02 ENCOUNTER — Encounter: Payer: Self-pay | Admitting: General Surgery

## 2021-11-03 ENCOUNTER — Ambulatory Visit: Payer: Medicare HMO | Admitting: Internal Medicine

## 2021-11-04 ENCOUNTER — Other Ambulatory Visit: Payer: Self-pay | Admitting: Anatomic Pathology & Clinical Pathology

## 2021-11-04 LAB — SURGICAL PATHOLOGY

## 2021-11-04 NOTE — Progress Notes (Signed)
mdt  

## 2021-11-10 ENCOUNTER — Encounter: Payer: Self-pay | Admitting: Internal Medicine

## 2021-11-10 ENCOUNTER — Other Ambulatory Visit: Payer: Self-pay

## 2021-11-10 ENCOUNTER — Inpatient Hospital Stay: Payer: Medicare HMO | Attending: Internal Medicine | Admitting: Internal Medicine

## 2021-11-10 DIAGNOSIS — Z840 Family history of diseases of the skin and subcutaneous tissue: Secondary | ICD-10-CM | POA: Insufficient documentation

## 2021-11-10 DIAGNOSIS — C50411 Malignant neoplasm of upper-outer quadrant of right female breast: Secondary | ICD-10-CM | POA: Diagnosis not present

## 2021-11-10 DIAGNOSIS — Z8042 Family history of malignant neoplasm of prostate: Secondary | ICD-10-CM | POA: Insufficient documentation

## 2021-11-10 DIAGNOSIS — D472 Monoclonal gammopathy: Secondary | ICD-10-CM | POA: Insufficient documentation

## 2021-11-10 DIAGNOSIS — Z803 Family history of malignant neoplasm of breast: Secondary | ICD-10-CM | POA: Insufficient documentation

## 2021-11-10 DIAGNOSIS — Z8051 Family history of malignant neoplasm of kidney: Secondary | ICD-10-CM | POA: Diagnosis not present

## 2021-11-10 DIAGNOSIS — Z806 Family history of leukemia: Secondary | ICD-10-CM | POA: Insufficient documentation

## 2021-11-10 DIAGNOSIS — Z170 Estrogen receptor positive status [ER+]: Secondary | ICD-10-CM | POA: Diagnosis not present

## 2021-11-10 DIAGNOSIS — Z800 Family history of malignant neoplasm of digestive organs: Secondary | ICD-10-CM | POA: Insufficient documentation

## 2021-11-10 NOTE — Assessment & Plan Note (Addendum)
#   RIGHT INVASIVE LOBULAR CANCER- ER-POSITIVE/PR-NEG; her 2-NEGATIVE;  G-1.[s/p lumpectomy; margins 3 mm].  Given T1b; grade 1-do not recommend Oncotype.  Given the low risk of recurrence-not recommend chemotherapy. ? ?# I discussed the role of adjuvant radiation therapy and also adjuvant antihormone therapy.  Will refer to to Dr. Donella Stade. ? ?# Discussed the mechanism of action of aromatase inhibitors-with blocking of estrogen to prevent breast cancer.  Also discussed the potential side effects including but not limited to arthralgias hot flashes and increased risk of osteoporosis.  Patient is very anxious/reluctant with any endocrine therapy.  Concerned about the potential side effects.  We will keep discussing with the patient. ? ?# Patient has a personal history of osteoporosis [2022-BMD; [place]]; discussed regarding use of tamoxifen [hx of stroke].  Recommend with the AI/tamoxifen.  ? ?# IgA lambda MGUS [since 2016]- OCT 2022- 1.1- kappa/lamda-N [[facility]]; CBC/CMP-N.  Monitor for now. ? ?# DISPOSITION:  ?# print pathology report ?# referral to Dr.Chrystal re: breast cancer ?# follow-up in 2 month-MD; no labs-Dr.B ? ? ? ?Cc; Dr.Hedrick-  ?

## 2021-11-10 NOTE — Progress Notes (Signed)
[place] ?OFFICE PROGRESS NOTE ? ?Patient Care Team: ?[place] Pink, [name] ? ? ?SUMMARY OF ONCOLOGIC HISTORY: Breast cancer ? ? ? ?Oncology History Overview Note  ?# JAN 2023- # RIGHT breast [place] with lobular feathureds ER-POSITIVE/PR-NEG; her 2-2+IHC; FISH-PENIDNG; G-1. JAN 2023- MRI-MRI shows 1.8 cm right upper upper quadrant mass [s/p recent biopsy; Dr.Byrnett].DIAGNOSIS:  ?A. BREAST, RIGHT UPPER OUTER QUADRANT; EXCISION:  ?- INVASIVE LOBULAR CARCINOMA.  ?- CLIP AND BIOPSY SITE IDENTIFIED.  ?- SEE CANCER SUMMARY.  ? ?Comment:  ?Multiple additional deeper recut levels were examined, due to disruption  ?of tissue in the initial slides demonstrating tumor. Invasive carcinoma  ?measures up to 9 mm in greatest linear extent, although there is dense  ?fibrosis surrounding the invasive carcinoma, which may account for the  ?discrepancy between the microscopic measurement, and the radiographic  ?and gross measurements.  ? ?B. LYMPH NODES, RIGHT SENTINEL #1, #2, #3, AND #4; EXCISION:  ?- FOUR LYMPH NODES, NEGATIVE FOR MALIGNANCY (0/4).  ? ?Comment:  ?Immunohistochemical studies directed against cytokeratin AE1/AE3 are  ?performed on all 4 tissue blocks, and are negative for metastatic  ?carcinoma  ? ?C. BREAST, RIGHT, ADDITIONAL ANTERIOR/INFERIOR MARGIN; EXCISION:  ?- BENIGN MAMMARY PARENCHYMA WITH FIBROCYSTIC AND APOCRINE CHANGES,  ?COLUMNAR CELL CHANGE WITHOUT ATYPIA, USUAL DUCTAL HYPERPLASIA, AND FOCAL  ?SCLEROSING ADENOSIS.  ?- FOCAL CHANGES SUGGESTIVE OF BENIGN INTRADUCTAL PAPILLOMA.  ?- NEGATIVE FOR RESIDUAL INVASIVE LOBULAR CARCINOMA AND OTHER ATYPICAL  ?PROLIFERATIVE BREAST DISEASE.  ? ?CANCER CASE SUMMARY: INVASIVE CARCINOMA OF THE BREAST  ?Standard(s): AJCC-UICC 8  ? ?SPECIMEN  ?Procedure: Excision  ?Specimen Laterality: Right  ? ?TUMOR  ?Histologic Type: Invasive lobular carcinoma  ?Histologic Grade (Nottingham Histologic Score)  ?                     Glandular  (Acinar)/Tubular Differentiation: 3  ?                     Nuclear Pleomorphism: 1  ?                     Mitotic Rate: 1  ?                     Overall Grade: Grade 1  ?Tumor Size: 9 mm  ?Tumor Focality: Single focus of invasive carcinoma  ?Tumor Extent: Not applicable  ?Ductal Carcinoma In Situ (DCIS): Not identified  ?Lymphatic and/or Vascular Invasion: Not identified  ?Treatment Effect in the Breast: No known presurgical therapy  ? ?MARGINS  ?Margin Status for Invasive Carcinoma: All margins negative for invasive  ?carcinoma  ?                     Distance from closest margin: 3 mm  ?                     Specify closest margin: Posterior/deep  ? ?Margin Status for DCIS: Not applicable (no DCIS in specimen)  ? ? ? July 2016-M-protein-IgA Lamda- MGUS: NOV 2016- M spike- 1gm/dl; IgA- 1898 [total]; K/L=N; AUG 2016- Skeletal survey-Normal; ? ?# Stroke- ? [Dr.Shah; 2018] ?  ?Carcinoma of upper-outer quadrant of right breast in female, estrogen receptor positive ([place])  ?10/01/2021 Initial Diagnosis  ? Carcinoma of upper-outer quadrant of right breast in female, estrogen receptor positive ([place]) ?  ?11/10/2021 Cancer Staging  ? Staging form: Breast, AJCC 8th Edition ?-  Clinical: Stage IA (cT1b, cN0, cM0, G1, ER+, PR-, HER2-) - Signed by Cammie Sickle, MD on 11/10/2021 ?Histologic grading system: 3 grade system ? ?  ? ? ?INTERVAL HISTORY: Alone.  Ambulating independently. ? ?81 year old female patient with diagnosis of stage I breast cancer ER positive PR negative HER2 negative is here to review the results of her postlumpectomy pathology/treatment plan. ? ?Patient states to be healing well from surgery.  However complains of mild discomfort at site of lymph node excision. ? ?Review of Systems  ?Constitutional:  Negative for chills, diaphoresis, fever, malaise/fatigue and weight loss.  ?HENT:  Negative for nosebleeds and sore throat.   ?Eyes:  Negative for double vision.  ?Respiratory:  Negative for cough, hemoptysis,  sputum production, shortness of breath and wheezing.   ?Cardiovascular:  Negative for chest pain, palpitations, orthopnea and leg swelling.  ?Gastrointestinal:  Negative for abdominal pain, blood in stool, constipation, diarrhea, heartburn, melena, nausea and vomiting.  ?Genitourinary:  Negative for dysuria, frequency and urgency.  ?Musculoskeletal:  Positive for back pain and joint pain.  ?Skin: Negative.  Negative for itching and rash.  ?Neurological:  Negative for dizziness, tingling, focal weakness, weakness and headaches.  ?Endo/Heme/Allergies:  Does not bruise/bleed easily.  ?Psychiatric/Behavioral:  Negative for depression. The patient is not nervous/anxious and does not have insomnia.   ? ? ?PAST MEDICAL HISTORY :  ?Past Medical History:  ?Diagnosis Date  ? Arthritis   ? Breast cancer ([place])   ? Bronchitis   ? Cancer [facility])   ? basal cell  ? Change in bowel habits   ? Colon polyps   ? Femur fracture, left ([place])   ? Hepatitis   ? as a child  ? Hyperlipidemia   ? Hypertension   ? controlled with medication;   ? Hypothyroidism   ? h/o  ? MGUS (monoclonal gammopathy of unknown significance)   ? Osteoporosis   ? hasn't been checked in a while; unsure how severe;   ? Postmenopausal   ? Rectal bleeding   ? Seasonal allergies   ? Sleep apnea   ? does not use cpap  ? Stroke [facility]) ??  ? pt unaware but was told she had stroke possibly after fall in 2016-has occ balance issues  ? Vitamin D deficiency   ? ? ?PAST SURGICAL HISTORY :   ?Past Surgical History:  ?Procedure Laterality Date  ? AXILLARY LYMPH NODE BIOPSY Right 11/01/2021  ? Procedure: AXILLARY LYMPH NODE BIOPSY;  Surgeon: Robert Bellow, MD;  Location: [facility] ORS;  Service: General;  Laterality: Right;  ? BREAST BIOPSY Left 09/30/2008  ? Patient presented with acute swelling in the breast, aspiration culture negative.  Subsequent core biopsy showing fragments of cyst wall with granulation tissue, fibrosis and focal residual epithelial lining.  No malignancy.  ?  BREAST BIOPSY Right 09/22/2021  ? ULTRASOUND-GUIDED BIOPSY: - INVASIVE MAMMARY CARCINOMA WITH LOBULAR FEATURES  ? BREAST CYST ASPIRATION    ? BREAST LUMPECTOMY WITH NEEDLE LOCALIZATION Right 11/01/2021  ? Procedure: BREAST LUMPECTOMY WITH NEEDLE LOCALIZATION;  Surgeon: Robert Bellow, MD;  Location: [facility] ORS;  Service: General;  Laterality: Right;  ? BREAST SURGERY Right 11/01/2021  ? wire loc w/ sn for lumpectomy  ? CATARACT EXTRACTION W/PHACO Left 03/02/2021  ? Procedure: CATARACT EXTRACTION PHACO AND INTRAOCULAR LENS PLACEMENT (IOC) LEFT 6.40 00:35.2;  Surgeon: Birder Robson, MD;  Location: [place];  Service: Ophthalmology;  Laterality: Left;  ? CATARACT EXTRACTION W/PHACO Right 03/16/2021  ? Procedure: CATARACT EXTRACTION  PHACO AND INTRAOCULAR LENS PLACEMENT (IOC) RIGHT;  Surgeon: Birder Robson, MD;  Location: [place];  Service: Ophthalmology;  Laterality: Right;  7.42 ?0:51.0  ? COLONOSCOPY WITH PROPOFOL N/A 02/05/2015  ? Procedure: COLONOSCOPY WITH PROPOFOL;  Surgeon: Manya Silvas, MD;  Location: [facility] ENDOSCOPY;  Service: Endoscopy;  Laterality: N/A;  ? COLONOSCOPY WITH PROPOFOL N/A 04/08/2020  ? Procedure: COLONOSCOPY WITH PROPOFOL;  Surgeon: Toledo, Benay Pike, MD;  Location: [facility] ENDOSCOPY;  Service: Gastroenterology;  Laterality: N/A;  ? FEMUR FRACTURE SURGERY Left 04/18/2015  ? FRACTURE SURGERY    ? TONSILLECTOMY    ? ? ?FAMILY HISTORY :   ?Family History  ?Problem Relation Age of Onset  ? Breast cancer Cousin   ?     paternal  ? Kidney cancer Cousin   ?     maternal  ? Leukemia Cousin   ?     maternal  ? Stomach cancer Other   ?     uncle  ? Prostate cancer Other   ?     grandfather  ? Skin cancer Other   ?     maternal grandparents  ? ? ?SOCIAL HISTORY:   ?Social History  ? ?Tobacco Use  ? Smoking status: Never  ? Smokeless tobacco: Never  ?Vaping Use  ? Vaping Use: Never used  ?Substance Use Topics  ? Alcohol use: No  ? Drug use: No  ? ? ?ALLERGIES:  is allergic to  mold extract [trichophyton], codeine, indomethacin, other, sulfa antibiotics, cashew nut (anacardium occidentale) skin test, nickel, and sulfamethoxazole-trimethoprim. ? ?MEDICATIONS:  ?Current Outpati

## 2021-11-17 ENCOUNTER — Other Ambulatory Visit: Payer: Self-pay

## 2021-11-17 ENCOUNTER — Ambulatory Visit
Admission: RE | Admit: 2021-11-17 | Discharge: 2021-11-17 | Disposition: A | Discharge status: Home / Self Care | Payer: Medicare HMO | Source: Ambulatory Visit | Attending: Radiation Oncology | Admitting: Radiation Oncology

## 2021-11-17 ENCOUNTER — Encounter: Payer: Self-pay | Admitting: Radiation Oncology

## 2021-11-17 VITALS — BP 147/65 | HR 70 | Temp 97.8°F | Resp 16 | Ht 62.0 in | Wt 163.6 lb

## 2021-11-17 DIAGNOSIS — C50411 Malignant neoplasm of upper-outer quadrant of right female breast: Secondary | ICD-10-CM | POA: Diagnosis not present

## 2021-11-17 DIAGNOSIS — Z8781 Personal history of (healed) traumatic fracture: Secondary | ICD-10-CM | POA: Insufficient documentation

## 2021-11-17 DIAGNOSIS — M810 Age-related osteoporosis without current pathological fracture: Secondary | ICD-10-CM | POA: Insufficient documentation

## 2021-11-17 DIAGNOSIS — Z808 Family history of malignant neoplasm of other organs or systems: Secondary | ICD-10-CM | POA: Insufficient documentation

## 2021-11-17 DIAGNOSIS — D472 Monoclonal gammopathy: Secondary | ICD-10-CM | POA: Diagnosis not present

## 2021-11-17 DIAGNOSIS — Z79810 Long term (current) use of selective estrogen receptor modulators (SERMs): Secondary | ICD-10-CM | POA: Diagnosis not present

## 2021-11-17 DIAGNOSIS — Z800 Family history of malignant neoplasm of digestive organs: Secondary | ICD-10-CM | POA: Insufficient documentation

## 2021-11-17 DIAGNOSIS — Z7982 Long term (current) use of aspirin: Secondary | ICD-10-CM | POA: Diagnosis not present

## 2021-11-17 DIAGNOSIS — E039 Hypothyroidism, unspecified: Secondary | ICD-10-CM | POA: Diagnosis not present

## 2021-11-17 DIAGNOSIS — Z79899 Other long term (current) drug therapy: Secondary | ICD-10-CM | POA: Diagnosis not present

## 2021-11-17 DIAGNOSIS — E785 Hyperlipidemia, unspecified: Secondary | ICD-10-CM | POA: Diagnosis not present

## 2021-11-17 DIAGNOSIS — I10 Essential (primary) hypertension: Secondary | ICD-10-CM | POA: Diagnosis not present

## 2021-11-17 DIAGNOSIS — Z803 Family history of malignant neoplasm of breast: Secondary | ICD-10-CM | POA: Diagnosis not present

## 2021-11-17 DIAGNOSIS — Z86010 Personal history of colonic polyps: Secondary | ICD-10-CM | POA: Insufficient documentation

## 2021-11-17 DIAGNOSIS — Z806 Family history of leukemia: Secondary | ICD-10-CM | POA: Insufficient documentation

## 2021-11-17 DIAGNOSIS — Z8051 Family history of malignant neoplasm of kidney: Secondary | ICD-10-CM | POA: Insufficient documentation

## 2021-11-17 DIAGNOSIS — Z8042 Family history of malignant neoplasm of prostate: Secondary | ICD-10-CM | POA: Diagnosis not present

## 2021-11-17 DIAGNOSIS — Z170 Estrogen receptor positive status [ER+]: Secondary | ICD-10-CM | POA: Insufficient documentation

## 2021-11-17 DIAGNOSIS — G4730 Sleep apnea, unspecified: Secondary | ICD-10-CM | POA: Diagnosis not present

## 2021-11-17 NOTE — Consult Note (Signed)
NEW PATIENT EVALUATION  Name: Kristin Wise  MRN: [id number]  Date:   11/17/2021     DOB: 03-Aug-1941   This 81 y.o. female patient presents to the clinic for initial evaluation of stage Ia (T1b N0 M0) ER positive PR negative HER2/neu not overexpressed invasive lobular carcinoma of the right breast status post wide local excision and sentinel node biopsy.  REFERRING PHYSICIAN: Jerl Mina, MD  CHIEF COMPLAINT:  Chief Complaint  Patient presents with   Breast Cancer    Initial consultation    DIAGNOSIS: The encounter diagnosis was Carcinoma of upper-outer quadrant of right breast in female, estrogen receptor positive (HCC).   PREVIOUS INVESTIGATIONS:  Mammogram and ultrasound reviewed Pathology reports reviewed Clinical notes reviewed  HPI: Patient is a 81 year old female who presents with an abnormal mammogram of the right breast showing a irregular mass associated with posterior acoustic shadowing at the 9:30 position of the right breast 11 cm from the nipple.  Tumor measured 0.8 cm in greatest dimension.  She underwent ultrasound-guided biopsy positive for invasive lobular carcinoma.  She then underwent a wide local excision and sentinel node biopsy showing a 9 mm invasive lobular carcinoma with margins clear at 3 mm.  4 sentinel lymph nodes were examined all negative for malignancy.  Tumor was ER positive PR negative HER2/neu not overexpressed.  She has had chronic problems with her right arm its been broken twice.  She continues to have pain in her right axilla secondary to surgery she overall feels fairly weak today.  She otherwise specifically denies breast tenderness cough.  She has been seen by medical oncology who will recommend tamoxifen.  PLANNED TREATMENT REGIMEN: Right hypofractionated whole breast radiation  PAST MEDICAL HISTORY:  has a past medical history of Arthritis, Breast cancer (HCC), Bronchitis, Cancer (HCC), Change in bowel habits, Colon polyps, Femur  fracture, left (HCC), Hepatitis, Hyperlipidemia, Hypertension, Hypothyroidism, MGUS (monoclonal gammopathy of unknown significance), Osteoporosis, Postmenopausal, Rectal bleeding, Seasonal allergies, Sleep apnea, Stroke (HCC) (??), and Vitamin D deficiency.    PAST SURGICAL HISTORY:  Past Surgical History:  Procedure Laterality Date   AXILLARY LYMPH NODE BIOPSY Right 11/01/2021   Procedure: AXILLARY LYMPH NODE BIOPSY;  Surgeon: Earline Mayotte, MD;  Location: [facility]S;  Service: General;  Laterality: Right;   BREAST BIOPSY Left 09/30/2008   Patient presented with acute swelling in the breast, aspiration culture negative.  Subsequent core biopsy showing fragments of cyst wall with granulation tissue, fibrosis and focal residual epithelial lining.  No malignancy.   BREAST BIOPSY Right 09/22/2021   ULTRASOUND-GUIDED BIOPSY: - INVASIVE MAMMARY CARCINOMA WITH LOBULAR FEATURES   BREAST CYST ASPIRATION     BREAST LUMPECTOMY WITH NEEDLE LOCALIZATION Right 11/01/2021   Procedure: BREAST LUMPECTOMY WITH NEEDLE LOCALIZATION;  Surgeon: Earline Mayotte, MD;  Location: [facility];  Service: General;  Laterality: Right;   BREAST SURGERY Right 11/01/2021   wire loc w/ sn for lumpectomy   CATARACT EXTRACTION W/PHACO Left 03/02/2021   Procedure: CATARACT EXTRACTION PHACO AND INTRAOCULAR LENS PLACEMENT (IOC) LEFT 6.40 00:35.2;  Surgeon: Galen Manila, MD;  Location: [facility];  Service: Ophthalmology;  Laterality: Left;   CATARACT EXTRACTION W/PHACO Right 03/16/2021   Procedure: CATARACT EXTRACTION PHACO AND INTRAOCULAR LENS PLACEMENT (IOC) RIGHT;  Surgeon: Galen Manila, MD;  [facility];  Service: Ophthalmology;  Laterality: Right;  7.42 0:51.0   COLONOSCOPY WITH PROPOFOL N/A 02/05/2015   Procedure: COLONOSCOPY WITH PROPOFOL;  Surgeon: Scot Jun, MD;  Location: [facility];  Service: Endoscopy;  Laterality: N/A;   COLONOSCOPY WITH PROPOFOL N/A 04/08/2020    Procedure: COLONOSCOPY WITH PROPOFOL;  Surgeon: Toledo, Boykin Nearing, MD;  Location: [facility] ENDOSCOPY;  Service: Gastroenterology;  Laterality: N/A;   FEMUR FRACTURE SURGERY Left 04/18/2015   FRACTURE SURGERY     TONSILLECTOMY      FAMILY HISTORY: family history includes Breast cancer in her cousin; Kidney cancer in her cousin; Leukemia in her cousin; Prostate cancer in an other family member; Skin cancer in an other family member; Stomach cancer in an other family member.  SOCIAL HISTORY:  reports that she has never smoked. She has never used smokeless tobacco. She reports that she does not drink alcohol and does not use drugs.  ALLERGIES: Mold extract [trichophyton], Codeine, Indomethacin, Other, Sulfa antibiotics, Cashew nut (anacardium occidentale) skin test, Nickel, and Sulfamethoxazole-trimethoprim  MEDICATIONS:  Current Outpatient Medications  Medication Sig Dispense Refill   Alpha-Lipoic Acid 600 MG TABS Take 600 mg by mouth daily.     aspirin 81 MG EC tablet Take 81 mg by mouth daily.     Biotin 5 MG TBDP Take 5 mg by mouth daily.     CALCIUM-VITAMIN D PO Take 1 tablet by mouth daily.     COVID-19 mRNA Vac-TriS, Pfizer, SUSP injection USE AS DIRECTED .3 mL 0   ibuprofen (ADVIL,MOTRIN) 200 MG tablet Take 400 mg by mouth every 8 (eight) hours as needed for moderate pain.     L-Lysine 500 MG TABS Take 1,000 mg by mouth 4 (four) times daily as needed (mouth sores).     losartan-hydrochlorothiazide (HYZAAR) 50-12.5 MG tablet Take 0.5 tablets by mouth every morning.     Lutein 10 MG TABS Take 10 mg by mouth daily.     rosuvastatin (CRESTOR) 5 MG tablet Take 5 mg by mouth every other day. MORNING     Vitamin D, Ergocalciferol, (DRISDOL) 1.25 MG (50000 UNIT) CAPS capsule Take 50,000 Units by mouth every Sunday.     No current facility-administered medications for this encounter.    ECOG PERFORMANCE STATUS:  0 - Asymptomatic  REVIEW OF SYSTEMS: Patient denies any weight loss, fatigue,  weakness, fever, chills or night sweats. Patient denies any loss of vision, blurred vision. Patient denies any ringing  of the ears or hearing loss. No irregular heartbeat. Patient denies heart murmur or history of fainting. Patient denies any chest pain or pain radiating to her upper extremities. Patient denies any shortness of breath, difficulty breathing at night, cough or hemoptysis. Patient denies any swelling in the lower legs. Patient denies any nausea vomiting, vomiting of blood, or coffee ground material in the vomitus. Patient denies any stomach pain. Patient states has had normal bowel movements no significant constipation or diarrhea. Patient denies any dysuria, hematuria or significant nocturia. Patient denies any problems walking, swelling in the joints or loss of balance. Patient denies any skin changes, loss of hair or loss of weight. Patient denies any excessive worrying or anxiety or significant depression. Patient denies any problems with insomnia. Patient denies excessive thirst, polyuria, polydipsia. Patient denies any swollen glands, patient denies easy bruising or easy bleeding. Patient denies any recent infections, allergies or URI. Patient "s visual fields have not changed significantly in recent time.   PHYSICAL EXAM: BP (!) 147/65 (BP Location: Left Arm, Patient Position: Sitting)   Pulse 70   Temp 97.8 F (36.6 C) (Tympanic)   Resp 16   Ht 5\' 2"  (1.575 m)   Wt 163 lb 9.6 oz (  74.2 kg)   BMI 29.92 kg/m  Right breast is wide local excision which is healed well.  No dominant masses noted in either breast.  No axillary or supraclavicular adenopathy identified.  She has a small seroma in her right axilla.  She has some limited rotation of her right upper extremity secondary to previous fractures.  Well-developed well-nourished patient in NAD. HEENT reveals PERLA, EOMI, discs not visualized.  Oral cavity is clear. No oral mucosal lesions are identified. Neck is clear without evidence  of cervical or supraclavicular adenopathy. Lungs are clear to A&P. Cardiac examination is essentially unremarkable with regular rate and rhythm without murmur rub or thrill. Abdomen is benign with no organomegaly or masses noted. Motor sensory and DTR levels are equal and symmetric in the upper and lower extremities. Cranial nerves II through XII are grossly intact. Proprioception is intact. No peripheral adenopathy or edema is identified. No motor or sensory levels are noted. Crude visual fields are within normal range.  LABORATORY DATA: Pathology reports reviewed    RADIOLOGY RESULTS: Mammogram and ultrasound reviewed compatible with above-stated findings   IMPRESSION: Stage Ia invasive lobular carcinoma of the right breast status post wide local excision and sentinel node biopsy in 81 year old female  PLAN: At this time I have recommended a course of hypofractionated radiation therapy to her right breast over 3 weeks.  Based on the close to 3 mm scar would also boost 1000 cGy in 5 fractions using electron beam.  Risks and benefits of treatment including skin reaction fatigue alteration of blood counts possible inclusion of superficial lung all were reviewed in detail with the patient.  She is reluctant to undergo radiation although I believe she will show up for her simulation appointment next week.  She seems to comprehend my recommendations well.  I would like to take this opportunity to thank you for allowing me to participate in the care of your patient.Carmina Miller, MD

## 2021-11-22 ENCOUNTER — Ambulatory Visit
Admission: RE | Admit: 2021-11-22 | Discharge: 2021-11-22 | Disposition: A | Discharge status: Home / Self Care | Payer: Medicare HMO | Source: Ambulatory Visit | Attending: Radiation Oncology | Admitting: Radiation Oncology

## 2021-11-22 DIAGNOSIS — C50411 Malignant neoplasm of upper-outer quadrant of right female breast: Secondary | ICD-10-CM | POA: Diagnosis not present

## 2021-11-22 DIAGNOSIS — Z170 Estrogen receptor positive status [ER+]: Secondary | ICD-10-CM | POA: Diagnosis not present

## 2021-11-24 DIAGNOSIS — Z170 Estrogen receptor positive status [ER+]: Secondary | ICD-10-CM | POA: Diagnosis not present

## 2021-11-24 DIAGNOSIS — C50411 Malignant neoplasm of upper-outer quadrant of right female breast: Secondary | ICD-10-CM | POA: Diagnosis not present

## 2021-11-25 ENCOUNTER — Other Ambulatory Visit: Payer: Self-pay | Admitting: *Deleted

## 2021-11-25 DIAGNOSIS — Z170 Estrogen receptor positive status [ER+]: Secondary | ICD-10-CM

## 2021-11-29 ENCOUNTER — Ambulatory Visit: Admission: RE | Admit: 2021-11-29 | Payer: Medicare HMO | Source: Ambulatory Visit

## 2021-11-29 DIAGNOSIS — Z170 Estrogen receptor positive status [ER+]: Secondary | ICD-10-CM | POA: Diagnosis not present

## 2021-11-29 DIAGNOSIS — C50411 Malignant neoplasm of upper-outer quadrant of right female breast: Secondary | ICD-10-CM | POA: Diagnosis not present

## 2021-11-30 ENCOUNTER — Other Ambulatory Visit: Payer: Self-pay | Admitting: General Surgery

## 2021-11-30 ENCOUNTER — Ambulatory Visit
Admission: RE | Admit: 2021-11-30 | Discharge: 2021-11-30 | Disposition: A | Discharge status: Home / Self Care | Payer: Medicare HMO | Source: Ambulatory Visit | Attending: Radiation Oncology | Admitting: Radiation Oncology

## 2021-11-30 DIAGNOSIS — C50411 Malignant neoplasm of upper-outer quadrant of right female breast: Secondary | ICD-10-CM | POA: Diagnosis not present

## 2021-11-30 DIAGNOSIS — R0989 Other specified symptoms and signs involving the circulatory and respiratory systems: Secondary | ICD-10-CM

## 2021-11-30 DIAGNOSIS — Z170 Estrogen receptor positive status [ER+]: Secondary | ICD-10-CM | POA: Diagnosis not present

## 2021-12-01 ENCOUNTER — Ambulatory Visit
Admission: RE | Admit: 2021-12-01 | Discharge: 2021-12-01 | Disposition: A | Discharge status: Home / Self Care | Payer: Medicare HMO | Source: Ambulatory Visit | Attending: Radiation Oncology | Admitting: Radiation Oncology

## 2021-12-01 DIAGNOSIS — C50411 Malignant neoplasm of upper-outer quadrant of right female breast: Secondary | ICD-10-CM | POA: Diagnosis not present

## 2021-12-01 DIAGNOSIS — Z170 Estrogen receptor positive status [ER+]: Secondary | ICD-10-CM | POA: Diagnosis not present

## 2021-12-02 ENCOUNTER — Ambulatory Visit
Admission: RE | Admit: 2021-12-02 | Discharge: 2021-12-02 | Disposition: A | Discharge status: Home / Self Care | Payer: Medicare HMO | Source: Ambulatory Visit | Attending: Radiation Oncology | Admitting: Radiation Oncology

## 2021-12-02 DIAGNOSIS — Z170 Estrogen receptor positive status [ER+]: Secondary | ICD-10-CM | POA: Diagnosis not present

## 2021-12-02 DIAGNOSIS — C50411 Malignant neoplasm of upper-outer quadrant of right female breast: Secondary | ICD-10-CM | POA: Diagnosis not present

## 2021-12-03 ENCOUNTER — Ambulatory Visit
Admission: RE | Admit: 2021-12-03 | Discharge: 2021-12-03 | Disposition: A | Discharge status: Home / Self Care | Payer: Medicare HMO | Source: Ambulatory Visit | Attending: Radiation Oncology | Admitting: Radiation Oncology

## 2021-12-03 DIAGNOSIS — C50411 Malignant neoplasm of upper-outer quadrant of right female breast: Secondary | ICD-10-CM | POA: Diagnosis not present

## 2021-12-03 DIAGNOSIS — Z170 Estrogen receptor positive status [ER+]: Secondary | ICD-10-CM | POA: Diagnosis not present

## 2021-12-06 ENCOUNTER — Ambulatory Visit
Admission: RE | Admit: 2021-12-06 | Discharge: 2021-12-06 | Disposition: A | Discharge status: Home / Self Care | Payer: Medicare HMO | Source: Ambulatory Visit | Attending: Radiation Oncology | Admitting: Radiation Oncology

## 2021-12-06 DIAGNOSIS — Z170 Estrogen receptor positive status [ER+]: Secondary | ICD-10-CM | POA: Diagnosis not present

## 2021-12-06 DIAGNOSIS — C50411 Malignant neoplasm of upper-outer quadrant of right female breast: Secondary | ICD-10-CM | POA: Diagnosis not present

## 2021-12-07 ENCOUNTER — Ambulatory Visit
Admission: RE | Admit: 2021-12-07 | Discharge: 2021-12-07 | Disposition: A | Discharge status: Home / Self Care | Payer: Medicare HMO | Source: Ambulatory Visit | Attending: Radiation Oncology | Admitting: Radiation Oncology

## 2021-12-07 DIAGNOSIS — Z170 Estrogen receptor positive status [ER+]: Secondary | ICD-10-CM | POA: Diagnosis not present

## 2021-12-07 DIAGNOSIS — C50411 Malignant neoplasm of upper-outer quadrant of right female breast: Secondary | ICD-10-CM | POA: Diagnosis not present

## 2021-12-08 ENCOUNTER — Ambulatory Visit
Admission: RE | Admit: 2021-12-08 | Discharge: 2021-12-08 | Disposition: A | Discharge status: Home / Self Care | Payer: Medicare HMO | Source: Ambulatory Visit | Attending: Radiation Oncology | Admitting: Radiation Oncology

## 2021-12-08 DIAGNOSIS — Z170 Estrogen receptor positive status [ER+]: Secondary | ICD-10-CM | POA: Diagnosis not present

## 2021-12-08 DIAGNOSIS — C50411 Malignant neoplasm of upper-outer quadrant of right female breast: Secondary | ICD-10-CM | POA: Diagnosis not present

## 2021-12-09 ENCOUNTER — Ambulatory Visit
Admission: RE | Admit: 2021-12-09 | Discharge: 2021-12-09 | Disposition: A | Discharge status: Home / Self Care | Payer: Medicare HMO | Source: Ambulatory Visit | Attending: Radiation Oncology | Admitting: Radiation Oncology

## 2021-12-09 DIAGNOSIS — C50411 Malignant neoplasm of upper-outer quadrant of right female breast: Secondary | ICD-10-CM | POA: Diagnosis not present

## 2021-12-09 DIAGNOSIS — Z170 Estrogen receptor positive status [ER+]: Secondary | ICD-10-CM | POA: Diagnosis not present

## 2021-12-10 ENCOUNTER — Ambulatory Visit
Admission: RE | Admit: 2021-12-10 | Discharge: 2021-12-10 | Disposition: A | Discharge status: Home / Self Care | Payer: Medicare HMO | Source: Ambulatory Visit | Attending: Radiation Oncology | Admitting: Radiation Oncology

## 2021-12-10 DIAGNOSIS — C50411 Malignant neoplasm of upper-outer quadrant of right female breast: Secondary | ICD-10-CM | POA: Diagnosis not present

## 2021-12-10 DIAGNOSIS — Z170 Estrogen receptor positive status [ER+]: Secondary | ICD-10-CM | POA: Diagnosis not present

## 2021-12-13 ENCOUNTER — Ambulatory Visit
Admission: RE | Admit: 2021-12-13 | Discharge: 2021-12-13 | Disposition: A | Discharge status: Home / Self Care | Payer: Medicare HMO | Source: Ambulatory Visit | Attending: Radiation Oncology | Admitting: Radiation Oncology

## 2021-12-13 DIAGNOSIS — C50411 Malignant neoplasm of upper-outer quadrant of right female breast: Secondary | ICD-10-CM | POA: Diagnosis not present

## 2021-12-13 DIAGNOSIS — Z170 Estrogen receptor positive status [ER+]: Secondary | ICD-10-CM | POA: Diagnosis not present

## 2021-12-14 ENCOUNTER — Ambulatory Visit
Admission: RE | Admit: 2021-12-14 | Discharge: 2021-12-14 | Disposition: A | Discharge status: Home / Self Care | Payer: Medicare HMO | Source: Ambulatory Visit | Attending: Radiation Oncology | Admitting: Radiation Oncology

## 2021-12-14 ENCOUNTER — Other Ambulatory Visit: Payer: Self-pay

## 2021-12-14 DIAGNOSIS — Z170 Estrogen receptor positive status [ER+]: Secondary | ICD-10-CM | POA: Diagnosis not present

## 2021-12-14 DIAGNOSIS — C50411 Malignant neoplasm of upper-outer quadrant of right female breast: Secondary | ICD-10-CM | POA: Diagnosis not present

## 2021-12-14 LAB — RAD ONC ARIA SESSION SUMMARY
Course Elapsed Days: 14
Plan Fractions Treated to Date: 11
Plan Prescribed Dose Per Fraction: 2.66 Gy
Plan Total Fractions Prescribed: 16
Plan Total Prescribed Dose: 42.56 Gy
Reference Point Dosage Given to Date: 29.26 Gy
Reference Point Session Dosage Given: 2.66 Gy
Session Number: 11

## 2021-12-15 ENCOUNTER — Other Ambulatory Visit: Payer: Self-pay

## 2021-12-15 ENCOUNTER — Ambulatory Visit
Admission: RE | Admit: 2021-12-15 | Discharge: 2021-12-15 | Disposition: A | Discharge status: Home / Self Care | Payer: Medicare HMO | Source: Ambulatory Visit | Attending: Radiation Oncology | Admitting: Radiation Oncology

## 2021-12-15 ENCOUNTER — Inpatient Hospital Stay: Payer: Medicare HMO

## 2021-12-15 DIAGNOSIS — Z170 Estrogen receptor positive status [ER+]: Secondary | ICD-10-CM | POA: Diagnosis not present

## 2021-12-15 DIAGNOSIS — C50411 Malignant neoplasm of upper-outer quadrant of right female breast: Secondary | ICD-10-CM | POA: Diagnosis not present

## 2021-12-15 LAB — RAD ONC ARIA SESSION SUMMARY
Course Elapsed Days: 15
Plan Fractions Treated to Date: 12
Plan Prescribed Dose Per Fraction: 2.66 Gy
Plan Total Fractions Prescribed: 16
Plan Total Prescribed Dose: 42.56 Gy
Reference Point Dosage Given to Date: 31.92 Gy
Reference Point Session Dosage Given: 2.66 Gy
Session Number: 12

## 2021-12-16 ENCOUNTER — Ambulatory Visit
Admission: RE | Admit: 2021-12-16 | Discharge: 2021-12-16 | Disposition: A | Discharge status: Home / Self Care | Payer: Medicare HMO | Source: Ambulatory Visit | Attending: Radiation Oncology | Admitting: Radiation Oncology

## 2021-12-16 ENCOUNTER — Other Ambulatory Visit: Payer: Self-pay

## 2021-12-16 DIAGNOSIS — C50411 Malignant neoplasm of upper-outer quadrant of right female breast: Secondary | ICD-10-CM | POA: Diagnosis not present

## 2021-12-16 DIAGNOSIS — Z170 Estrogen receptor positive status [ER+]: Secondary | ICD-10-CM | POA: Diagnosis not present

## 2021-12-16 LAB — RAD ONC ARIA SESSION SUMMARY
Course Elapsed Days: 16
Plan Fractions Treated to Date: 13
Plan Prescribed Dose Per Fraction: 2.66 Gy
Plan Total Fractions Prescribed: 16
Plan Total Prescribed Dose: 42.56 Gy
Reference Point Dosage Given to Date: 34.58 Gy
Reference Point Session Dosage Given: 2.66 Gy
Session Number: 13

## 2021-12-17 ENCOUNTER — Other Ambulatory Visit: Payer: Self-pay

## 2021-12-17 ENCOUNTER — Telehealth: Payer: Self-pay | Admitting: *Deleted

## 2021-12-17 ENCOUNTER — Encounter: Payer: Self-pay | Admitting: Radiation Oncology

## 2021-12-17 ENCOUNTER — Ambulatory Visit
Admission: RE | Admit: 2021-12-17 | Discharge: 2021-12-17 | Disposition: A | Discharge status: Home / Self Care | Payer: Medicare HMO | Source: Ambulatory Visit | Attending: Radiation Oncology | Admitting: Radiation Oncology

## 2021-12-17 DIAGNOSIS — C50411 Malignant neoplasm of upper-outer quadrant of right female breast: Secondary | ICD-10-CM | POA: Diagnosis not present

## 2021-12-17 DIAGNOSIS — Z170 Estrogen receptor positive status [ER+]: Secondary | ICD-10-CM | POA: Diagnosis not present

## 2021-12-17 LAB — RAD ONC ARIA SESSION SUMMARY
Course Elapsed Days: 17
Plan Fractions Treated to Date: 14
Plan Prescribed Dose Per Fraction: 2.66 Gy
Plan Total Fractions Prescribed: 16
Plan Total Prescribed Dose: 42.56 Gy
Reference Point Dosage Given to Date: 37.24 Gy
Reference Point Session Dosage Given: 2.66 Gy
Session Number: 14

## 2021-12-17 NOTE — Telephone Encounter (Signed)
Patient called reporting that she had labs drawn at [place] and there is no diff on the CBC and she is asking if she needs the diff? ?

## 2021-12-17 NOTE — Telephone Encounter (Signed)
Call returned to patient and informed that no diff is needed. She thanked me for letting her know ?

## 2021-12-20 ENCOUNTER — Ambulatory Visit
Admission: RE | Admit: 2021-12-20 | Discharge: 2021-12-20 | Disposition: A | Discharge status: Home / Self Care | Payer: Medicare HMO | Source: Ambulatory Visit | Attending: Radiation Oncology | Admitting: Radiation Oncology

## 2021-12-20 ENCOUNTER — Other Ambulatory Visit: Payer: Self-pay

## 2021-12-20 DIAGNOSIS — Z170 Estrogen receptor positive status [ER+]: Secondary | ICD-10-CM | POA: Diagnosis not present

## 2021-12-20 DIAGNOSIS — C50411 Malignant neoplasm of upper-outer quadrant of right female breast: Secondary | ICD-10-CM | POA: Diagnosis not present

## 2021-12-20 LAB — RAD ONC ARIA SESSION SUMMARY
Course Elapsed Days: 20
Plan Fractions Treated to Date: 15
Plan Prescribed Dose Per Fraction: 2.66 Gy
Plan Total Fractions Prescribed: 16
Plan Total Prescribed Dose: 42.56 Gy
Reference Point Dosage Given to Date: 39.9 Gy
Reference Point Session Dosage Given: 2.66 Gy
Session Number: 15

## 2021-12-21 ENCOUNTER — Ambulatory Visit
Admission: RE | Admit: 2021-12-21 | Discharge: 2021-12-21 | Disposition: A | Discharge status: Home / Self Care | Payer: Medicare HMO | Source: Ambulatory Visit | Attending: Radiation Oncology | Admitting: Radiation Oncology

## 2021-12-21 ENCOUNTER — Other Ambulatory Visit: Payer: Self-pay

## 2021-12-21 DIAGNOSIS — Z170 Estrogen receptor positive status [ER+]: Secondary | ICD-10-CM | POA: Diagnosis not present

## 2021-12-21 DIAGNOSIS — C50411 Malignant neoplasm of upper-outer quadrant of right female breast: Secondary | ICD-10-CM | POA: Diagnosis not present

## 2021-12-21 LAB — RAD ONC ARIA SESSION SUMMARY
Course Elapsed Days: 21
Plan Fractions Treated to Date: 16
Plan Prescribed Dose Per Fraction: 2.66 Gy
Plan Total Fractions Prescribed: 16
Plan Total Prescribed Dose: 42.56 Gy
Reference Point Dosage Given to Date: 42.56 Gy
Reference Point Session Dosage Given: 2.66 Gy
Session Number: 16

## 2021-12-22 ENCOUNTER — Ambulatory Visit
Admission: RE | Admit: 2021-12-22 | Discharge: 2021-12-22 | Disposition: A | Discharge status: Home / Self Care | Payer: Medicare HMO | Source: Ambulatory Visit | Attending: Radiation Oncology | Admitting: Radiation Oncology

## 2021-12-22 ENCOUNTER — Other Ambulatory Visit: Payer: Self-pay

## 2021-12-22 DIAGNOSIS — Z170 Estrogen receptor positive status [ER+]: Secondary | ICD-10-CM | POA: Diagnosis not present

## 2021-12-22 DIAGNOSIS — C50411 Malignant neoplasm of upper-outer quadrant of right female breast: Secondary | ICD-10-CM | POA: Diagnosis not present

## 2021-12-22 LAB — RAD ONC ARIA SESSION SUMMARY
Course Elapsed Days: 22
Plan Fractions Treated to Date: 1
Plan Prescribed Dose Per Fraction: 2 Gy
Plan Total Fractions Prescribed: 5
Plan Total Prescribed Dose: 10 Gy
Reference Point Dosage Given to Date: 44.56 Gy
Reference Point Session Dosage Given: 2 Gy
Session Number: 17

## 2021-12-23 ENCOUNTER — Other Ambulatory Visit: Payer: Self-pay

## 2021-12-23 ENCOUNTER — Ambulatory Visit
Admission: RE | Admit: 2021-12-23 | Discharge: 2021-12-23 | Disposition: A | Discharge status: Home / Self Care | Payer: Medicare HMO | Source: Ambulatory Visit | Attending: Radiation Oncology | Admitting: Radiation Oncology

## 2021-12-23 DIAGNOSIS — C50411 Malignant neoplasm of upper-outer quadrant of right female breast: Secondary | ICD-10-CM | POA: Diagnosis not present

## 2021-12-23 DIAGNOSIS — Z170 Estrogen receptor positive status [ER+]: Secondary | ICD-10-CM | POA: Diagnosis not present

## 2021-12-23 LAB — RAD ONC ARIA SESSION SUMMARY
Course Elapsed Days: 23
Plan Fractions Treated to Date: 2
Plan Prescribed Dose Per Fraction: 2 Gy
Plan Total Fractions Prescribed: 5
Plan Total Prescribed Dose: 10 Gy
Reference Point Dosage Given to Date: 46.56 Gy
Reference Point Session Dosage Given: 2 Gy
Session Number: 18

## 2021-12-24 ENCOUNTER — Other Ambulatory Visit: Payer: Self-pay

## 2021-12-24 ENCOUNTER — Ambulatory Visit
Admission: RE | Admit: 2021-12-24 | Discharge: 2021-12-24 | Disposition: A | Discharge status: Home / Self Care | Payer: Medicare HMO | Source: Ambulatory Visit | Attending: Radiation Oncology | Admitting: Radiation Oncology

## 2021-12-24 DIAGNOSIS — C50411 Malignant neoplasm of upper-outer quadrant of right female breast: Secondary | ICD-10-CM | POA: Diagnosis not present

## 2021-12-24 DIAGNOSIS — Z170 Estrogen receptor positive status [ER+]: Secondary | ICD-10-CM | POA: Diagnosis not present

## 2021-12-24 LAB — RAD ONC ARIA SESSION SUMMARY
Course Elapsed Days: 24
Plan Fractions Treated to Date: 3
Plan Prescribed Dose Per Fraction: 2 Gy
Plan Total Fractions Prescribed: 5
Plan Total Prescribed Dose: 10 Gy
Reference Point Dosage Given to Date: 48.56 Gy
Reference Point Session Dosage Given: 2 Gy
Session Number: 19

## 2021-12-27 ENCOUNTER — Other Ambulatory Visit: Payer: Self-pay

## 2021-12-27 ENCOUNTER — Ambulatory Visit
Admission: RE | Admit: 2021-12-27 | Discharge: 2021-12-27 | Disposition: A | Discharge status: Home / Self Care | Payer: Medicare HMO | Source: Ambulatory Visit | Attending: Radiation Oncology | Admitting: Radiation Oncology

## 2021-12-27 DIAGNOSIS — Z170 Estrogen receptor positive status [ER+]: Secondary | ICD-10-CM | POA: Diagnosis not present

## 2021-12-27 DIAGNOSIS — C50411 Malignant neoplasm of upper-outer quadrant of right female breast: Secondary | ICD-10-CM | POA: Diagnosis not present

## 2021-12-27 LAB — RAD ONC ARIA SESSION SUMMARY
Course Elapsed Days: 27
Plan Fractions Treated to Date: 4
Plan Prescribed Dose Per Fraction: 2 Gy
Plan Total Fractions Prescribed: 5
Plan Total Prescribed Dose: 10 Gy
Reference Point Dosage Given to Date: 50.56 Gy
Reference Point Session Dosage Given: 2 Gy
Session Number: 20

## 2021-12-28 ENCOUNTER — Other Ambulatory Visit: Payer: Self-pay

## 2021-12-28 ENCOUNTER — Ambulatory Visit
Admission: RE | Admit: 2021-12-28 | Discharge: 2021-12-28 | Disposition: A | Discharge status: Home / Self Care | Payer: Medicare HMO | Source: Ambulatory Visit | Attending: Radiation Oncology | Admitting: Radiation Oncology

## 2021-12-28 DIAGNOSIS — Z170 Estrogen receptor positive status [ER+]: Secondary | ICD-10-CM | POA: Diagnosis not present

## 2021-12-28 DIAGNOSIS — C50411 Malignant neoplasm of upper-outer quadrant of right female breast: Secondary | ICD-10-CM | POA: Diagnosis not present

## 2021-12-28 LAB — RAD ONC ARIA SESSION SUMMARY
Course Elapsed Days: 28
Plan Fractions Treated to Date: 5
Plan Prescribed Dose Per Fraction: 2 Gy
Plan Total Fractions Prescribed: 5
Plan Total Prescribed Dose: 10 Gy
Reference Point Dosage Given to Date: 52.56 Gy
Reference Point Session Dosage Given: 2 Gy
Session Number: 21

## 2022-01-12 ENCOUNTER — Inpatient Hospital Stay: Payer: Medicare HMO | Attending: Internal Medicine | Admitting: Internal Medicine

## 2022-01-12 DIAGNOSIS — Y842 Radiological procedure and radiotherapy as the cause of abnormal reaction of the patient, or of later complication, without mention of misadventure at the time of the procedure: Secondary | ICD-10-CM | POA: Diagnosis not present

## 2022-01-12 DIAGNOSIS — C50411 Malignant neoplasm of upper-outer quadrant of right female breast: Secondary | ICD-10-CM | POA: Diagnosis not present

## 2022-01-12 DIAGNOSIS — H6122 Impacted cerumen, left ear: Secondary | ICD-10-CM | POA: Insufficient documentation

## 2022-01-12 DIAGNOSIS — Z923 Personal history of irradiation: Secondary | ICD-10-CM | POA: Diagnosis not present

## 2022-01-12 DIAGNOSIS — Z79899 Other long term (current) drug therapy: Secondary | ICD-10-CM | POA: Insufficient documentation

## 2022-01-12 DIAGNOSIS — Z7982 Long term (current) use of aspirin: Secondary | ICD-10-CM | POA: Insufficient documentation

## 2022-01-12 DIAGNOSIS — L598 Other specified disorders of the skin and subcutaneous tissue related to radiation: Secondary | ICD-10-CM | POA: Diagnosis not present

## 2022-01-12 DIAGNOSIS — Z170 Estrogen receptor positive status [ER+]: Secondary | ICD-10-CM | POA: Insufficient documentation

## 2022-01-12 DIAGNOSIS — D472 Monoclonal gammopathy: Secondary | ICD-10-CM | POA: Insufficient documentation

## 2022-01-12 DIAGNOSIS — M810 Age-related osteoporosis without current pathological fracture: Secondary | ICD-10-CM | POA: Insufficient documentation

## 2022-01-12 DIAGNOSIS — H838X2 Other specified diseases of left inner ear: Secondary | ICD-10-CM | POA: Diagnosis not present

## 2022-01-12 NOTE — Assessment & Plan Note (Addendum)
#   RIGHT INVASIVE LOBULAR CANCER- ER-POSITIVE/PR-NEG; her 2-NEGATIVE;  [s/p lumpectomy; margins 3 mm].  T1b; grade 1.  S/p WBRT [s/p RT may 2nd 2023]. ? ?#Reviewed importance of discussed the importance of endocrine therapy to prevent development of metastatic breast cancer also prevent ipsilateral/contralateral breast cancer.  I again reviewed the mechanism of action of aromatase inhibitors-with blocking of estrogen to prevent breast cancer.  ? ?#I also discussed the potential side effects including but not limited to arthralgias hot flashes and increased risk of osteoporosis.  Patient is very anxious/reluctant with any endocrine therapy.  Patient states that she will need time to think and make a decision.  She will let us know at next visit. ? ?# Radiation dermatitis- G-1- improving.  ? ?# Patient has a personal history of osteoporosis [2022-BMD; [place]]; discussed regarding use of tamoxifen [hx of stroke].  ?  Side effects from Fosamax. ? ?# IgA lambda MGUS [since 2016]- OCT 2022- 1.1- kappa/lamda-N [[facility]]; CBC/CMP-N.  Monitor for now; repeat labs in 3 months. ? ?#Congestion of the left ear-positive for earwax.  Recommend evaluation with PCP/ENT. ? ?# COVID Vaccination: defer to Pharmacy.   ? ?# DISPOSITION:  ?# follow-up in 3 month-MD; labs- cbc/cmp; MM panel; K/L light chain ratio-Dr.B ? ?Cc; Dr.Hedrick-  ?

## 2022-01-12 NOTE — Progress Notes (Signed)
[place] ?OFFICE PROGRESS NOTE ? ?Patient Care Team: ?Maryland Pink, [name] ?Cammie Sickle, MD as Consulting Physician (Oncology) ?Noreene Filbert, MD as Consulting Physician (Radiation Oncology) ? ? ?SUMMARY OF ONCOLOGIC HISTORY: Breast cancer ? ? ? ?Oncology History Overview Note  ?# JAN 2023- # RIGHT breast [place] with lobular feathureds ER-POSITIVE/PR-NEG; her 2-2+IHC; FISH-PENIDNG; G-1. JAN 2023- MRI-MRI shows 1.8 cm right upper upper quadrant mass [s/p recent biopsy; Dr.Byrnett].DIAGNOSIS:  ?A. BREAST, RIGHT UPPER OUTER QUADRANT; EXCISION:  ?- INVASIVE LOBULAR CARCINOMA.  ?- CLIP AND BIOPSY SITE IDENTIFIED.  ?- SEE CANCER SUMMARY.  ? ?Comment:  ?Multiple additional deeper recut levels were examined, due to disruption  ?of tissue in the initial slides demonstrating tumor. Invasive carcinoma  ?measures up to 9 mm in greatest linear extent, although there is dense  ?fibrosis surrounding the invasive carcinoma, which may account for the  ?discrepancy between the microscopic measurement, and the radiographic  ?and gross measurements.  ? ?B. LYMPH NODES, RIGHT SENTINEL #1, #2, #3, AND #4; EXCISION:  ?- FOUR LYMPH NODES, NEGATIVE FOR MALIGNANCY (0/4).  ? ?Comment:  ?Immunohistochemical studies directed against cytokeratin AE1/AE3 are  ?performed on all 4 tissue blocks, and are negative for metastatic  ?carcinoma  ? ?C. BREAST, RIGHT, ADDITIONAL ANTERIOR/INFERIOR MARGIN; EXCISION:  ?- BENIGN MAMMARY PARENCHYMA WITH FIBROCYSTIC AND APOCRINE CHANGES,  ?COLUMNAR CELL CHANGE WITHOUT ATYPIA, USUAL DUCTAL HYPERPLASIA, AND FOCAL  ?SCLEROSING ADENOSIS.  ?- FOCAL CHANGES SUGGESTIVE OF BENIGN INTRADUCTAL PAPILLOMA.  ?- NEGATIVE FOR RESIDUAL INVASIVE LOBULAR CARCINOMA AND OTHER ATYPICAL  ?PROLIFERATIVE BREAST DISEASE.  ? ?CANCER CASE SUMMARY: INVASIVE CARCINOMA OF THE BREAST  ?Standard(s): AJCC-UICC 8  ? ?SPECIMEN  ?Procedure: Excision  ?Specimen Laterality: Right  ? ?TUMOR   ?Histologic Type: Invasive lobular carcinoma  ?Histologic Grade (Nottingham Histologic Score)  ?                     Glandular (Acinar)/Tubular Differentiation: 3  ?                     Nuclear Pleomorphism: 1  ?                     Mitotic Rate: 1  ?                     Overall Grade: Grade 1  ?Tumor Size: 9 mm  ?Tumor Focality: Single focus of invasive carcinoma  ?Tumor Extent: Not applicable  ?Ductal Carcinoma In Situ (DCIS): Not identified  ?Lymphatic and/or Vascular Invasion: Not identified  ?Treatment Effect in the Breast: No known presurgical therapy  ? ?MARGINS  ?Margin Status for Invasive Carcinoma: All margins negative for invasive  ?carcinoma  ?                     Distance from closest margin: 3 mm  ?                     Specify closest margin: Posterior/deep  ? ?Margin Status for DCIS: Not applicable (no DCIS in specimen)  ? ?#Stage I ER/PR positive HER2 negative breast cancer pT1b grade 1; no Oncotype done.  S/p lumpectomy ? ?#Dec 28, 2021-s/p radiation; May  17th, 2023-declined AI ? ? ? July 2016-M-protein-IgA Lamda- MGUS: NOV 2016- M spike- 1gm/dl; IgA- 1898 [total]; K/L=N; AUG 2016- Skeletal survey-Normal; ? ?# Stroke- ? [Dr.Shah; 2018] ?  ?Carcinoma of  upper-outer quadrant of right breast in female, estrogen receptor positive ([place])  ?10/01/2021 Initial Diagnosis  ? Carcinoma of upper-outer quadrant of right breast in female, estrogen receptor positive ([place]) ?  ?11/10/2021 Cancer Staging  ? Staging form: Breast, AJCC 8th Edition ?- Clinical: Stage IA (cT1b, cN0, cM0, G1, ER+, PR-, HER2-) - Signed by Cammie Sickle, MD on 11/10/2021 ?Histologic grading system: 3 grade system ? ?  ? ? ?INTERVAL HISTORY: Alone.  Ambulating independently. ? ?81 year old female patient with diagnosis of stage I breast cancer ER positive PR negative HER2 negative i s/p radiation is here for follow-up. ? ?Patient states to be healing well from radiation.  However complains of mild discomfort at site of site of  radiation. ? ?Review of Systems  ?Constitutional:  Negative for chills, diaphoresis, fever, malaise/fatigue and weight loss.  ?HENT:  Negative for nosebleeds and sore throat.   ?Eyes:  Negative for double vision.  ?Respiratory:  Negative for cough, hemoptysis, sputum production, shortness of breath and wheezing.   ?Cardiovascular:  Negative for chest pain, palpitations, orthopnea and leg swelling.  ?Gastrointestinal:  Negative for abdominal pain, blood in stool, constipation, diarrhea, heartburn, melena, nausea and vomiting.  ?Genitourinary:  Negative for dysuria, frequency and urgency.  ?Musculoskeletal:  Positive for back pain and joint pain.  ?Skin: Negative.  Negative for itching and rash.  ?Neurological:  Negative for dizziness, tingling, focal weakness, weakness and headaches.  ?Endo/Heme/Allergies:  Does not bruise/bleed easily.  ?Psychiatric/Behavioral:  Negative for depression. The patient is not nervous/anxious and does not have insomnia.   ? ? ?PAST MEDICAL HISTORY :  ?Past Medical History:  ?Diagnosis Date  ? Arthritis   ? Breast cancer ([place])   ? Bronchitis   ? Cancer [facility])   ? basal cell  ? Change in bowel habits   ? Colon polyps   ? Femur fracture, left ([place])   ? Hepatitis   ? as a child  ? Hyperlipidemia   ? Hypertension   ? controlled with medication;   ? Hypothyroidism   ? h/o  ? MGUS (monoclonal gammopathy of unknown significance)   ? Osteoporosis   ? hasn't been checked in a while; unsure how severe;   ? Postmenopausal   ? Rectal bleeding   ? Seasonal allergies   ? Sleep apnea   ? does not use cpap  ? Stroke [facility]) ??  ? pt unaware but was told she had stroke possibly after fall in 2016-has occ balance issues  ? Vitamin D deficiency   ? ? ?PAST SURGICAL HISTORY :   ?Past Surgical History:  ?Procedure Laterality Date  ? AXILLARY LYMPH NODE BIOPSY Right 11/01/2021  ? Procedure: AXILLARY LYMPH NODE BIOPSY;  Surgeon: Robert Bellow, MD;  Location: [facility] ORS;  Service: General;  Laterality: Right;  ?  BREAST BIOPSY Left 09/30/2008  ? Patient presented with acute swelling in the breast, aspiration culture negative.  Subsequent core biopsy showing fragments of cyst wall with granulation tissue, fibrosis and focal residual epithelial lining.  No malignancy.  ? BREAST BIOPSY Right 09/22/2021  ? ULTRASOUND-GUIDED BIOPSY: - INVASIVE MAMMARY CARCINOMA WITH LOBULAR FEATURES  ? BREAST CYST ASPIRATION    ? BREAST LUMPECTOMY WITH NEEDLE LOCALIZATION Right 11/01/2021  ? Procedure: BREAST LUMPECTOMY WITH NEEDLE LOCALIZATION;  Surgeon: Robert Bellow, MD;  Location: [facility] ORS;  Service: General;  Laterality: Right;  ? BREAST SURGERY Right 11/01/2021  ? wire loc w/ sn for lumpectomy  ? CATARACT EXTRACTION W/PHACO Left 03/02/2021  ?  Procedure: CATARACT EXTRACTION PHACO AND INTRAOCULAR LENS PLACEMENT (IOC) LEFT 6.40 00:35.2;  Surgeon: Birder Robson, MD;  Location: [place];  Service: Ophthalmology;  Laterality: Left;  ? CATARACT EXTRACTION W/PHACO Right 03/16/2021  ? Procedure: CATARACT EXTRACTION PHACO AND INTRAOCULAR LENS PLACEMENT ([place]) RIGHT;  Surgeon: Birder Robson, MD;  Location: [place];  Service: Ophthalmology;  Laterality: Right;  7.42 ?0:51.0  ? COLONOSCOPY WITH PROPOFOL N/A 02/05/2015  ? Procedure: COLONOSCOPY WITH PROPOFOL;  Surgeon: Manya Silvas, MD;  Location[facility] ENDOSCOPY;  Service: Endoscopy;  Laterality: N/A;  ? COLONOSCOPY WITH PROPOFOL N/A 04/08/2020  ? Procedure: COLONOSCOPY WITH PROPOFOL;  Surgeon: Toledo, Benay Pike, MD;  Location: [facility] ENDOSCOPY;  Service: Gastroenterology;  Laterality: N/A;  ? FEMUR FRACTURE SURGERY Left 04/18/2015  ? FRACTURE SURGERY    ? TONSILLECTOMY    ? ? ?FAMILY HISTORY :   ?Family History  ?Problem Relation Age of Onset  ? Breast cancer Cousin   ?     paternal  ? Kidney cancer Cousin   ?     maternal  ? Leukemia Cousin   ?     maternal  ? Stomach cancer Other   ?     uncle  ? Prostate cancer Other   ?     grandfather  ? Skin cancer Other   ?      maternal grandparents  ? ? ?SOCIAL HISTORY:   ?Social History  ? ?Tobacco Use  ? Smoking status: Never  ? Smokeless tobacco: Never  ?Vaping Use  ? Vaping Use: Never used  ?Substance Use Topics  ? Alcohol use: No

## 2022-01-12 NOTE — Progress Notes (Signed)
Patient reports that right breast still sore.  Received a good report from surgeon yesterday. ? ?Having new dizziness and has been referred to vascular by her surgeon.   ?

## 2022-01-26 ENCOUNTER — Ambulatory Visit
Admission: RE | Admit: 2022-01-26 | Discharge: 2022-01-26 | Disposition: A | Discharge status: Home / Self Care | Payer: Medicare HMO | Source: Ambulatory Visit | Attending: Radiation Oncology | Admitting: Radiation Oncology

## 2022-01-26 ENCOUNTER — Encounter: Payer: Self-pay | Admitting: Radiation Oncology

## 2022-01-26 VITALS — BP 144/64 | HR 58 | Temp 97.5°F | Resp 16 | Ht 62.0 in | Wt 166.2 lb

## 2022-01-26 DIAGNOSIS — Z170 Estrogen receptor positive status [ER+]: Secondary | ICD-10-CM | POA: Diagnosis not present

## 2022-01-26 DIAGNOSIS — C50411 Malignant neoplasm of upper-outer quadrant of right female breast: Secondary | ICD-10-CM | POA: Diagnosis not present

## 2022-01-26 NOTE — Progress Notes (Signed)
Radiation Oncology Follow up Note  Name: Kristin Wise   Date:   01/26/2022 MRN:  [id number] DOB: 1941/08/05    This 81 y.o. female presents to the clinic today for 1 month follow-up status post whole breast radiation to her right breast for invasive lobular carcinoma stage Ia.  REFERRING PROVIDER: Maryland Pink, MD  HPI: Patient is a 81 year old female now out 1 month having completed whole breast radiation to her right breast for stage Ia ER positive PR negative invasive lobular carcinoma.  Seen today in routine follow-up she still has some slight burning and pain in her right breast most consistent with scar tissue.  She specifically denies cough or bone pain.  She has been offered antiestrogen therapy although it is pondering that at this time.  COMPLICATIONS OF TREATMENT: none  FOLLOW UP COMPLIANCE: keeps appointments   PHYSICAL EXAM:  BP (!) 144/64 (BP Location: Left Arm, Patient Position: Sitting, Cuff Size: Normal)   Pulse (!) 58   Temp (!) 97.5 F (36.4 C)   Resp 16   Ht '5\' 2"'$  (1.575 m) Comment: stated HT.  Wt 166 lb 3.2 oz (75.4 kg)   BMI 30.40 kg/m  Lungs are clear to A&P cardiac examination essentially unremarkable with regular rate and rhythm. No dominant mass or nodularity is noted in either breast in 2 positions examined. Incision is well-healed. No axillary or supraclavicular adenopathy is appreciated. Cosmetic result is excellent.  Well-developed well-nourished patient in NAD. HEENT reveals PERLA, EOMI, discs not visualized.  Oral cavity is clear. No oral mucosal lesions are identified. Neck is clear without evidence of cervical or supraclavicular adenopathy. Lungs are clear to A&P. Cardiac examination is essentially unremarkable with regular rate and rhythm without murmur rub or thrill. Abdomen is benign with no organomegaly or masses noted. Motor sensory and DTR levels are equal and symmetric in the upper and lower extremities. Cranial nerves II through XII are  grossly intact. Proprioception is intact. No peripheral adenopathy or edema is identified. No motor or sensory levels are noted. Crude visual fields are within normal range.  RADIOLOGY RESULTS: No current films to review  PLAN: Present time patient is recovering well from her whole breast radiation.  And pleased with her overall progress.  I have asked to see her back in 4 to 5 months for follow-up.  Patient knows to call with any concerns.  I would like to take this opportunity to thank you for allowing me to participate in the care of your patient.Noreene Filbert, MD

## 2022-02-07 ENCOUNTER — Other Ambulatory Visit (INDEPENDENT_AMBULATORY_CARE_PROVIDER_SITE_OTHER): Payer: Self-pay | Admitting: Vascular Surgery

## 2022-02-07 DIAGNOSIS — R0989 Other specified symptoms and signs involving the circulatory and respiratory systems: Secondary | ICD-10-CM

## 2022-02-08 ENCOUNTER — Encounter (INDEPENDENT_AMBULATORY_CARE_PROVIDER_SITE_OTHER): Payer: Medicare HMO

## 2022-02-08 ENCOUNTER — Encounter (INDEPENDENT_AMBULATORY_CARE_PROVIDER_SITE_OTHER): Payer: Medicare HMO | Admitting: Vascular Surgery

## 2022-02-16 DIAGNOSIS — E559 Vitamin D deficiency, unspecified: Secondary | ICD-10-CM | POA: Diagnosis not present

## 2022-02-16 DIAGNOSIS — E785 Hyperlipidemia, unspecified: Secondary | ICD-10-CM | POA: Diagnosis not present

## 2022-02-16 DIAGNOSIS — R7303 Prediabetes: Secondary | ICD-10-CM | POA: Insufficient documentation

## 2022-02-16 DIAGNOSIS — I10 Essential (primary) hypertension: Secondary | ICD-10-CM | POA: Diagnosis not present

## 2022-02-16 DIAGNOSIS — C50919 Malignant neoplasm of unspecified site of unspecified female breast: Secondary | ICD-10-CM | POA: Diagnosis not present

## 2022-03-09 DIAGNOSIS — J301 Allergic rhinitis due to pollen: Secondary | ICD-10-CM | POA: Diagnosis not present

## 2022-03-09 DIAGNOSIS — H903 Sensorineural hearing loss, bilateral: Secondary | ICD-10-CM | POA: Diagnosis not present

## 2022-03-09 DIAGNOSIS — H6063 Unspecified chronic otitis externa, bilateral: Secondary | ICD-10-CM | POA: Diagnosis not present

## 2022-03-09 DIAGNOSIS — H6123 Impacted cerumen, bilateral: Secondary | ICD-10-CM | POA: Diagnosis not present

## 2022-03-11 DIAGNOSIS — H26492 Other secondary cataract, left eye: Secondary | ICD-10-CM | POA: Diagnosis not present

## 2022-03-20 DIAGNOSIS — I6529 Occlusion and stenosis of unspecified carotid artery: Secondary | ICD-10-CM | POA: Insufficient documentation

## 2022-03-20 NOTE — Progress Notes (Signed)
MRN : [id number]  Kristin Wise is a 81 y.o. (11/07/1940) female who presents with chief complaint of check carotid arteries.  History of Present Illness:   The patient is seen for evaluation of carotid stenosis. The concern has been raised after a left carotid bruit was noted.  The patient denies amaurosis fugax. There is no recent history of TIA symptoms or focal motor deficits. There is no prior documented CVA.  There is no history of migraine headaches. There is no history of seizures.  The patient is taking enteric-coated aspirin 81 mg daily.  No recent shortening of the patient's walking distance or new symptoms consistent with claudication.  No history of rest pain symptoms. No new ulcers or wounds of the lower extremities have occurred.  There is no history of DVT, PE or superficial thrombophlebitis. No recent episodes of angina or shortness of breath documented.    Carotid duplex is essentially normal  No outpatient medications have been marked as taking for the 03/21/22 encounter (Appointment) with Delana Meyer, Dolores Lory, MD.    Past Medical History:  Diagnosis Date   Arthritis    Breast cancer ([place])    Bronchitis    Cancer ([place])    basal cell   Change in bowel habits    Colon polyps    Femur fracture, left ([place])    Hepatitis    as a child   Hyperlipidemia    Hypertension    controlled with medication;    Hypothyroidism    h/o   MGUS (monoclonal gammopathy of unknown significance)    Osteoporosis    hasn't been checked in a while; unsure how severe;    Postmenopausal    Rectal bleeding    Seasonal allergies    Sleep apnea    does not use cpap   Stroke [facility]) ??   pt unaware but was told she had stroke possibly after fall in 2016-has occ balance issues   Vitamin D deficiency     Past Surgical History:  Procedure Laterality Date   AXILLARY LYMPH NODE BIOPSY Right 11/01/2021   Procedure: AXILLARY LYMPH NODE BIOPSY;  Surgeon: Robert Bellow,  MD;  Location: [facility] ORS;  Service: General;  Laterality: Right;   BREAST BIOPSY Left 09/30/2008   Patient presented with acute swelling in the breast, aspiration culture negative.  Subsequent core biopsy showing fragments of cyst wall with granulation tissue, fibrosis and focal residual epithelial lining.  No malignancy.   BREAST BIOPSY Right 09/22/2021   ULTRASOUND-GUIDED BIOPSY: - INVASIVE MAMMARY CARCINOMA WITH LOBULAR FEATURES   BREAST CYST ASPIRATION     BREAST LUMPECTOMY WITH NEEDLE LOCALIZATION Right 11/01/2021   Procedure: BREAST LUMPECTOMY WITH NEEDLE LOCALIZATION;  Surgeon: Robert Bellow, MD;  Location: [facility] ORS;  Service: General;  Laterality: Right;   BREAST SURGERY Right 11/01/2021   wire loc w/ sn for lumpectomy   CATARACT EXTRACTION W/PHACO Left 03/02/2021   Procedure: CATARACT EXTRACTION PHACO AND INTRAOCULAR LENS PLACEMENT (IOC) LEFT 6.40 00:35.2;  Surgeon: Birder Robson, MD;  Location: [place];  Service: Ophthalmology;  Laterality: Left;   CATARACT EXTRACTION W/PHACO Right 03/16/2021   Procedure: CATARACT EXTRACTION PHACO AND INTRAOCULAR LENS PLACEMENT ([place]) RIGHT;  Surgeon: Birder Robson, MD;  Location: [place];  Service: Ophthalmology;  Laterality: Right;  7.42 0:51.0   COLONOSCOPY WITH PROPOFOL N/A 02/05/2015   Procedure: COLONOSCOPY WITH PROPOFOL;  Surgeon: Manya Silvas, MD;  Location: [facility] ENDOSCOPY;  Service: Endoscopy;  Laterality: N/A;  COLONOSCOPY WITH PROPOFOL N/A 04/08/2020   Procedure: COLONOSCOPY WITH PROPOFOL;  Surgeon: Toledo, Benay Pike, MD;  Location: [facility] ENDOSCOPY;  Service: Gastroenterology;  Laterality: N/A;   FEMUR FRACTURE SURGERY Left 04/18/2015   FRACTURE SURGERY     TONSILLECTOMY      Social History Social History   Tobacco Use   Smoking status: Never   Smokeless tobacco: Never  Vaping Use   Vaping Use: Never used  Substance Use Topics   Alcohol use: No   Drug use: No    Family History Family  History  Problem Relation Age of Onset   Breast cancer Cousin        paternal   Kidney cancer Cousin        maternal   Leukemia Cousin        maternal   Stomach cancer Other        uncle   Prostate cancer Other        grandfather   Skin cancer Other        maternal grandparents    Allergies  Allergen Reactions   Mold Extract [Trichophyton] Other (See Comments)    Allergy to it.    Codeine     "Feels bad"   Indomethacin     "Bad dreams"   Other     Sodium Laurel Sulfate- caused gums to peel, corners of mouth became sore   Sulfa Antibiotics Hives    Feels terrible    Cashew Nut (Anacardium Occidentale) Skin Test Rash   Nickel Rash   Sulfamethoxazole-Trimethoprim Rash     REVIEW OF SYSTEMS (Negative unless checked)  Constitutional: '[]'$ Weight loss  '[]'$ Fever  '[]'$ Chills Cardiac: '[]'$ Chest pain   '[]'$ Chest pressure   '[]'$ Palpitations   '[]'$ Shortness of breath when laying flat   '[]'$ Shortness of breath with exertion. Vascular:  '[x]'$ Pain in legs with walking   '[]'$ Pain in legs at rest  '[]'$ History of DVT   '[]'$ Phlebitis   '[]'$ Swelling in legs   '[]'$ Varicose veins   '[]'$ Non-healing ulcers Pulmonary:   '[]'$ Uses home oxygen   '[]'$ Productive cough   '[]'$ Hemoptysis   '[]'$ Wheeze  '[]'$ COPD   '[]'$ Asthma Neurologic:  '[]'$ Dizziness   '[]'$ Seizures   '[]'$ History of stroke   '[]'$ History of TIA  '[]'$ Aphasia   '[]'$ Vissual changes   '[]'$ Weakness or numbness in arm   '[]'$ Weakness or numbness in leg Musculoskeletal:   '[]'$ Joint swelling   '[]'$ Joint pain   '[]'$ Low back pain Hematologic:  '[]'$ Easy bruising  '[]'$ Easy bleeding   '[]'$ Hypercoagulable state   '[]'$ Anemic Gastrointestinal:  '[]'$ Diarrhea   '[]'$ Vomiting  '[]'$ Gastroesophageal reflux/heartburn   '[]'$ Difficulty swallowing. Genitourinary:  '[]'$ Chronic kidney disease   '[]'$ Difficult urination  '[]'$ Frequent urination   '[]'$ Blood in urine Skin:  '[]'$ Rashes   '[]'$ Ulcers  Psychological:  '[]'$ History of anxiety   '[]'$  History of major depression.  Physical Examination  There were no vitals filed for this visit. There is no height or  weight on file to calculate BMI. Gen: WD/WN, NAD Head: [place]/AT, No temporalis wasting.  Ear/Nose/Throat: Hearing grossly intact, nares w/o erythema or drainage Eyes: PER, EOMI, sclera nonicteric.  Neck: Supple, no masses.  No bruit or JVD.  Pulmonary:  Good air movement, no audible wheezing, no use of accessory muscles.  Cardiac: RRR, normal S1, S2, no Murmurs. Vascular:  carotid bruit noted left Vessel Right Left  Radial Palpable Palpable  Carotid  Palpable  Palpable  Subclav  Palpable Palpable  Gastrointestinal: soft, non-distended. No guarding/no peritoneal signs.  Musculoskeletal: M/S 5/5 throughout.  No visible deformity.  Neurologic: CN 2-12 intact. Pain and light touch intact in extremities.  Symmetrical.  Speech is fluent. Motor exam as listed above. Psychiatric: Judgment intact, Mood & affect appropriate for pt's clinical situation. Dermatologic: No rashes or ulcers noted.  No changes consistent with cellulitis.   CBC Lab Results  Component Value Date   WBC 6.5 09/05/2018   HGB 10.9 (L) 09/05/2018   HCT 34.1 (L) 09/05/2018   MCV 89.3 09/05/2018   PLT 242 09/05/2018    BMET    Component Value Date/Time   NA 140 09/05/2018 0730   NA 141 09/29/2011 1424   K 4.1 10/26/2021 1355   K 3.9 09/29/2011 1424   CL 107 09/05/2018 0730   CL 100 09/29/2011 1424   CO2 25 09/05/2018 0730   CO2 25 09/29/2011 1424   GLUCOSE 125 (H) 09/05/2018 0730   GLUCOSE 172 (H) 09/29/2011 1424   BUN 23 09/05/2018 0730   BUN 19 (H) 09/29/2011 1424   CREATININE 0.69 09/05/2018 0730   CREATININE 0.79 09/29/2011 1424   CALCIUM 9.0 09/05/2018 0730   CALCIUM 10.1 09/29/2011 1424   GFRNONAA >60 09/05/2018 0730   GFRNONAA >60 09/29/2011 1424   GFRAA >60 09/05/2018 0730   GFRAA >60 09/29/2011 1424   CrCl cannot be calculated (Patient's most recent lab result is older than the maximum 21 days allowed.).  COAG No results found for: "INR", "PROTIME"  Radiology No results  found.   Assessment/Plan 1. Stenosis of carotid artery, unspecified laterality The patient remains asymptomatic with respect to a left carotid bruit.  Of note she has had XRT secondary to breast cancer treatment.  Patient should undergo CT angiography of the carotid arteries to define the degree of stenosis of the proximal common carotid artery on the left.  The risks, benefits and alternative therapies were reviewed in detail with the patient.  All questions were answered.  The patient agrees to proceed with imaging.  Continue antiplatelet therapy as prescribed. Continue management of CAD, HTN and Hyperlipidemia. Healthy heart diet, encouraged exercise at least 4 times per week.   - CT ANGIO NECK W OR WO CONTRAST; Future  2. Mixed hyperlipidemia Continue statin as ordered and reviewed, no changes at this time     Hortencia Pilar, MD  03/20/2022 2:00 PM

## 2022-03-21 ENCOUNTER — Ambulatory Visit (INDEPENDENT_AMBULATORY_CARE_PROVIDER_SITE_OTHER): Payer: Medicare HMO | Admitting: Vascular Surgery

## 2022-03-21 ENCOUNTER — Ambulatory Visit (INDEPENDENT_AMBULATORY_CARE_PROVIDER_SITE_OTHER): Payer: Medicare HMO

## 2022-03-21 ENCOUNTER — Encounter (INDEPENDENT_AMBULATORY_CARE_PROVIDER_SITE_OTHER): Payer: Self-pay | Admitting: Vascular Surgery

## 2022-03-21 VITALS — BP 155/81 | HR 67 | Resp 16 | Wt 164.0 lb

## 2022-03-21 DIAGNOSIS — E782 Mixed hyperlipidemia: Secondary | ICD-10-CM | POA: Diagnosis not present

## 2022-03-21 DIAGNOSIS — I6529 Occlusion and stenosis of unspecified carotid artery: Secondary | ICD-10-CM

## 2022-03-21 DIAGNOSIS — R0989 Other specified symptoms and signs involving the circulatory and respiratory systems: Secondary | ICD-10-CM

## 2022-03-30 ENCOUNTER — Ambulatory Visit
Admission: RE | Admit: 2022-03-30 | Discharge: 2022-03-30 | Disposition: A | Discharge status: Home / Self Care | Payer: Medicare HMO | Source: Ambulatory Visit | Attending: Vascular Surgery | Admitting: Vascular Surgery

## 2022-03-30 DIAGNOSIS — I771 Stricture of artery: Secondary | ICD-10-CM | POA: Diagnosis not present

## 2022-03-30 DIAGNOSIS — R0989 Other specified symptoms and signs involving the circulatory and respiratory systems: Secondary | ICD-10-CM | POA: Diagnosis not present

## 2022-03-30 DIAGNOSIS — M4312 Spondylolisthesis, cervical region: Secondary | ICD-10-CM | POA: Diagnosis not present

## 2022-03-30 DIAGNOSIS — M47812 Spondylosis without myelopathy or radiculopathy, cervical region: Secondary | ICD-10-CM | POA: Diagnosis not present

## 2022-03-30 DIAGNOSIS — I6529 Occlusion and stenosis of unspecified carotid artery: Secondary | ICD-10-CM | POA: Insufficient documentation

## 2022-03-30 LAB — POCT I-STAT CREATININE: Creatinine, Ser: 0.8 mg/dL (ref 0.44–1.00)

## 2022-03-30 MED ORDER — IOHEXOL 350 MG/ML SOLN
75.0000 mL | Freq: Once | INTRAVENOUS | Status: AC | PRN
  Administered 2022-03-30: 75 mL via INTRAVENOUS

## 2022-04-07 ENCOUNTER — Ambulatory Visit (INDEPENDENT_AMBULATORY_CARE_PROVIDER_SITE_OTHER): Payer: Medicare HMO | Admitting: Vascular Surgery

## 2022-04-07 ENCOUNTER — Encounter (INDEPENDENT_AMBULATORY_CARE_PROVIDER_SITE_OTHER): Payer: Self-pay | Admitting: Vascular Surgery

## 2022-04-07 VITALS — BP 144/78 | HR 69 | Resp 16 | Wt 161.6 lb

## 2022-04-07 DIAGNOSIS — M159 Polyosteoarthritis, unspecified: Secondary | ICD-10-CM | POA: Diagnosis not present

## 2022-04-07 DIAGNOSIS — E782 Mixed hyperlipidemia: Secondary | ICD-10-CM

## 2022-04-07 DIAGNOSIS — I6529 Occlusion and stenosis of unspecified carotid artery: Secondary | ICD-10-CM | POA: Diagnosis not present

## 2022-04-07 DIAGNOSIS — I10 Essential (primary) hypertension: Secondary | ICD-10-CM | POA: Diagnosis not present

## 2022-04-08 DIAGNOSIS — M1990 Unspecified osteoarthritis, unspecified site: Secondary | ICD-10-CM | POA: Insufficient documentation

## 2022-04-08 DIAGNOSIS — I10 Essential (primary) hypertension: Secondary | ICD-10-CM | POA: Insufficient documentation

## 2022-04-08 DIAGNOSIS — H26491 Other secondary cataract, right eye: Secondary | ICD-10-CM | POA: Diagnosis not present

## 2022-04-08 NOTE — Progress Notes (Signed)
MRN : [id number]  Kristin Wise is a 81 y.o. (02-13-1941) female who presents with chief complaint of check carotid arteries.  History of Present Illness:   The patient is seen for follow up evaluation of carotid stenosis status post CT angiogram. CT scan was done 03/30/2022. Patient reports that the test went well with no problems or complications.   The patient denies interval amaurosis fugax. There is no recent or interval TIA symptoms or focal motor deficits. There is no prior documented CVA.  The patient is taking enteric-coated aspirin 81 mg daily.  There is no history of migraine headaches. There is no history of seizures.  No recent shortening of the patient's walking distance or new symptoms consistent with claudication.  No history of rest pain symptoms. No new ulcers or wounds of the lower extremities have occurred.  There is no history of DVT, PE or superficial thrombophlebitis. No recent episodes of angina or shortness of breath documented.   CT angiogram is reviewed by me personally and shows no evidence of  stenosis throughout.    Current Meds  Medication Sig   Alpha-Lipoic Acid 600 MG TABS Take 600 mg by mouth daily.   aspirin 81 MG EC tablet Take 81 mg by mouth daily.   Biotin 5 MG TBDP Take 5 mg by mouth daily.   CALCIUM-VITAMIN D PO Take 1 tablet by mouth daily.   ibuprofen (ADVIL,MOTRIN) 200 MG tablet Take 400 mg by mouth every 8 (eight) hours as needed for moderate pain.   L-Lysine 500 MG TABS Take 1,000 mg by mouth 4 (four) times daily as needed (mouth sores).   losartan-hydrochlorothiazide (HYZAAR) 50-12.5 MG tablet Take 0.5 tablets by mouth every morning.   Lutein 10 MG TABS Take 10 mg by mouth daily.   rosuvastatin (CRESTOR) 5 MG tablet Take 5 mg by mouth every other day. MORNING   Vitamin D, Ergocalciferol, (DRISDOL) 1.25 MG (50000 UNIT) CAPS capsule Take 50,000 Units by mouth every Sunday.    Past Medical History:  Diagnosis Date    Arthritis    Breast cancer ([place])    Bronchitis    Cancer ([place])    basal cell   Change in bowel habits    Colon polyps    Femur fracture, left (H[facility])    Hepatitis    as a child   Hyperlipidemia    Hypertension    controlled with medication;    Hypothyroidism    h/o   MGUS (monoclonal gammopathy of unknown significance)    Osteoporosis    hasn't been checked in a while; unsure how severe;    Postmenopausal    Rectal bleeding    Seasonal allergies    Sleep apnea    does not use cpap   Stroke [facility]) ??   pt unaware but was told she had stroke possibly after fall in 2016-has occ balance issues   Vitamin D deficiency     Past Surgical History:  Procedure Laterality Date   AXILLARY LYMPH NODE BIOPSY Right 11/01/2021   Procedure: AXILLARY LYMPH NODE BIOPSY;  Surgeon: Robert Bellow, MD;  Location: [facility] ORS;  Service: General;  Laterality: Right;   BREAST BIOPSY Left 09/30/2008   Patient presented with acute swelling in the breast, aspiration culture negative.  Subsequent core biopsy showing fragments of cyst wall with granulation tissue, fibrosis and focal residual epithelial lining.  No malignancy.   BREAST BIOPSY Right 09/22/2021   ULTRASOUND-GUIDED BIOPSY: - INVASIVE MAMMARY CARCINOMA  WITH LOBULAR FEATURES   BREAST CYST ASPIRATION     BREAST LUMPECTOMY WITH NEEDLE LOCALIZATION Right 11/01/2021   Procedure: BREAST LUMPECTOMY WITH NEEDLE LOCALIZATION;  Surgeon: Robert Bellow, MD;  Location: [facility] OR[facility];  Service: General;  Laterality: Right;   BREAST SURGERY Right 11/01/2021   wire loc w/ sn for lumpectomy   CATARACT EXTRACTION W/PHACO Left 03/02/2021   Procedure: CATARACT EXTRACTION PHACO AND INTRAOCULAR LENS PLACEMENT (IOC) LEFT 6.40 00:35.2;  Surgeon: Birder Robson, MD;  Location: [place];  Service: Ophthalmology;  Laterality: Left;   CATARACT EXTRACTION W/PHACO Right 03/16/2021   Procedure: CATARACT EXTRACTION PHACO AND INTRAOCULAR LENS PLACEMENT ([place]) RIGHT;   Surgeon: Birder Robson, MD;  Location: [place];  Service: Ophthalmology;  Laterality: Right;  7.42 0:51.0   COLONOSCOPY WITH PROPOFOL N/A 02/05/2015   Procedure: COLONOSCOPY WITH PROPOFOL;  Surgeon: Manya Silvas, MD;  Location: [facility];  Service: Endoscopy;  Laterality: N/A;   COLONOSCOPY WITH PROPOFOL N/A 04/08/2020   Procedure: COLONOSCOPY WITH PROPOFOL;  Surgeon: Toledo, Benay Pike, MD;  Location: [facility] ENDOSCOPY;  Service: Gastroenterology;  Laterality: N/A;   FEMUR FRACTURE SURGERY Left 04/18/2015   FRACTURE SURGERY     TONSILLECTOMY      Social History Social History   Tobacco Use   Smoking status: Never   Smokeless tobacco: Never  Vaping Use   Vaping Use: Never used  Substance Use Topics   Alcohol use: No   Drug use: No    Family History Family History  Problem Relation Age of Onset   Breast cancer Cousin        paternal   Kidney cancer Cousin        maternal   Leukemia Cousin        maternal   Stomach cancer Other        uncle   Prostate cancer Other        grandfather   Skin cancer Other        maternal grandparents    Allergies  Allergen Reactions   Mold Extract [Trichophyton] Other (See Comments)    Allergy to it.    Codeine     "Feels bad"   Indomethacin     "Bad dreams"   Other     Sodium Laurel Sulfate- caused gums to peel, corners of mouth became sore   Sulfa Antibiotics Hives    Feels terrible    Cashew Nut (Anacardium Occidentale) Skin Test Rash   Nickel Rash   Sulfamethoxazole-Trimethoprim Rash     REVIEW OF SYSTEMS (Negative unless checked)  Constitutional: '[]'$ Weight loss  '[]'$ Fever  '[]'$ Chills Cardiac: '[]'$ Chest pain   '[]'$ Chest pressure   '[]'$ Palpitations   '[]'$ Shortness of breath when laying flat   '[]'$ Shortness of breath with exertion. Vascular:  '[x]'$ Pain in legs with walking   '[]'$ Pain in legs at rest  '[]'$ History of DVT   '[]'$ Phlebitis   '[]'$ Swelling in legs   '[]'$ Varicose veins   '[]'$ Non-healing ulcers Pulmonary:   '[]'$ Uses home  oxygen   '[]'$ Productive cough   '[]'$ Hemoptysis   '[]'$ Wheeze  '[]'$ COPD   '[]'$ Asthma Neurologic:  '[]'$ Dizziness   '[]'$ Seizures   '[]'$ History of stroke   '[]'$ History of TIA  '[]'$ Aphasia   '[]'$ Vissual changes   '[]'$ Weakness or numbness in arm   '[]'$ Weakness or numbness in leg Musculoskeletal:   '[]'$ Joint swelling   '[]'$ Joint pain   '[]'$ Low back pain Hematologic:  '[]'$ Easy bruising  '[]'$ Easy bleeding   '[]'$ Hypercoagulable state   '[]'$ Anemic Gastrointestinal:  '[]'$ Diarrhea   '[]'$ Vomiting  '[]'$   Gastroesophageal reflux/heartburn   '[]'$ Difficulty swallowing. Genitourinary:  '[]'$ Chronic kidney disease   '[]'$ Difficult urination  '[]'$ Frequent urination   '[]'$ Blood in urine Skin:  '[]'$ Rashes   '[]'$ Ulcers  Psychological:  '[]'$ History of anxiety   '[]'$  History of major depression.  Physical Examination  Vitals:   04/07/22 1355  BP: (!) 144/78  Pulse: 69  Resp: 16  Weight: 161 lb 9.6 oz (73.3 kg)   Body mass index is 29.56 kg/m. Gen: WD/WN, NAD Head: [place]/AT, No temporalis wasting.  Ear/Nose/Throat: Hearing grossly intact, nares w/o erythema or drainage Eyes: PER, EOMI, sclera nonicteric.  Neck: Supple, no masses.  No bruit or JVD.  Pulmonary:  Good air movement, no audible wheezing, no use of accessory muscles.  Cardiac: RRR, normal S1, S2, no Murmurs. Vascular:  no carotid bruit noted Vessel Right Left  Radial Palpable Palpable  Carotid  Palpable  Palpable  Subclav  Palpable Palpable  Gastrointestinal: soft, non-distended. No guarding/no peritoneal signs.  Musculoskeletal: M/S 5/5 throughout.  No visible deformity.  Neurologic: CN 2-12 intact. Pain and light touch intact in extremities.  Symmetrical.  Speech is fluent. Motor exam as listed above. Psychiatric: Judgment intact, Mood & affect appropriate for pt's clinical situation. Dermatologic: No rashes or ulcers noted.  No changes consistent with cellulitis.   CBC Lab Results  Component Value Date   WBC 6.5 09/05/2018   HGB 10.9 (L) 09/05/2018   HCT 34.1 (L) 09/05/2018   MCV 89.3 09/05/2018   PLT  242 09/05/2018    BMET    Component Value Date/Time   NA 140 09/05/2018 0730   NA 141 09/29/2011 1424   K 4.1 10/26/2021 1355   K 3.9 09/29/2011 1424   CL 107 09/05/2018 0730   CL 100 09/29/2011 1424   CO2 25 09/05/2018 0730   CO2 25 09/29/2011 1424   GLUCOSE 125 (H) 09/05/2018 0730   GLUCOSE 172 (H) 09/29/2011 1424   BUN 23 09/05/2018 0730   BUN 19 (H) 09/29/2011 1424   CREATININE 0.80 03/30/2022 1413   CREATININE 0.79 09/29/2011 1424   CALCIUM 9.0 09/05/2018 0730   CALCIUM 10.1 09/29/2011 1424   GFRNONAA >60 09/05/2018 0730   GFRNONAA >60 09/29/2011 1424   GFRAA >60 09/05/2018 0730   GFRAA >60 09/29/2011 1424   Estimated Creatinine Clearance: 51.7 mL/min (by C-G formula based on SCr of 0.8 mg/dL).  COAG No results found for: "INR", "PROTIME"  Radiology CT ANGIO NECK W OR WO CONTRAST  Result Date: 03/30/2022 CLINICAL DATA:  Provided history: Stenosis of carotid artery, unspecified laterality. Carotid artery stenosis; bruit with normal carotid duplex, ? Proximal stenosis; ? Radiation injury. EXAM: CT ANGIOGRAPHY NECK TECHNIQUE: Multidetector CT imaging of the neck was performed using the standard protocol during bolus administration of intravenous contrast. Multiplanar CT image reconstructions and MIPs were obtained to evaluate the vascular anatomy. Carotid stenosis measurements (when applicable) are obtained utilizing NASCET criteria, using the distal internal carotid diameter as the denominator. RADIATION DOSE REDUCTION: This exam was performed according to the departmental dose-optimization program which includes automated exposure control, adjustment of the mA and/or kV according to patient size and/or use of iterative reconstruction technique. CONTRAST:  108m OMNIPAQUE IOHEXOL 350 MG/ML SOLN COMPARISON:  None Available. FINDINGS: Aortic arch: Standard aortic branching. The origin of the innominate artery is excluded from the field of view. Streak and beam hardening artifact  arising from a dense right-sided contrast bolus partially obscures the right subclavian artery. Within this limitation, there is no appreciable hemodynamically significant innominate  or proximal subclavian artery stenosis. Mild atherosclerotic plaque within the proximal left subclavian artery. Right carotid system: CCA and ICA patent within the neck without stenosis or significant atherosclerotic disease. Left carotid system: CCA and ICA patent within the neck without stenosis or significant atherosclerotic disease. Tortuosity of the cervical ICA. Vertebral arteries: Vertebral arteries codominant and patent within neck without stenosis or significant atherosclerotic disease. Skeleton: Mild grade 1 anterolisthesis at C2-C3 and C3-C4. Cervical spondylosis. No acute fracture or aggressive osseous lesion. Other neck: No neck mass or cervical lymphadenopathy. Upper chest: No consolidation within the imaged lung apices. IMPRESSION: The common carotid, internal carotid and vertebral arteries are patent within the neck without stenosis or significant atherosclerotic disease. Mild atherosclerotic plaque within the proximal left subclavian artery. The innominate artery origin is excluded from the field of view. The right subclavian artery is partially obscured by streak and beam hardening artifact arising from a dense right-sided contrast bolus. Cervical spondylosis. Mild grade 1 anterolisthesis at C2-C3 and C3-C4. Electronically Signed   By: Kellie Simmering D.O.   On: 03/30/2022 15:00   VAS US CAROTID  Result Date: 03/28/2022 Carotid Arterial Duplex Study Patient Name:  Kristin Wise  Date of Exam:   03/21/2022 Medical Rec #: [id number]            Accession #:    [id number] Date of Birth: 03-04-1941           Patient Gender: F Patient Age:   9 years Exam Location:  [facility] Procedure:      VAS US CAROTID Referring Phys: Leotis Pain  --------------------------------------------------------------------------------  Indications: Left bruit. Performing Technologist: Blondell Reveal RT, RDMS, RVT  Examination Guidelines: A complete evaluation includes B-mode imaging, spectral Doppler, color Doppler, and power Doppler as needed of all accessible portions of each vessel. Bilateral testing is considered an integral part of a complete examination. Limited examinations for reoccurring indications may be performed as noted.  Right Carotid Findings: +----------+--------+--------+--------+------------------+--------+           PSV cm/sEDV cm/sStenosisPlaque DescriptionComments +----------+--------+--------+--------+------------------+--------+ CCA Prox  93      18                                         +----------+--------+--------+--------+------------------+--------+ CCA Mid   81      19                                         +----------+--------+--------+--------+------------------+--------+ CCA Distal82      17                                         +----------+--------+--------+--------+------------------+--------+ ICA Prox  79      19                                         +----------+--------+--------+--------+------------------+--------+ ICA Mid   85      26                                         +----------+--------+--------+--------+------------------+--------+  ICA Distal76      21                                         +----------+--------+--------+--------+------------------+--------+ ECA       83      12                                         +----------+--------+--------+--------+------------------+--------+ +----------+--------+-------+----------------+-------------------+           PSV cm/sEDV cmsDescribe        Arm Pressure (mmHG) +----------+--------+-------+----------------+-------------------+ Subclavian109            Multiphasic, WNL                     +----------+--------+-------+----------------+-------------------+ +---------+--------+--+--------+---------+ VertebralPSV cm/s45EDV cm/sAntegrade +---------+--------+--+--------+---------+  Left Carotid Findings: +----------+--------+--------+--------+------------------+--------+           PSV cm/sEDV cm/sStenosisPlaque DescriptionComments +----------+--------+--------+--------+------------------+--------+ CCA Prox  93      22                                         +----------+--------+--------+--------+------------------+--------+ CCA Mid   109     32                                         +----------+--------+--------+--------+------------------+--------+ CCA Distal92      25                                         +----------+--------+--------+--------+------------------+--------+ ICA Prox  79      25                                         +----------+--------+--------+--------+------------------+--------+ ICA Mid   65      23                                         +----------+--------+--------+--------+------------------+--------+ ICA Distal76      28                                         +----------+--------+--------+--------+------------------+--------+ ECA       87      6                                          +----------+--------+--------+--------+------------------+--------+ +----------+--------+--------+----------------+-------------------+           PSV cm/sEDV cm/sDescribe        Arm Pressure (mmHG) +----------+--------+--------+----------------+-------------------+ [id number]              Multiphasic, WNL                    +----------+--------+--------+----------------+-------------------+ +---------+--------+--+--------+---------+  VertebralPSV cm/s55EDV cm/sAntegrade +---------+--------+--+--------+---------+   Summary: Right Carotid: There is no evidence of stenosis in the right ICA. There was no                 evidence of thrombus, dissection, atherosclerotic plaque or                stenosis in the cervical carotid system. Left Carotid: There is no evidence of stenosis in the left ICA. There was no               evidence of thrombus, dissection, atherosclerotic plaque or               stenosis in the cervical carotid system.  *See table(s) above for measurements and observations.  Electronically signed by Hortencia Pilar MD on 03/28/2022 at 1:03:18 PM.    Final      Assessment/Plan 1. Stenosis of carotid artery, unspecified laterality Recommend:  Given the patient's asymptomatic subcritical stenosis no further invasive testing or surgery at this time.  Duplex ultrasound shows <20% stenosis bilaterally which has been unchanged when compared to the previous studies.  Continue antiplatelet therapy as prescribed Continue management of CAD, HTN and Hyperlipidemia Healthy heart diet,  encouraged exercise at least 4 times per week  Given the stable <20% bilateral carotid stenosis in association with the patient's age the patient will follow up PRN.  The patient is told that if symptoms of a TIA should occur then he should go to the ER and I should be notified, as this would change the management course.  The patient voices understanding.   2. Mixed hyperlipidemia Continue statin as ordered and reviewed, no changes at this time   3. Essential hypertension, benign Continue antihypertensive medications as already ordered, these medications have been reviewed and there are no changes at this time.   4. Primary osteoarthritis involving multiple joints Continue NSAID medications as already ordered, these medications have been reviewed and there are no changes at this time.  Continued activity and therapy was stressed.     Hortencia Pilar, MD  04/08/2022 1:29 PM

## 2022-04-12 ENCOUNTER — Telehealth: Payer: Self-pay | Admitting: *Deleted

## 2022-04-12 DIAGNOSIS — D472 Monoclonal gammopathy: Secondary | ICD-10-CM | POA: Diagnosis not present

## 2022-04-12 NOTE — Telephone Encounter (Signed)
Patient called asking about appts on 04/15/22, she states she gets her lbs drawn at [facility] and she already has request in hand, Her lab appointment has been cancelled at her request. She will be seeing Dr B for new breast cancer same day fyi.

## 2022-04-13 ENCOUNTER — Ambulatory Visit (LOCAL_COMMUNITY_HEALTH_CENTER): Payer: Medicare HMO

## 2022-04-13 DIAGNOSIS — Z719 Counseling, unspecified: Secondary | ICD-10-CM

## 2022-04-13 DIAGNOSIS — Z23 Encounter for immunization: Secondary | ICD-10-CM

## 2022-04-13 NOTE — Progress Notes (Signed)
Patient seen in nurse clinic with husband.  Tdap administered in left deltoid.  Tolerated well.  VIS.provided.  [facility] updated and copy provided to patient.

## 2022-04-15 ENCOUNTER — Inpatient Hospital Stay: Payer: Medicare HMO

## 2022-04-15 ENCOUNTER — Inpatient Hospital Stay: Payer: Medicare HMO | Attending: Internal Medicine | Admitting: Internal Medicine

## 2022-04-15 ENCOUNTER — Encounter: Payer: Self-pay | Admitting: Internal Medicine

## 2022-04-15 DIAGNOSIS — Z79899 Other long term (current) drug therapy: Secondary | ICD-10-CM | POA: Insufficient documentation

## 2022-04-15 DIAGNOSIS — Z8051 Family history of malignant neoplasm of kidney: Secondary | ICD-10-CM | POA: Diagnosis not present

## 2022-04-15 DIAGNOSIS — C50411 Malignant neoplasm of upper-outer quadrant of right female breast: Secondary | ICD-10-CM | POA: Insufficient documentation

## 2022-04-15 DIAGNOSIS — Z806 Family history of leukemia: Secondary | ICD-10-CM | POA: Insufficient documentation

## 2022-04-15 DIAGNOSIS — Z8042 Family history of malignant neoplasm of prostate: Secondary | ICD-10-CM | POA: Insufficient documentation

## 2022-04-15 DIAGNOSIS — Z808 Family history of malignant neoplasm of other organs or systems: Secondary | ICD-10-CM | POA: Diagnosis not present

## 2022-04-15 DIAGNOSIS — Z170 Estrogen receptor positive status [ER+]: Secondary | ICD-10-CM | POA: Diagnosis not present

## 2022-04-15 DIAGNOSIS — Z800 Family history of malignant neoplasm of digestive organs: Secondary | ICD-10-CM | POA: Diagnosis not present

## 2022-04-15 DIAGNOSIS — D472 Monoclonal gammopathy: Secondary | ICD-10-CM | POA: Insufficient documentation

## 2022-04-15 DIAGNOSIS — Z803 Family history of malignant neoplasm of breast: Secondary | ICD-10-CM | POA: Insufficient documentation

## 2022-04-15 NOTE — Progress Notes (Unsigned)
Is it ok for her to get a perm I her hair?

## 2022-04-15 NOTE — Progress Notes (Unsigned)
[facility] OFFICE PROGRESS NOTE  Patient Care Team: Hedrick, James, [name] Brahmanday, Govinda R, MD as Consulting Physician (Oncology) Chrystal, Glenn, MD as Consulting Physician (Radiation Oncology)   SUMMARY OF ONCOLOGIC HISTORY: Breast cancer    Oncology History Overview Note  # JAN 2023- # RIGHT breast IMC with lobular feathureds ER-POSITIVE/PR-NEG; her 2-2+IHC; FISH-PENIDNG; G-1. JAN 2023- MRI-MRI shows 1.8 cm right upper upper quadrant mass [s/p recent biopsy; Dr.Byrnett].DIAGNOSIS:  A. BREAST, RIGHT UPPER OUTER QUADRANT; EXCISION:  - INVASIVE LOBULAR CARCINOMA.  - CLIP AND BIOPSY SITE IDENTIFIED.  - SEE CANCER SUMMARY.   Comment:  Multiple additional deeper recut levels were examined, due to disruption  of tissue in the initial slides demonstrating tumor. Invasive carcinoma  measures up to 9 mm in greatest linear extent, although there is dense  fibrosis surrounding the invasive carcinoma, which may account for the  discrepancy between the microscopic measurement, and the radiographic  and gross measurements.   B. LYMPH NODES, RIGHT SENTINEL #1, #2, #3, AND #4; EXCISION:  - FOUR LYMPH NODES, NEGATIVE FOR MALIGNANCY (0/4).   Comment:  Immunohistochemical studies directed against cytokeratin AE1/AE3 are  performed on all 4 tissue blocks, and are negative for metastatic  carcinoma   C. BREAST, RIGHT, ADDITIONAL ANTERIOR/INFERIOR MARGIN; EXCISION:  - BENIGN MAMMARY PARENCHYMA WITH FIBROCYSTIC AND APOCRINE CHANGES,  COLUMNAR CELL CHANGE WITHOUT ATYPIA, USUAL DUCTAL HYPERPLASIA, AND FOCAL  SCLEROSING ADENOSIS.  - FOCAL CHANGES SUGGESTIVE OF BENIGN INTRADUCTAL PAPILLOMA.  - NEGATIVE FOR RESIDUAL INVASIVE LOBULAR CARCINOMA AND OTHER ATYPICAL  PROLIFERATIVE BREAST DISEASE.   CANCER CASE SUMMARY: INVASIVE CARCINOMA OF THE BREAST  Standard(s): AJCC-UICC 8   SPECIMEN  Procedure: Excision  Specimen Laterality: Right   TUMOR   Histologic Type: Invasive lobular carcinoma  Histologic Grade (Nottingham Histologic Score)                       Glandular (Acinar)/Tubular Differentiation: 3                       Nuclear Pleomorphism: 1                       Mitotic Rate: 1                       Overall Grade: Grade 1  Tumor Size: 9 mm  Tumor Focality: Single focus of invasive carcinoma  Tumor Extent: Not applicable  Ductal Carcinoma In Situ (DCIS): Not identified  Lymphatic and/or Vascular Invasion: Not identified  Treatment Effect in the Breast: No known presurgical therapy   MARGINS  Margin Status for Invasive Carcinoma: All margins negative for invasive  carcinoma                       Distance from closest margin: 3 mm                       Specify closest margin: Posterior/deep   Margin Status for DCIS: Not applicable (no DCIS in specimen)   #Stage I ER/PR positive HER2 negative breast cancer pT1b grade 1; no Oncotype done.  S/p lumpectomy  #Dec 28, 2021-s/p radiation; May  17th, 2023-declined AI    July 2016-M-protein-IgA Lamda- MGUS: NOV 2016- M spike- 1gm/dl; IgA- 1898 [total]; K/L=N; AUG 2016- Skeletal survey-Normal;  # Stroke- ? [Dr.Shah; 2018]   Carcinoma of   upper-outer quadrant of right breast in female, estrogen receptor positive (HCC)  10/01/2021 Initial Diagnosis   Carcinoma of upper-outer quadrant of right breast in female, estrogen receptor positive (HCC)   11/10/2021 Cancer Staging   Staging form: Breast, AJCC 8th Edition - Clinical: Stage IA (cT1b, cN0, cM0, G1, ER+, PR-, HER2-) - Signed by Brahmanday, Govinda R, MD on 11/10/2021 Histologic grading system: 3 grade system     INTERVAL HISTORY: Alone.  Ambulating independently.  81-year-old female patient with diagnosis of stage I breast cancer ER positive PR negative HER2 negative ion  here for follow-up.  Patient states to be healing well from radiation.  However complains of mild discomfort at site of site of radiation.  Review of  Systems  Constitutional:  Negative for chills, diaphoresis, fever, malaise/fatigue and weight loss.  HENT:  Negative for nosebleeds and sore throat.   Eyes:  Negative for double vision.  Respiratory:  Negative for cough, hemoptysis, sputum production, shortness of breath and wheezing.   Cardiovascular:  Negative for chest pain, palpitations, orthopnea and leg swelling.  Gastrointestinal:  Negative for abdominal pain, blood in stool, constipation, diarrhea, heartburn, melena, nausea and vomiting.  Genitourinary:  Negative for dysuria, frequency and urgency.  Musculoskeletal:  Positive for back pain and joint pain.  Skin: Negative.  Negative for itching and rash.  Neurological:  Negative for dizziness, tingling, focal weakness, weakness and headaches.  Endo/Heme/Allergies:  Does not bruise/bleed easily.  Psychiatric/Behavioral:  Negative for depression. The patient is not nervous/anxious and does not have insomnia.      PAST MEDICAL HISTORY :  Past Medical History:  Diagnosis Date   Arthritis    Breast cancer (HCC)    Bronchitis    Cancer (HCC)    basal cell   Change in bowel habits    Colon polyps    Femur fracture, left (HCC)    Hepatitis    as a child   Hyperlipidemia    Hypertension    controlled with medication;    Hypothyroidism    h/o   MGUS (monoclonal gammopathy of unknown significance)    Osteoporosis    hasn't been checked in a while; unsure how severe;    Postmenopausal    Rectal bleeding    Seasonal allergies    Sleep apnea    does not use cpap   Stroke (HCC) ??   pt unaware but was told she had stroke possibly after fall in 2016-has occ balance issues   Vitamin D deficiency     PAST SURGICAL HISTORY :   Past Surgical History:  Procedure Laterality Date   AXILLARY LYMPH NODE BIOPSY Right 11/01/2021   Procedure: AXILLARY LYMPH NODE BIOPSY;  Surgeon: Byrnett, Jeffrey W, MD;  Location: [facility];  Service: General;  Laterality: Right;   BREAST BIOPSY Left  09/30/2008   Patient presented with acute swelling in the breast, aspiration culture negative.  Subsequent core biopsy showing fragments of cyst wall with granulation tissue, fibrosis and focal residual epithelial lining.  No malignancy.   BREAST BIOPSY Right 09/22/2021   ULTRASOUND-GUIDED BIOPSY: - INVASIVE MAMMARY CARCINOMA WITH LOBULAR FEATURES   BREAST CYST ASPIRATION     BREAST LUMPECTOMY WITH NEEDLE LOCALIZATION Right 11/01/2021   Procedure: BREAST LUMPECTOMY WITH NEEDLE LOCALIZATION;  Surgeon: Byrnett, Jeffrey W, MD;  Location: [facility];  Service: General;  Laterality: Right;   BREAST SURGERY Right 11/01/2021   wire loc w/ sn for lumpectomy   CATARACT EXTRACTION W/PHACO Left 03/02/2021   Procedure: CATARACT   EXTRACTION PHACO AND INTRAOCULAR LENS PLACEMENT (IOC) LEFT 6.40 00:35.2;  Surgeon: Porfilio, William, MD;  Location: [facility];  Service: Ophthalmology;  Laterality: Left;   CATARACT EXTRACTION W/PHACO Right 03/16/2021   Procedure: CATARACT EXTRACTION PHACO AND INTRAOCULAR LENS PLACEMENT (IOC) RIGHT;  Surgeon: Porfilio, William, MD;  Location: [facility];  Service: Ophthalmology;  Laterality: Right;  7.42 0:51.0   COLONOSCOPY WITH PROPOFOL N/A 02/05/2015   Procedure: COLONOSCOPY WITH PROPOFOL;  Surgeon: Robert T Elliott, MD;  Location: [facility];  Service: Endoscopy;  Laterality: N/A;   COLONOSCOPY WITH PROPOFOL N/A 04/08/2020   Procedure: COLONOSCOPY WITH PROPOFOL;  Surgeon: Toledo, Teodoro K, MD;  Location: [facility];  Service: Gastroenterology;  Laterality: N/A;   FEMUR FRACTURE SURGERY Left 04/18/2015   FRACTURE SURGERY     TONSILLECTOMY      FAMILY HISTORY :   Family History  Problem Relation Age of Onset   Breast cancer Cousin        paternal   Kidney cancer Cousin        maternal   Leukemia Cousin        maternal   Stomach cancer Other        uncle   Prostate cancer Other        grandfather   Skin cancer Other        maternal  grandparents    SOCIAL HISTORY:   Social History   Tobacco Use   Smoking status: Never   Smokeless tobacco: Never  Vaping Use   Vaping Use: Never used  Substance Use Topics   Alcohol use: No   Drug use: No    ALLERGIES:  is allergic to mold extract [trichophyton], codeine, indomethacin, other, sulfa antibiotics, cashew nut (anacardium occidentale) skin test, nickel, and sulfamethoxazole-trimethoprim.  MEDICATIONS:  Current Outpatient Medications  Medication Sig Dispense Refill   Alpha-Lipoic Acid 600 MG TABS Take 600 mg by mouth daily.     aspirin 81 MG EC tablet Take 81 mg by mouth daily.     Biotin 5 MG TBDP Take 5 mg by mouth daily.     CALCIUM-VITAMIN D PO Take 1 tablet by mouth daily.     ibuprofen (ADVIL,MOTRIN) 200 MG tablet Take 400 mg by mouth every 8 (eight) hours as needed for moderate pain.     L-Lysine 500 MG TABS Take 1,000 mg by mouth 4 (four) times daily as needed (mouth sores).     losartan-hydrochlorothiazide (HYZAAR) 50-12.5 MG tablet Take 0.5 tablets by mouth every morning.     Lutein 10 MG TABS Take 10 mg by mouth daily.     rosuvastatin (CRESTOR) 5 MG tablet Take 5 mg by mouth every other day. MORNING     Vitamin D, Ergocalciferol, (DRISDOL) 1.25 MG (50000 UNIT) CAPS capsule Take 50,000 Units by mouth every Sunday.     No current facility-administered medications for this visit.    PHYSICAL EXAMINATION:   BP 131/60 (BP Location: Left Arm, Patient Position: Sitting, Cuff Size: Normal)   Pulse 66   Temp 99.6 F (37.6 C) (Tympanic)   Ht 5' 2" (1.575 m)   Wt 162 lb 3.2 oz (73.6 kg)   SpO2 97%   BMI 29.67 kg/m   Filed Weights   04/15/22 1509  Weight: 162 lb 3.2 oz (73.6 kg)    Physical Exam HENT:     Head: Normocephalic and atraumatic.     Mouth/Throat:     Pharynx: No oropharyngeal exudate.  Eyes:       Pupils: Pupils are equal, round, and reactive to light.  Cardiovascular:     Rate and Rhythm: Normal rate and regular rhythm.  Pulmonary:      Effort: No respiratory distress.     Breath sounds: No wheezing.  Abdominal:     General: Bowel sounds are normal. There is no distension.     Palpations: Abdomen is soft. There is no mass.     Tenderness: There is no abdominal tenderness. There is no guarding or rebound.  Musculoskeletal:        General: No tenderness. Normal range of motion.     Cervical back: Normal range of motion and neck supple.  Skin:    General: Skin is warm.  Neurological:     Mental Status: She is alert and oriented to person, place, and time.  Psychiatric:        Mood and Affect: Affect normal.      LABORATORY DATA:  I have reviewed the data as listed    Component Value Date/Time   NA 140 09/05/2018 0730   NA 141 09/29/2011 1424   K 4.1 10/26/2021 1355   K 3.9 09/29/2011 1424   CL 107 09/05/2018 0730   CL 100 09/29/2011 1424   CO2 25 09/05/2018 0730   CO2 25 09/29/2011 1424   GLUCOSE 125 (H) 09/05/2018 0730   GLUCOSE 172 (H) 09/29/2011 1424   BUN 23 09/05/2018 0730   BUN 19 (H) 09/29/2011 1424   CREATININE 0.80 03/30/2022 1413   CREATININE 0.79 09/29/2011 1424   CALCIUM 9.0 09/05/2018 0730   CALCIUM 10.1 09/29/2011 1424   PROT 7.3 09/05/2018 0730   PROT 8.5 (H) 09/29/2011 1424   ALBUMIN 3.5 09/05/2018 0730   ALBUMIN 4.1 09/29/2011 1424   AST 30 09/05/2018 0730   AST 24 09/29/2011 1424   ALT 22 09/05/2018 0730   ALT 12 09/29/2011 1424   ALKPHOS 83 09/05/2018 0730   ALKPHOS 76 09/29/2011 1424   BILITOT 0.8 09/05/2018 0730   BILITOT 0.8 09/29/2011 1424   GFRNONAA >60 09/05/2018 0730   GFRNONAA >60 09/29/2011 1424   GFRAA >60 09/05/2018 0730   GFRAA >60 09/29/2011 1424    No results found for: "SPEP", "UPEP"  Lab Results  Component Value Date   WBC 6.5 09/05/2018   NEUTROABS 4.2 09/05/2018   HGB 10.9 (L) 09/05/2018   HCT 34.1 (L) 09/05/2018   MCV 89.3 09/05/2018   PLT 242 09/05/2018      Chemistry      Component Value Date/Time   NA 140 09/05/2018 0730   NA 141  09/29/2011 1424   K 4.1 10/26/2021 1355   K 3.9 09/29/2011 1424   CL 107 09/05/2018 0730   CL 100 09/29/2011 1424   CO2 25 09/05/2018 0730   CO2 25 09/29/2011 1424   BUN 23 09/05/2018 0730   BUN 19 (H) 09/29/2011 1424   CREATININE 0.80 03/30/2022 1413   CREATININE 0.79 09/29/2011 1424      Component Value Date/Time   CALCIUM 9.0 09/05/2018 0730   CALCIUM 10.1 09/29/2011 1424   ALKPHOS 83 09/05/2018 0730   ALKPHOS 76 09/29/2011 1424   AST 30 09/05/2018 0730   AST 24 09/29/2011 1424   ALT 22 09/05/2018 0730   ALT 12 09/29/2011 1424   BILITOT 0.8 09/05/2018 0730   BILITOT 0.8 09/29/2011 1424        ASSESSMENT & PLAN:   Carcinoma of upper-outer quadrant of right breast in female, estrogen receptor positive (  HCC) # RIGHT INVASIVE LOBULAR CANCER- ER-POSITIVE/PR-NEG; her 2-NEGATIVE;  [s/p lumpectomy; margins 3 mm].  T1b; grade 1.  S/p WBRT [s/p RT may 2nd 2023].  #Reviewed importance of discussed the importance of endocrine therapy to prevent development of metastatic breast cancer also prevent ipsilateral/contralateral breast cancer.  I again reviewed the mechanism of action of aromatase inhibitors-with blocking of estrogen to prevent breast cancer.   #I also discussed the potential side effects including but not limited to arthralgias hot flashes and increased risk of osteoporosis.  Patient is very anxious/reluctant with any endocrine therapy.  Patient states that she will need time to think and make a decision.  She will let us know at next visit.  # Radiation dermatitis- G-1- improving.   # Patient has a personal history of osteoporosis [2022-BMD; [place]]; discussed regarding use of tamoxifen [hx of stroke].  ?  Side effects from Fosamax.  # IgA lambda MGUS [since 2016]- OCT 2022- 1.1- kappa/lamda-N [[facility]]; CBC/CMP-N.  Monitor for now; repeat labs in 3 months.  #Congestion of the left ear-positive for earwax.  Recommend evaluation with PCP/ENT.  # COVID Vaccination: defer to  Pharmacy.    # DISPOSITION:  # follow-up in 3 month-MD; labs- cbc/cmp; MM panel; K/L light chain ratio-Dr.B  Cc; Dr.Hedrick-     Govinda R Brahmanday, MD 04/15/2022 3:18 PM   

## 2022-04-15 NOTE — Assessment & Plan Note (Addendum)
#   RIGHT INVASIVE LOBULAR CANCER- ER-POSITIVE/PR-NEG; her 2-NEGATIVE;  [s/p lumpectomy; margins 3 mm].  T1b; grade 1.  S/p WBRT [s/p RT may 2nd 2023].  #Reviewed importance of discussed the importance of endocrine therapy to prevent development of metastatic breast cancer also prevent ipsilateral/contralateral breast cancer.  I again reviewed the mechanism of action of aromatase inhibitors-with blocking of estrogen to prevent breast cancer.   #I also discussed the potential side effects including but not limited to arthralgias hot flashes and increased risk of osteoporosis.  Patient is very anxious/reluctant with any endocrine therapy.  Patient states that she will need time to think and make a decision.  She will let us know at next visit.  # Radiation dermatitis- G-1- improving.   # Patient has a personal history of osteoporosis [2022-BMD; [place]]; discussed regarding use of tamoxifen [hx of stroke].  ?  Side effects from Fosamax.  # IgA lambda MGUS [since 2016]- OCT 2022- 1.1- kappa/lamda-N [[facility]]; CBC/CMP-N.  Monitor for now; repeat labs in 3 months.  #Congestion of the left ear-positive for earwax.  Recommend evaluation with PCP/ENT.  # COVID Vaccination: defer to Pharmacy.    # DISPOSITION:  # follow-up in 6 month-MD;[facility]- 1 weeks prior- labs- cbc/cmp; MM panel; K/L light chain ratio-Dr.B  Cc; Dr.Hedrick-

## 2022-04-18 ENCOUNTER — Encounter: Payer: Self-pay | Admitting: Internal Medicine

## 2022-06-01 ENCOUNTER — Telehealth: Payer: Self-pay | Admitting: Radiation Oncology

## 2022-06-01 NOTE — Telephone Encounter (Signed)
patient called and needs to reschedule her appointment [phone]. Thank you

## 2022-06-15 ENCOUNTER — Telehealth: Payer: Self-pay | Admitting: Radiation Oncology

## 2022-06-15 ENCOUNTER — Telehealth: Payer: Self-pay | Admitting: *Deleted

## 2022-06-15 NOTE — Telephone Encounter (Signed)
Spoke with patient, appointment changed.

## 2022-06-15 NOTE — Telephone Encounter (Signed)
Patient called to reschedule Nov. 2 appointment and would like a call back- [phone]

## 2022-06-27 ENCOUNTER — Encounter (INDEPENDENT_AMBULATORY_CARE_PROVIDER_SITE_OTHER): Payer: Self-pay

## 2022-06-30 ENCOUNTER — Ambulatory Visit: Payer: Medicare HMO | Admitting: Radiation Oncology

## 2022-07-01 DIAGNOSIS — E559 Vitamin D deficiency, unspecified: Secondary | ICD-10-CM | POA: Diagnosis not present

## 2022-07-01 DIAGNOSIS — I10 Essential (primary) hypertension: Secondary | ICD-10-CM | POA: Diagnosis not present

## 2022-07-01 DIAGNOSIS — R7303 Prediabetes: Secondary | ICD-10-CM | POA: Diagnosis not present

## 2022-07-01 DIAGNOSIS — E785 Hyperlipidemia, unspecified: Secondary | ICD-10-CM | POA: Diagnosis not present

## 2022-07-04 ENCOUNTER — Ambulatory Visit: Payer: Medicare HMO | Admitting: Internal Medicine

## 2022-07-06 DIAGNOSIS — R7303 Prediabetes: Secondary | ICD-10-CM | POA: Diagnosis not present

## 2022-07-06 DIAGNOSIS — R5381 Other malaise: Secondary | ICD-10-CM | POA: Diagnosis not present

## 2022-07-06 DIAGNOSIS — R5383 Other fatigue: Secondary | ICD-10-CM | POA: Diagnosis not present

## 2022-07-06 DIAGNOSIS — I10 Essential (primary) hypertension: Secondary | ICD-10-CM | POA: Diagnosis not present

## 2022-07-06 DIAGNOSIS — R42 Dizziness and giddiness: Secondary | ICD-10-CM | POA: Diagnosis not present

## 2022-07-06 DIAGNOSIS — E559 Vitamin D deficiency, unspecified: Secondary | ICD-10-CM | POA: Diagnosis not present

## 2022-07-06 DIAGNOSIS — Z1331 Encounter for screening for depression: Secondary | ICD-10-CM | POA: Diagnosis not present

## 2022-07-06 DIAGNOSIS — E785 Hyperlipidemia, unspecified: Secondary | ICD-10-CM | POA: Diagnosis not present

## 2022-07-06 DIAGNOSIS — Z0000 Encounter for general adult medical examination without abnormal findings: Secondary | ICD-10-CM | POA: Diagnosis not present

## 2022-07-18 DIAGNOSIS — R42 Dizziness and giddiness: Secondary | ICD-10-CM | POA: Diagnosis not present

## 2022-07-18 DIAGNOSIS — R2681 Unsteadiness on feet: Secondary | ICD-10-CM | POA: Diagnosis not present

## 2022-07-19 DIAGNOSIS — Z170 Estrogen receptor positive status [ER+]: Secondary | ICD-10-CM | POA: Diagnosis not present

## 2022-07-19 DIAGNOSIS — C50211 Malignant neoplasm of upper-inner quadrant of right female breast: Secondary | ICD-10-CM | POA: Diagnosis not present

## 2022-07-20 DIAGNOSIS — R2681 Unsteadiness on feet: Secondary | ICD-10-CM | POA: Diagnosis not present

## 2022-07-20 DIAGNOSIS — R42 Dizziness and giddiness: Secondary | ICD-10-CM | POA: Diagnosis not present

## 2022-07-27 DIAGNOSIS — R2681 Unsteadiness on feet: Secondary | ICD-10-CM | POA: Diagnosis not present

## 2022-07-27 DIAGNOSIS — R42 Dizziness and giddiness: Secondary | ICD-10-CM | POA: Diagnosis not present

## 2022-07-29 DIAGNOSIS — R2681 Unsteadiness on feet: Secondary | ICD-10-CM | POA: Diagnosis not present

## 2022-07-29 DIAGNOSIS — R42 Dizziness and giddiness: Secondary | ICD-10-CM | POA: Diagnosis not present

## 2022-08-01 DIAGNOSIS — R2681 Unsteadiness on feet: Secondary | ICD-10-CM | POA: Diagnosis not present

## 2022-08-01 DIAGNOSIS — R42 Dizziness and giddiness: Secondary | ICD-10-CM | POA: Diagnosis not present

## 2022-08-03 DIAGNOSIS — R42 Dizziness and giddiness: Secondary | ICD-10-CM | POA: Diagnosis not present

## 2022-08-03 DIAGNOSIS — R2681 Unsteadiness on feet: Secondary | ICD-10-CM | POA: Diagnosis not present

## 2022-08-09 DIAGNOSIS — R2681 Unsteadiness on feet: Secondary | ICD-10-CM | POA: Diagnosis not present

## 2022-08-09 DIAGNOSIS — R42 Dizziness and giddiness: Secondary | ICD-10-CM | POA: Diagnosis not present

## 2022-08-11 DIAGNOSIS — Z1211 Encounter for screening for malignant neoplasm of colon: Secondary | ICD-10-CM | POA: Diagnosis not present

## 2022-08-11 DIAGNOSIS — M3501 Sicca syndrome with keratoconjunctivitis: Secondary | ICD-10-CM | POA: Diagnosis not present

## 2022-08-12 DIAGNOSIS — R2681 Unsteadiness on feet: Secondary | ICD-10-CM | POA: Diagnosis not present

## 2022-08-12 DIAGNOSIS — R42 Dizziness and giddiness: Secondary | ICD-10-CM | POA: Diagnosis not present

## 2022-08-15 DIAGNOSIS — M810 Age-related osteoporosis without current pathological fracture: Secondary | ICD-10-CM | POA: Diagnosis not present

## 2022-08-16 DIAGNOSIS — R2681 Unsteadiness on feet: Secondary | ICD-10-CM | POA: Diagnosis not present

## 2022-08-16 DIAGNOSIS — R42 Dizziness and giddiness: Secondary | ICD-10-CM | POA: Diagnosis not present

## 2022-08-18 DIAGNOSIS — R42 Dizziness and giddiness: Secondary | ICD-10-CM | POA: Diagnosis not present

## 2022-08-18 DIAGNOSIS — R2681 Unsteadiness on feet: Secondary | ICD-10-CM | POA: Diagnosis not present

## 2022-08-18 LAB — COLOGUARD: COLOGUARD: POSITIVE — AB

## 2022-08-30 DIAGNOSIS — R2681 Unsteadiness on feet: Secondary | ICD-10-CM | POA: Diagnosis not present

## 2022-08-30 DIAGNOSIS — R42 Dizziness and giddiness: Secondary | ICD-10-CM | POA: Diagnosis not present

## 2022-09-01 ENCOUNTER — Ambulatory Visit
Admission: RE | Admit: 2022-09-01 | Discharge: 2022-09-01 | Disposition: A | Discharge status: Home / Self Care | Payer: Medicare HMO | Source: Ambulatory Visit | Attending: Radiation Oncology | Admitting: Radiation Oncology

## 2022-09-01 ENCOUNTER — Other Ambulatory Visit: Payer: Self-pay | Admitting: Radiation Oncology

## 2022-09-01 ENCOUNTER — Other Ambulatory Visit: Payer: Self-pay

## 2022-09-01 ENCOUNTER — Telehealth: Payer: Self-pay

## 2022-09-01 VITALS — BP 156/86 | HR 68 | Temp 98.5°F | Resp 17 | Wt 157.0 lb

## 2022-09-01 DIAGNOSIS — R2681 Unsteadiness on feet: Secondary | ICD-10-CM | POA: Diagnosis not present

## 2022-09-01 DIAGNOSIS — H9201 Otalgia, right ear: Secondary | ICD-10-CM | POA: Diagnosis not present

## 2022-09-01 DIAGNOSIS — Z923 Personal history of irradiation: Secondary | ICD-10-CM | POA: Insufficient documentation

## 2022-09-01 DIAGNOSIS — R42 Dizziness and giddiness: Secondary | ICD-10-CM | POA: Diagnosis not present

## 2022-09-01 DIAGNOSIS — C50411 Malignant neoplasm of upper-outer quadrant of right female breast: Secondary | ICD-10-CM | POA: Diagnosis not present

## 2022-09-01 DIAGNOSIS — Z170 Estrogen receptor positive status [ER+]: Secondary | ICD-10-CM | POA: Diagnosis not present

## 2022-09-01 NOTE — Progress Notes (Signed)
Radiation Oncology Follow up Note  Name: Kristin Wise   Date:   09/01/2022 MRN:  [id number] DOB: 12/10/1940    This 82 y.o. female presents to the clinic today for 21-monthfollow-up status post.  Whole breast radiation to her right breast for stage Ia ER positive invasive lobular carcinoma  REFERRING PROVIDER: HMaryland Pink MD  HPI: Patient is a 82year old female now out 6 months having completed whole breast radiation to her right breast for stage Ia ER positive PR negative invasive lobular carcinoma seen today in routine follow-up she is doing fairly well she still complains of some numbness in her right axilla which is expected postsurgical.  She occasionally does have some pains in her right breast.  She has not yet had mammograms..  She is currently not on endocrine therapy.  She is complaining of some right ear pain.  COMPLICATIONS OF TREATMENT: none  FOLLOW UP COMPLIANCE: keeps appointments   PHYSICAL EXAM:  BP (!) 156/86 (Patient Position: Sitting)   Pulse 68   Temp 98.5 F (36.9 C)   Resp 17   Wt 157 lb (71.2 kg)   SpO2 98%   BMI 28.72 kg/m  Lungs are clear to A&P cardiac examination essentially unremarkable with regular rate and rhythm. No dominant mass or nodularity is noted in either breast in 2 positions examined. Incision is well-healed. No axillary or supraclavicular adenopathy is appreciated. Cosmetic result is excellent.  Well-developed well-nourished patient in NAD. HEENT reveals PERLA, EOMI, discs not visualized.  Oral cavity is clear. No oral mucosal lesions are identified. Neck is clear without evidence of cervical or supraclavicular adenopathy. Lungs are clear to A&P. Cardiac examination is essentially unremarkable with regular rate and rhythm without murmur rub or thrill. Abdomen is benign with no organomegaly or masses noted. Motor sensory and DTR levels are equal and symmetric in the upper and lower extremities. Cranial nerves II through XII are grossly  intact. Proprioception is intact. No peripheral adenopathy or edema is identified. No motor or sensory levels are noted. Crude visual fields are within normal range.  RADIOLOGY RESULTS: Mammograms have been ordered prior to her next follow-up visit  PLAN: Present time patient is doing fairly well from a breast standpoint.  Have ordered diagnostic mammograms prior to her next follow-up visit with me in 6 months.  I have asked her to contact Dr. VDuffy BruceENT doctor for a right ear examination.  He is seen in the past for this problem.  Otherwise she knows to call with any concerns prior to her next visit.  Also establishing her care with Dr. BJacinto Reap  I would like to take this opportunity to thank you for allowing me to participate in the care of your patient..Noreene Filbert MD

## 2022-09-01 NOTE — Telephone Encounter (Signed)
Left voicemail mammogram scheduled Wed 6/19 at 140p at [facility]

## 2022-10-14 ENCOUNTER — Inpatient Hospital Stay: Payer: Medicare HMO | Admitting: Internal Medicine

## 2022-10-14 NOTE — Progress Notes (Deleted)
[facility] OFFICE PROGRESS NOTE  Patient Care Team: Hedrick, James, [name] Kato Wieczorek R, MD as Consulting Physician (Oncology) Chrystal, Glenn, MD as Consulting Physician (Radiation Oncology)   SUMMARY OF ONCOLOGIC HISTORY: Breast cancer    Oncology History Overview Note  # JAN 2023- # RIGHT breast IMC with lobular feathureds ER-POSITIVE/PR-NEG; her 2-2+IHC; FISH-PENIDNG; G-1. JAN 2023- MRI-MRI shows 1.8 cm right upper upper quadrant mass [s/p recent biopsy; Dr.Byrnett].DIAGNOSIS:  A. BREAST, RIGHT UPPER OUTER QUADRANT; EXCISION:  - INVASIVE LOBULAR CARCINOMA.  - CLIP AND BIOPSY SITE IDENTIFIED.  - SEE CANCER SUMMARY.   Comment:  Multiple additional deeper recut levels were examined, due to disruption  of tissue in the initial slides demonstrating tumor. Invasive carcinoma  measures up to 9 mm in greatest linear extent, although there is dense  fibrosis surrounding the invasive carcinoma, which may account for the  discrepancy between the microscopic measurement, and the radiographic  and gross measurements.   B. LYMPH NODES, RIGHT SENTINEL #1, #2, #3, AND #4; EXCISION:  - FOUR LYMPH NODES, NEGATIVE FOR MALIGNANCY (0/4).   Comment:  Immunohistochemical studies directed against cytokeratin AE1/AE3 are  performed on all 4 tissue blocks, and are negative for metastatic  carcinoma   C. BREAST, RIGHT, ADDITIONAL ANTERIOR/INFERIOR MARGIN; EXCISION:  - BENIGN MAMMARY PARENCHYMA WITH FIBROCYSTIC AND APOCRINE CHANGES,  COLUMNAR CELL CHANGE WITHOUT ATYPIA, USUAL DUCTAL HYPERPLASIA, AND FOCAL  SCLEROSING ADENOSIS.  - FOCAL CHANGES SUGGESTIVE OF BENIGN INTRADUCTAL PAPILLOMA.  - NEGATIVE FOR RESIDUAL INVASIVE LOBULAR CARCINOMA AND OTHER ATYPICAL  PROLIFERATIVE BREAST DISEASE.   CANCER CASE SUMMARY: INVASIVE CARCINOMA OF THE BREAST  Standard(s): AJCC-UICC 8   SPECIMEN  Procedure: Excision  Specimen Laterality: Right   TUMOR   Histologic Type: Invasive lobular carcinoma  Histologic Grade (Nottingham Histologic Score)                       Glandular (Acinar)/Tubular Differentiation: 3                       Nuclear Pleomorphism: 1                       Mitotic Rate: 1                       Overall Grade: Grade 1  Tumor Size: 9 mm  Tumor Focality: Single focus of invasive carcinoma  Tumor Extent: Not applicable  Ductal Carcinoma In Situ (DCIS): Not identified  Lymphatic and/or Vascular Invasion: Not identified  Treatment Effect in the Breast: No known presurgical therapy   MARGINS  Margin Status for Invasive Carcinoma: All margins negative for invasive  carcinoma                       Distance from closest margin: 3 mm                       Specify closest margin: Posterior/deep   Margin Status for DCIS: Not applicable (no DCIS in specimen)   #Stage I ER/PR positive HER2 negative breast cancer pT1b grade 1; no Oncotype done.  S/p lumpectomy  #Dec 28, 2021-s/p radiation; May  17th, 2023-declined AI    July 2016-M-protein-IgA Lamda- MGUS: NOV 2016- M spike- 1gm/dl; IgA- 1898 [total]; K/L=N; AUG 2016- Skeletal survey-Normal;  # Stroke- ? [Dr.Shah; 2018]   Carcinoma of   upper-outer quadrant of right breast in female, estrogen receptor positive ([place])  10/01/2021 Initial Diagnosis   Carcinoma of upper-outer quadrant of right breast in female, estrogen receptor positive ([place])   11/10/2021 Cancer Staging   Staging form: Breast, AJCC 8th Edition - Clinical: Stage IA (cT1b, cN0, cM0, G1, ER+, PR-, HER2-) - Signed by Cammie Sickle, MD on 11/10/2021 Histologic grading system: 3 grade system     INTERVAL HISTORY: Alone.  Ambulating independently.  82 year old female patient with diagnosis of stage I breast cancer ER positive PR negative HER2 negative ion  here for follow-up.  Denies any worsening reaction radiation.  Denies any new shortness of breath or cough.  Complains of mild to moderate fatigue from  radiation.  Review of Systems  Constitutional:  Negative for chills, diaphoresis, fever, malaise/fatigue and weight loss.  HENT:  Negative for nosebleeds and sore throat.   Eyes:  Negative for double vision.  Respiratory:  Negative for cough, hemoptysis, sputum production, shortness of breath and wheezing.   Cardiovascular:  Negative for chest pain, palpitations, orthopnea and leg swelling.  Gastrointestinal:  Negative for abdominal pain, blood in stool, constipation, diarrhea, heartburn, melena, nausea and vomiting.  Genitourinary:  Negative for dysuria, frequency and urgency.  Musculoskeletal:  Positive for back pain and joint pain.  Skin: Negative.  Negative for itching and rash.  Neurological:  Negative for dizziness, tingling, focal weakness, weakness and headaches.  Endo/Heme/Allergies:  Does not bruise/bleed easily.  Psychiatric/Behavioral:  Negative for depression. The patient is not nervous/anxious and does not have insomnia.      PAST MEDICAL HISTORY :  Past Medical History:  Diagnosis Date   Arthritis    Breast cancer ([place])    Bronchitis    Cancer ([place])    basal cell   Change in bowel habits    Colon polyps    Femur fracture, left ([facility])    Hepatitis    as a child   Hyperlipidemia    Hypertension    controlled with medication;    Hypothyroidism    h/o   MGUS (monoclonal gammopathy of unknown significance)    Osteoporosis    hasn't been checked in a while; unsure how severe;    Postmenopausal    Rectal bleeding    Seasonal allergies    Sleep apnea    does not use cpap   Stroke [facility]) ??   pt unaware but was told she had stroke possibly after fall in 2016-has occ balance issues   Vitamin D deficiency     PAST SURGICAL HISTORY :   Past Surgical History:  Procedure Laterality Date   AXILLARY LYMPH NODE BIOPSY Right 11/01/2021   Procedure: AXILLARY LYMPH NODE BIOPSY;  Surgeon: Robert Bellow, MD;  Location: [facility] ORS;  Service: General;  Laterality: Right;    BREAST BIOPSY Left 09/30/2008   Patient presented with acute swelling in the breast, aspiration culture negative.  Subsequent core biopsy showing fragments of cyst wall with granulation tissue, fibrosis and focal residual epithelial lining.  No malignancy.   BREAST BIOPSY Right 09/22/2021   ULTRASOUND-GUIDED BIOPSY: - INVASIVE MAMMARY CARCINOMA WITH LOBULAR FEATURES   BREAST CYST ASPIRATION     BREAST LUMPECTOMY WITH NEEDLE LOCALIZATION Right 11/01/2021   Procedure: BREAST LUMPECTOMY WITH NEEDLE LOCALIZATION;  Surgeon: Robert Bellow, MD;  Location: [facility] ORS;  Service: General;  Laterality: Right;   BREAST SURGERY Right 11/01/2021   wire loc w/ sn for lumpectomy   CATARACT EXTRACTION W/PHACO Left 03/02/2021  Procedure: CATARACT EXTRACTION PHACO AND INTRAOCULAR LENS PLACEMENT (IOC) LEFT 6.40 00:35.2;  Surgeon: Birder Robson, MD;  Location: [place];  Service: Ophthalmology;  Laterality: Left;   CATARACT EXTRACTION W/PHACO Right 03/16/2021   Procedure: CATARACT EXTRACTION PHACO AND INTRAOCULAR LENS PLACEMENT ([place]) RIGHT;  Surgeon: Birder Robson, MD;  Location: [place];  Service: Ophthalmology;  Laterality: Right;  7.42 0:51.0   COLONOSCOPY WITH PROPOFOL N/A 02/05/2015   Procedure: COLONOSCOPY WITH PROPOFOL;  Surgeon: Manya Silvas, MD;  Location: [facility] ENDOSCOPY;  Service: Endoscopy;  Laterality: N/A;   COLONOSCOPY WITH PROPOFOL N/A 04/08/2020   Procedure: COLONOSCOPY WITH PROPOFOL;  Surgeon: Toledo, Benay Pike, MD;  Location: [facility] ENDOSCOPY;  Service: Gastroenterology;  Laterality: N/A;   FEMUR FRACTURE SURGERY Left 04/18/2015   FRACTURE SURGERY     TONSILLECTOMY      FAMILY HISTORY :   Family History  Problem Relation Age of Onset   Breast cancer Cousin        paternal   Kidney cancer Cousin        maternal   Leukemia Cousin        maternal   Stomach cancer Other        uncle   Prostate cancer Other        grandfather   Skin cancer Other         maternal grandparents    SOCIAL HISTORY:   Social History   Tobacco Use   Smoking status: Never   Smokeless tobacco: Never  Vaping Use   Vaping Use: Never used  Substance Use Topics   Alcohol use: No   Drug use: No    ALLERGIES:  is allergic to mold extract [trichophyton], codeine, indomethacin, other, sulfa antibiotics, cashew nut (anacardium occidentale) skin test, nickel, and sulfamethoxazole-trimethoprim.  MEDICATIONS:  Current Outpatient Medications  Medication Sig Dispense Refill   Alpha-Lipoic Acid 600 MG TABS Take 600 mg by mouth daily.     aspirin 81 MG EC tablet Take 81 mg by mouth daily.     Biotin 5 MG TBDP Take 5 mg by mouth daily.     CALCIUM-VITAMIN D PO Take 1 tablet by mouth daily.     ibuprofen (ADVIL,MOTRIN) 200 MG tablet Take 400 mg by mouth every 8 (eight) hours as needed for moderate pain. (Patient not taking: Reported on 09/01/2022)     L-Lysine 500 MG TABS Take 1,000 mg by mouth 4 (four) times daily as needed (mouth sores).     losartan-hydrochlorothiazide (HYZAAR) 50-12.5 MG tablet Take 0.5 tablets by mouth every morning.     Lutein 10 MG TABS Take 10 mg by mouth daily.     rosuvastatin (CRESTOR) 5 MG tablet Take 5 mg by mouth every other day. MORNING     Vitamin D, Ergocalciferol, (DRISDOL) 1.25 MG (50000 UNIT) CAPS capsule Take 50,000 Units by mouth every Sunday.     No current facility-administered medications for this visit.    PHYSICAL EXAMINATION:   There were no vitals taken for this visit.  There were no vitals filed for this visit.   Physical Exam HENT:     Head: Normocephalic and atraumatic.     Mouth/Throat:     Pharynx: No oropharyngeal exudate.  Eyes:     Pupils: Pupils are equal, round, and reactive to light.  Cardiovascular:     Rate and Rhythm: Normal rate and regular rhythm.  Pulmonary:     Effort: No respiratory distress.     Breath sounds: No  wheezing.  Abdominal:     General: Bowel sounds are normal. There is no  distension.     Palpations: Abdomen is soft. There is no mass.     Tenderness: There is no abdominal tenderness. There is no guarding or rebound.  Musculoskeletal:        General: No tenderness. Normal range of motion.     Cervical back: Normal range of motion and neck supple.  Skin:    General: Skin is warm.  Neurological:     Mental Status: She is alert and oriented to person, place, and time.  Psychiatric:        Mood and Affect: Affect normal.      LABORATORY DATA:  I have reviewed the data as listed    Component Value Date/Time   NA 140 09/05/2018 0730   NA 141 09/29/2011 1424   K 4.1 10/26/2021 1355   K 3.9 09/29/2011 1424   CL 107 09/05/2018 0730   CL 100 09/29/2011 1424   CO2 25 09/05/2018 0730   CO2 25 09/29/2011 1424   GLUCOSE 125 (H) 09/05/2018 0730   GLUCOSE 172 (H) 09/29/2011 1424   BUN 23 09/05/2018 0730   BUN 19 (H) 09/29/2011 1424   CREATININE 0.80 03/30/2022 1413   CREATININE 0.79 09/29/2011 1424   CALCIUM 9.0 09/05/2018 0730   CALCIUM 10.1 09/29/2011 1424   PROT 7.3 09/05/2018 0730   PROT 8.5 (H) 09/29/2011 1424   ALBUMIN 3.5 09/05/2018 0730   ALBUMIN 4.1 09/29/2011 1424   AST 30 09/05/2018 0730   AST 24 09/29/2011 1424   ALT 22 09/05/2018 0730   ALT 12 09/29/2011 1424   ALKPHOS 83 09/05/2018 0730   ALKPHOS 76 09/29/2011 1424   BILITOT 0.8 09/05/2018 0730   BILITOT 0.8 09/29/2011 1424   GFRNONAA >60 09/05/2018 0730   GFRNONAA >60 09/29/2011 1424   GFRAA >60 09/05/2018 0730   GFRAA >60 09/29/2011 1424    No results found for: "SPEP", "UPEP"  Lab Results  Component Value Date   WBC 6.5 09/05/2018   NEUTROABS 4.2 09/05/2018   HGB 10.9 (L) 09/05/2018   HCT 34.1 (L) 09/05/2018   MCV 89.3 09/05/2018   PLT 242 09/05/2018      Chemistry      Component Value Date/Time   NA 140 09/05/2018 0730   NA 141 09/29/2011 1424   K 4.1 10/26/2021 1355   K 3.9 09/29/2011 1424   CL 107 09/05/2018 0730   CL 100 09/29/2011 1424   CO2 25 09/05/2018  0730   CO2 25 09/29/2011 1424   BUN 23 09/05/2018 0730   BUN 19 (H) 09/29/2011 1424   CREATININE 0.80 03/30/2022 1413   CREATININE 0.79 09/29/2011 1424      Component Value Date/Time   CALCIUM 9.0 09/05/2018 0730   CALCIUM 10.1 09/29/2011 1424   ALKPHOS 83 09/05/2018 0730   ALKPHOS 76 09/29/2011 1424   AST 30 09/05/2018 0730   AST 24 09/29/2011 1424   ALT 22 09/05/2018 0730   ALT 12 09/29/2011 1424   BILITOT 0.8 09/05/2018 0730   BILITOT 0.8 09/29/2011 1424        ASSESSMENT & PLAN:   No problem-specific Assessment & Plan notes found for this encounter.    Cammie Sickle, MD 10/14/2022 9:54 AM

## 2022-10-14 NOTE — Assessment & Plan Note (Deleted)
#   RIGHT INVASIVE LOBULAR CANCER- ER-POSITIVE/PR-NEG; her 2-NEGATIVE;  [s/p lumpectomy; margins 3 mm].  T1b; grade 1.  S/p WBRT [s/p RT may 2nd 2023].  #Again reviewed the role of endocrine therapy- to prevent development of metastatic breast cancer also prevent ipsilateral/contralateral breast cancer.  I again reviewed the mechanism of action of aromatase inhibitors-with blocking of estrogen to prevent breast cancer.  Also reviewed the potential side effects including but not limited to arthralgias hot flashes risk of osteoporosis.  After lengthy discussion patient declines any further systemic adjuvant therapy.  # Patient has a personal history of osteoporosis [2022-BMD; [place]];   # IgA lambda MGUS [since 2016]- AUG 2023- 1.3- kappa/lamda-N [[facility]]; CBC/CMP-N.  Monitor for now.  Again reviewed the low/risk of transformation into multiple myeloma at this time.  # Vaccinations: COVID/RSV-flu/defer to Pharmacy; no contraindications from oncology standpoint.  # DISPOSITION:  # follow-up in 6 month-MD;[facility]- 1 weeks prior- labs- cbc/cmp; MM panel; K/L light chain ratio-Dr.B  Cc; Dr.Hedrick-

## 2022-10-21 DIAGNOSIS — Z170 Estrogen receptor positive status [ER+]: Secondary | ICD-10-CM | POA: Diagnosis not present

## 2022-10-21 DIAGNOSIS — C50411 Malignant neoplasm of upper-outer quadrant of right female breast: Secondary | ICD-10-CM | POA: Diagnosis not present

## 2022-11-01 ENCOUNTER — Encounter: Payer: Self-pay | Admitting: Internal Medicine

## 2022-11-02 ENCOUNTER — Ambulatory Visit
Admission: RE | Admit: 2022-11-02 | Discharge: 2022-11-02 | Disposition: A | Discharge status: Home / Self Care | Payer: Medicare HMO | Source: Ambulatory Visit | Attending: Radiation Oncology | Admitting: Radiation Oncology

## 2022-11-02 DIAGNOSIS — Z170 Estrogen receptor positive status [ER+]: Secondary | ICD-10-CM

## 2022-11-02 DIAGNOSIS — R922 Inconclusive mammogram: Secondary | ICD-10-CM | POA: Diagnosis not present

## 2022-11-02 DIAGNOSIS — C50411 Malignant neoplasm of upper-outer quadrant of right female breast: Secondary | ICD-10-CM | POA: Diagnosis not present

## 2022-11-02 HISTORY — DX: Personal history of irradiation: Z92.3

## 2022-11-03 DIAGNOSIS — L821 Other seborrheic keratosis: Secondary | ICD-10-CM | POA: Diagnosis not present

## 2022-11-03 DIAGNOSIS — L853 Xerosis cutis: Secondary | ICD-10-CM | POA: Diagnosis not present

## 2022-11-04 ENCOUNTER — Inpatient Hospital Stay: Payer: Medicare HMO | Attending: Internal Medicine | Admitting: Internal Medicine

## 2022-11-04 ENCOUNTER — Encounter: Payer: Self-pay | Admitting: Internal Medicine

## 2022-11-04 VITALS — BP 170/84 | HR 76 | Temp 96.7°F | Resp 16 | Wt 159.1 lb

## 2022-11-04 DIAGNOSIS — E039 Hypothyroidism, unspecified: Secondary | ICD-10-CM | POA: Insufficient documentation

## 2022-11-04 DIAGNOSIS — G4730 Sleep apnea, unspecified: Secondary | ICD-10-CM | POA: Diagnosis not present

## 2022-11-04 DIAGNOSIS — D472 Monoclonal gammopathy: Secondary | ICD-10-CM | POA: Diagnosis not present

## 2022-11-04 DIAGNOSIS — C50411 Malignant neoplasm of upper-outer quadrant of right female breast: Secondary | ICD-10-CM

## 2022-11-04 DIAGNOSIS — Z7982 Long term (current) use of aspirin: Secondary | ICD-10-CM | POA: Diagnosis not present

## 2022-11-04 DIAGNOSIS — Z79899 Other long term (current) drug therapy: Secondary | ICD-10-CM | POA: Diagnosis not present

## 2022-11-04 DIAGNOSIS — Z923 Personal history of irradiation: Secondary | ICD-10-CM | POA: Insufficient documentation

## 2022-11-04 DIAGNOSIS — Z170 Estrogen receptor positive status [ER+]: Secondary | ICD-10-CM | POA: Insufficient documentation

## 2022-11-04 DIAGNOSIS — E785 Hyperlipidemia, unspecified: Secondary | ICD-10-CM | POA: Diagnosis not present

## 2022-11-04 DIAGNOSIS — I10 Essential (primary) hypertension: Secondary | ICD-10-CM | POA: Insufficient documentation

## 2022-11-04 NOTE — Progress Notes (Signed)
[facility] OFFICE PROGRESS NOTE  Patient Care Team: Hedrick, James, [name] Elida Harbin R, MD as Consulting Physician (Oncology) Chrystal, Glenn, MD as Consulting Physician (Radiation Oncology)   SUMMARY OF ONCOLOGIC HISTORY: Breast cancer    Oncology History Overview Note  # JAN 2023- # RIGHT breast IMC with lobular feathureds ER-POSITIVE/PR-NEG; her 2-2+IHC; FISH-PENIDNG; G-1. JAN 2023- MRI-MRI shows 1.8 cm right upper upper quadrant mass [s/p recent biopsy; Dr.Byrnett].DIAGNOSIS:  A. BREAST, RIGHT UPPER OUTER QUADRANT; EXCISION:  - INVASIVE LOBULAR CARCINOMA.  - CLIP AND BIOPSY SITE IDENTIFIED.  - SEE CANCER SUMMARY.   Comment:  Multiple additional deeper recut levels were examined, due to disruption  of tissue in the initial slides demonstrating tumor. Invasive carcinoma  measures up to 9 mm in greatest linear extent, although there is dense  fibrosis surrounding the invasive carcinoma, which may account for the  discrepancy between the microscopic measurement, and the radiographic  and gross measurements.   B. LYMPH NODES, RIGHT SENTINEL #1, #2, #3, AND #4; EXCISION:  - FOUR LYMPH NODES, NEGATIVE FOR MALIGNANCY (0/4).   Comment:  Immunohistochemical studies directed against cytokeratin AE1/AE3 are  performed on all 4 tissue blocks, and are negative for metastatic  carcinoma   C. BREAST, RIGHT, ADDITIONAL ANTERIOR/INFERIOR MARGIN; EXCISION:  - BENIGN MAMMARY PARENCHYMA WITH FIBROCYSTIC AND APOCRINE CHANGES,  COLUMNAR CELL CHANGE WITHOUT ATYPIA, USUAL DUCTAL HYPERPLASIA, AND FOCAL  SCLEROSING ADENOSIS.  - FOCAL CHANGES SUGGESTIVE OF BENIGN INTRADUCTAL PAPILLOMA.  - NEGATIVE FOR RESIDUAL INVASIVE LOBULAR CARCINOMA AND OTHER ATYPICAL  PROLIFERATIVE BREAST DISEASE.   CANCER CASE SUMMARY: INVASIVE CARCINOMA OF THE BREAST  Standard(s): AJCC-UICC 8   SPECIMEN  Procedure: Excision  Specimen Laterality: Right   TUMOR   Histologic Type: Invasive lobular carcinoma  Histologic Grade (Nottingham Histologic Score)                       Glandular (Acinar)/Tubular Differentiation: 3                       Nuclear Pleomorphism: 1                       Mitotic Rate: 1                       Overall Grade: Grade 1  Tumor Size: 9 mm  Tumor Focality: Single focus of invasive carcinoma  Tumor Extent: Not applicable  Ductal Carcinoma In Situ (DCIS): Not identified  Lymphatic and/or Vascular Invasion: Not identified  Treatment Effect in the Breast: No known presurgical therapy   MARGINS  Margin Status for Invasive Carcinoma: All margins negative for invasive  carcinoma                       Distance from closest margin: 3 mm                       Specify closest margin: Posterior/deep   Margin Status for DCIS: Not applicable (no DCIS in specimen)   #Stage I ER/PR positive HER2 negative breast cancer pT1b grade 1; no Oncotype done.  S/p lumpectomy  #Dec 28, 2021-s/p radiation; May  17th, 2023-declined AI    July 2016-M-protein-IgA Lamda- MGUS: NOV 2016- M spike- 1gm/dl; IgA- 1898 [total]; K/L=N; AUG 2016- Skeletal survey-Normal;  # Stroke- ? [Dr.Shah; 2018]   Carcinoma of   upper-outer quadrant of right breast in female, estrogen receptor positive ([place])  10/01/2021 Initial Diagnosis   Carcinoma of upper-outer quadrant of right breast in female, estrogen receptor positive ([place])   11/10/2021 Cancer Staging   Staging form: Breast, AJCC 8th Edition - Clinical: Stage IA (cT1b, cN0, cM0, G1, ER+, PR-, HER2-) - Signed by Cammie Sickle, MD on 11/10/2021 Histologic grading system: 3 grade system    INTERVAL HISTORY: Alone.  Ambulating independently.  82 year old female patient with diagnosis of stage I breast cancer lobular ER positive PR negative HER2 negative s/p lumpectomy s/p RT; declined adjuvant AI is   here for follow-up; is here to review the results of MGUS work up.  Denies any new shortness of breath  or cough.  Denies any new symptoms.  Review of Systems  Constitutional:  Negative for chills, diaphoresis, fever, malaise/fatigue and weight loss.  HENT:  Negative for nosebleeds and sore throat.   Eyes:  Negative for double vision.  Respiratory:  Negative for cough, hemoptysis, sputum production, shortness of breath and wheezing.   Cardiovascular:  Negative for chest pain, palpitations, orthopnea and leg swelling.  Gastrointestinal:  Negative for abdominal pain, blood in stool, constipation, diarrhea, heartburn, melena, nausea and vomiting.  Genitourinary:  Negative for dysuria, frequency and urgency.  Musculoskeletal:  Positive for back pain and joint pain.  Skin: Negative.  Negative for itching and rash.  Neurological:  Negative for dizziness, tingling, focal weakness, weakness and headaches.  Endo/Heme/Allergies:  Does not bruise/bleed easily.  Psychiatric/Behavioral:  Negative for depression. The patient is not nervous/anxious and does not have insomnia.      PAST MEDICAL HISTORY :  Past Medical History:  Diagnosis Date   Arthritis    Breast cancer ([place])    Bronchitis    Cancer ([place])    basal cell   Change in bowel habits    Colon polyps    Femur fracture, left ([facility])    Hepatitis    as a child   Hyperlipidemia    Hypertension    controlled with medication;    Hypothyroidism    h/o   MGUS (monoclonal gammopathy of unknown significance)    Osteoporosis    hasn't been checked in a while; unsure how severe;    Personal history of radiation therapy    Postmenopausal    Rectal bleeding    Seasonal allergies    Sleep apnea    does not use cpap   Stroke [facility]) ??   pt unaware but was told she had stroke possibly after fall in 2016-has occ balance issues   Vitamin D deficiency     PAST SURGICAL HISTORY :   Past Surgical History:  Procedure Laterality Date   AXILLARY LYMPH NODE BIOPSY Right 11/01/2021   Procedure: AXILLARY LYMPH NODE BIOPSY;  Surgeon: Robert Bellow, MD;  Location: [facility] ORS;  Service: General;  Laterality: Right;   BREAST BIOPSY Left 09/30/2008   Patient presented with acute swelling in the breast, aspiration culture negative.  Subsequent core biopsy showing fragments of cyst wall with granulation tissue, fibrosis and focal residual epithelial lining.  No malignancy.   BREAST BIOPSY Right 09/22/2021   ULTRASOUND-GUIDED BIOPSY: - INVASIVE MAMMARY CARCINOMA WITH LOBULAR FEATURES   BREAST CYST ASPIRATION     BREAST LUMPECTOMY Right 11/01/2021   BREAST LUMPECTOMY WITH NEEDLE LOCALIZATION Right 11/01/2021   Procedure: BREAST LUMPECTOMY WITH NEEDLE LOCALIZATION;  Surgeon: Robert Bellow, MD;  Location: [facility] OR[facility];  Service: General;  Laterality: Right;  BREAST SURGERY Right 11/01/2021   wire loc w/ sn for lumpectomy   CATARACT EXTRACTION W/PHACO Left 03/02/2021   Procedure: CATARACT EXTRACTION PHACO AND INTRAOCULAR LENS PLACEMENT (IOC) LEFT 6.40 00:35.2;  Surgeon: Birder Robson, MD;  Location: [place];  Service: Ophthalmology;  Laterality: Left;   CATARACT EXTRACTION W/PHACO Right 03/16/2021   Procedure: CATARACT EXTRACTION PHACO AND INTRAOCULAR LENS PLACEMENT ([place]) RIGHT;  Surgeon: Birder Robson, MD;  Location: [place];  Service: Ophthalmology;  Laterality: Right;  7.42 0:51.0   COLONOSCOPY WITH PROPOFOL N/A 02/05/2015   Procedure: COLONOSCOPY WITH PROPOFOL;  Surgeon: Manya Silvas, MD;  Location: [facility] ENDOSCOPY;  Service: Endoscopy;  Laterality: N/A;   COLONOSCOPY WITH PROPOFOL N/A 04/08/2020   Procedure: COLONOSCOPY WITH PROPOFOL;  Surgeon: Toledo, Benay Pike, MD;  Location: [facility] ENDOSCOPY;  Service: Gastroenterology;  Laterality: N/A;   FEMUR FRACTURE SURGERY Left 04/18/2015   FRACTURE SURGERY     TONSILLECTOMY      FAMILY HISTORY :   Family History  Problem Relation Age of Onset   Breast cancer Cousin        paternal   Kidney cancer Cousin        maternal   Leukemia Cousin        maternal    Stomach cancer Other        uncle   Prostate cancer Other        grandfather   Skin cancer Other        maternal grandparents    SOCIAL HISTORY:   Social History   Tobacco Use   Smoking status: Never   Smokeless tobacco: Never  Vaping Use   Vaping Use: Never used  Substance Use Topics   Alcohol use: No   Drug use: No    ALLERGIES:  is allergic to mold extract [trichophyton], codeine, indomethacin, other, sulfa antibiotics, cashew nut (anacardium occidentale) skin test, nickel, and sulfamethoxazole-trimethoprim.  MEDICATIONS:  Current Outpatient Medications  Medication Sig Dispense Refill   Alpha-Lipoic Acid 600 MG TABS Take 600 mg by mouth daily.     aspirin 81 MG EC tablet Take 81 mg by mouth daily.     Biotin 5 MG TBDP Take 5 mg by mouth daily.     CALCIUM-VITAMIN D PO Take 1 tablet by mouth daily.     L-Lysine 500 MG TABS Take 1,000 mg by mouth 4 (four) times daily as needed (mouth sores).     losartan-hydrochlorothiazide (HYZAAR) 50-12.5 MG tablet Take 0.5 tablets by mouth every morning.     Lutein 10 MG TABS Take 10 mg by mouth daily.     rosuvastatin (CRESTOR) 5 MG tablet Take 5 mg by mouth every other day. MORNING     Vitamin D, Ergocalciferol, (DRISDOL) 1.25 MG (50000 UNIT) CAPS capsule Take 50,000 Units by mouth every Sunday.     ibuprofen (ADVIL,MOTRIN) 200 MG tablet Take 400 mg by mouth every 8 (eight) hours as needed for moderate pain. (Patient not taking: Reported on 09/01/2022)     No current facility-administered medications for this visit.    PHYSICAL EXAMINATION:   BP (!) 170/84 (BP Location: Left Arm, Patient Position: Sitting)   Pulse 76   Temp (!) 96.7 F (35.9 C) (Tympanic)   Resp 16   Wt 159 lb 1.6 oz (72.2 kg)   BMI 29.10 kg/m   Filed Weights   11/04/22 1500  Weight: 159 lb 1.6 oz (72.2 kg)     Physical Exam HENT:  Head: Normocephalic and atraumatic.     Mouth/Throat:     Pharynx: No oropharyngeal exudate.  Eyes:     Pupils:  Pupils are equal, round, and reactive to light.  Cardiovascular:     Rate and Rhythm: Normal rate and regular rhythm.  Pulmonary:     Effort: No respiratory distress.     Breath sounds: No wheezing.  Abdominal:     General: Bowel sounds are normal. There is no distension.     Palpations: Abdomen is soft. There is no mass.     Tenderness: There is no abdominal tenderness. There is no guarding or rebound.  Musculoskeletal:        General: No tenderness. Normal range of motion.     Cervical back: Normal range of motion and neck supple.  Skin:    General: Skin is warm.  Neurological:     Mental Status: She is alert and oriented to person, place, and time.  Psychiatric:        Mood and Affect: Affect normal.      LABORATORY DATA:  I have reviewed the data as listed    Component Value Date/Time   NA 140 09/05/2018 0730   NA 141 09/29/2011 1424   K 4.1 10/26/2021 1355   K 3.9 09/29/2011 1424   CL 107 09/05/2018 0730   CL 100 09/29/2011 1424   CO2 25 09/05/2018 0730   CO2 25 09/29/2011 1424   GLUCOSE 125 (H) 09/05/2018 0730   GLUCOSE 172 (H) 09/29/2011 1424   BUN 23 09/05/2018 0730   BUN 19 (H) 09/29/2011 1424   CREATININE 0.80 03/30/2022 1413   CREATININE 0.79 09/29/2011 1424   CALCIUM 9.0 09/05/2018 0730   CALCIUM 10.1 09/29/2011 1424   PROT 7.3 09/05/2018 0730   PROT 8.5 (H) 09/29/2011 1424   ALBUMIN 3.5 09/05/2018 0730   ALBUMIN 4.1 09/29/2011 1424   AST 30 09/05/2018 0730   AST 24 09/29/2011 1424   ALT 22 09/05/2018 0730   ALT 12 09/29/2011 1424   ALKPHOS 83 09/05/2018 0730   ALKPHOS 76 09/29/2011 1424   BILITOT 0.8 09/05/2018 0730   BILITOT 0.8 09/29/2011 1424   GFRNONAA >60 09/05/2018 0730   GFRNONAA >60 09/29/2011 1424   GFRAA >60 09/05/2018 0730   GFRAA >60 09/29/2011 1424    No results found for: "SPEP", "UPEP"  Lab Results  Component Value Date   WBC 6.5 09/05/2018   NEUTROABS 4.2 09/05/2018   HGB 10.9 (L) 09/05/2018   HCT 34.1 (L) 09/05/2018    MCV 89.3 09/05/2018   PLT 242 09/05/2018      Chemistry      Component Value Date/Time   NA 140 09/05/2018 0730   NA 141 09/29/2011 1424   K 4.1 10/26/2021 1355   K 3.9 09/29/2011 1424   CL 107 09/05/2018 0730   CL 100 09/29/2011 1424   CO2 25 09/05/2018 0730   CO2 25 09/29/2011 1424   BUN 23 09/05/2018 0730   BUN 19 (H) 09/29/2011 1424   CREATININE 0.80 03/30/2022 1413   CREATININE 0.79 09/29/2011 1424      Component Value Date/Time   CALCIUM 9.0 09/05/2018 0730   CALCIUM 10.1 09/29/2011 1424   ALKPHOS 83 09/05/2018 0730   ALKPHOS 76 09/29/2011 1424   AST 30 09/05/2018 0730   AST 24 09/29/2011 1424   ALT 22 09/05/2018 0730   ALT 12 09/29/2011 1424   BILITOT 0.8 09/05/2018 0730   BILITOT 0.8 09/29/2011 1424  ASSESSMENT & PLAN:   Carcinoma of upper-outer quadrant of right breast in female, estrogen receptor positive ([place]) # RIGHT INVASIVE LOBULAR CANCER- ER-POSITIVE/PR-NEG; her 2-NEGATIVE;  [s/p lumpectomy; margins 3 mm].  T1b; grade 1.  S/p WBRT [s/p RT may 2nd 2023]. DECLINED any endocrine adjuvant therapy.  Clinically stable.  Reviewed MARCH 2024- Bilateral diagnostic mammogram- WNL: will repeat mammo (with RIGHT and LEFT breast ultrasound if deemed necessary) in 1 year [Dr.Cintron]  # IgA lambda MGUS [since 2016]- MARCH 2024- 1.1- kappa/lamda-N [[facility]]; CBC/CMP-N.  Monitor for now.  Again reviewed the low/risk of transformation into multiple myeloma at this time.  Continue stable at this time every 6 months.  Multiple questions answered.   # Vaccinations: COVID/RSV-flu/defer to Pharmacy; no contraindications from oncology standpoint.  # DISPOSITION:  # follow-up in 6 month-MD;[facility]- 2 weeks prior- labs- cbc/cmp; MM panel; K/L light chain ratio-Dr.B  Cc- [place], MD 11/04/2022 4:16 PM

## 2022-11-04 NOTE — Assessment & Plan Note (Addendum)
#   RIGHT INVASIVE LOBULAR CANCER- ER-POSITIVE/PR-NEG; her 2-NEGATIVE;  [s/p lumpectomy; margins 3 mm].  T1b; grade 1.  S/p WBRT [s/p RT may 2nd 2023]. DECLINED any endocrine adjuvant therapy.  Clinically stable.  Reviewed MARCH 2024- Bilateral diagnostic mammogram- WNL: will repeat mammo (with RIGHT and LEFT breast ultrasound if deemed necessary) in 1 year [Dr.Cintron]  # IgA lambda MGUS [since 2016]- MARCH 2024- 1.1- kappa/lamda-N [[facility]]; CBC/CMP-N.  Monitor for now.  Again reviewed the low/risk of transformation into multiple myeloma at this time.  Continue stable at this time every 6 months.  Multiple questions answered.   # Vaccinations: COVID/RSV-flu/defer to Pharmacy; no contraindications from oncology standpoint.  # DISPOSITION:  # follow-up in 6 month-MD;[facility]- 2 weeks prior- labs- cbc/cmp; MM panel; K/L light chain ratio-Dr.B  Cc- Dr.Hedrick-

## 2022-11-04 NOTE — Progress Notes (Signed)
Patient denies new problems/concerns today.   °

## 2022-11-10 DIAGNOSIS — C50211 Malignant neoplasm of upper-inner quadrant of right female breast: Secondary | ICD-10-CM | POA: Diagnosis not present

## 2022-11-10 DIAGNOSIS — Z170 Estrogen receptor positive status [ER+]: Secondary | ICD-10-CM | POA: Diagnosis not present

## 2022-11-18 DIAGNOSIS — R195 Other fecal abnormalities: Secondary | ICD-10-CM | POA: Diagnosis not present

## 2022-11-18 DIAGNOSIS — Z86010 Personal history of colonic polyps: Secondary | ICD-10-CM | POA: Diagnosis not present

## 2022-11-18 DIAGNOSIS — R001 Bradycardia, unspecified: Secondary | ICD-10-CM | POA: Diagnosis not present

## 2022-12-01 DIAGNOSIS — E785 Hyperlipidemia, unspecified: Secondary | ICD-10-CM | POA: Diagnosis not present

## 2022-12-01 DIAGNOSIS — I10 Essential (primary) hypertension: Secondary | ICD-10-CM | POA: Diagnosis not present

## 2022-12-01 DIAGNOSIS — Z1331 Encounter for screening for depression: Secondary | ICD-10-CM | POA: Diagnosis not present

## 2022-12-01 DIAGNOSIS — Z0000 Encounter for general adult medical examination without abnormal findings: Secondary | ICD-10-CM | POA: Diagnosis not present

## 2022-12-13 ENCOUNTER — Encounter: Payer: Self-pay | Admitting: *Deleted

## 2022-12-19 ENCOUNTER — Encounter: Payer: Self-pay | Admitting: *Deleted

## 2022-12-20 ENCOUNTER — Ambulatory Visit: Payer: Medicare HMO | Admitting: Anesthesiology

## 2022-12-20 ENCOUNTER — Encounter
Admission: RE | Disposition: A | Discharge status: Home / Self Care | Payer: Self-pay | Source: Ambulatory Visit | Attending: Gastroenterology

## 2022-12-20 ENCOUNTER — Encounter: Payer: Self-pay | Admitting: *Deleted

## 2022-12-20 ENCOUNTER — Ambulatory Visit
Admission: RE | Admit: 2022-12-20 | Discharge: 2022-12-20 | Disposition: A | Discharge status: Home / Self Care | Payer: Medicare HMO | Source: Ambulatory Visit | Attending: Gastroenterology | Admitting: Gastroenterology

## 2022-12-20 DIAGNOSIS — K649 Unspecified hemorrhoids: Secondary | ICD-10-CM | POA: Diagnosis not present

## 2022-12-20 DIAGNOSIS — M810 Age-related osteoporosis without current pathological fracture: Secondary | ICD-10-CM | POA: Insufficient documentation

## 2022-12-20 DIAGNOSIS — K5730 Diverticulosis of large intestine without perforation or abscess without bleeding: Secondary | ICD-10-CM | POA: Insufficient documentation

## 2022-12-20 DIAGNOSIS — Z853 Personal history of malignant neoplasm of breast: Secondary | ICD-10-CM | POA: Insufficient documentation

## 2022-12-20 DIAGNOSIS — R195 Other fecal abnormalities: Secondary | ICD-10-CM | POA: Diagnosis not present

## 2022-12-20 DIAGNOSIS — I10 Essential (primary) hypertension: Secondary | ICD-10-CM | POA: Insufficient documentation

## 2022-12-20 DIAGNOSIS — Z8619 Personal history of other infectious and parasitic diseases: Secondary | ICD-10-CM | POA: Diagnosis not present

## 2022-12-20 DIAGNOSIS — K6389 Other specified diseases of intestine: Secondary | ICD-10-CM | POA: Insufficient documentation

## 2022-12-20 DIAGNOSIS — K635 Polyp of colon: Secondary | ICD-10-CM | POA: Diagnosis not present

## 2022-12-20 DIAGNOSIS — Z1211 Encounter for screening for malignant neoplasm of colon: Secondary | ICD-10-CM | POA: Insufficient documentation

## 2022-12-20 DIAGNOSIS — Z8673 Personal history of transient ischemic attack (TIA), and cerebral infarction without residual deficits: Secondary | ICD-10-CM | POA: Diagnosis not present

## 2022-12-20 DIAGNOSIS — K640 First degree hemorrhoids: Secondary | ICD-10-CM | POA: Insufficient documentation

## 2022-12-20 DIAGNOSIS — Z86010 Personal history of colonic polyps: Secondary | ICD-10-CM | POA: Insufficient documentation

## 2022-12-20 DIAGNOSIS — D123 Benign neoplasm of transverse colon: Secondary | ICD-10-CM | POA: Diagnosis not present

## 2022-12-20 DIAGNOSIS — G4730 Sleep apnea, unspecified: Secondary | ICD-10-CM | POA: Insufficient documentation

## 2022-12-20 DIAGNOSIS — E785 Hyperlipidemia, unspecified: Secondary | ICD-10-CM | POA: Diagnosis not present

## 2022-12-20 HISTORY — PX: COLONOSCOPY WITH PROPOFOL: SHX5780

## 2022-12-20 SURGERY — COLONOSCOPY WITH PROPOFOL
Anesthesia: General

## 2022-12-20 MED ORDER — PROPOFOL 1000 MG/100ML IV EMUL
INTRAVENOUS | Status: AC
  Filled 2022-12-20: qty 100

## 2022-12-20 MED ORDER — PROPOFOL 500 MG/50ML IV EMUL
INTRAVENOUS | Status: DC | PRN
  Administered 2022-12-20: 120 ug/kg/min via INTRAVENOUS

## 2022-12-20 MED ORDER — SODIUM CHLORIDE 0.9 % IV SOLN
INTRAVENOUS | Status: DC
  Administered 2022-12-20: 1000 mL via INTRAVENOUS

## 2022-12-20 NOTE — Anesthesia Postprocedure Evaluation (Signed)
Anesthesia Post Note  Patient: Kristin Wise  Procedure(s) Performed: COLONOSCOPY WITH PROPOFOL  Patient location during evaluation: PACU Anesthesia Type: General Level of consciousness: awake and alert, oriented and patient cooperative Pain management: pain level controlled Vital Signs Assessment: post-procedure vital signs reviewed and stable Respiratory status: spontaneous breathing, nonlabored ventilation and respiratory function stable Cardiovascular status: blood pressure returned to baseline and stable Postop Assessment: adequate PO intake Anesthetic complications: no   There were no known notable events for this encounter.   Last Vitals:  Vitals:   12/20/22 1328 12/20/22 1338  BP: (!) 112/40 110/71  Pulse:  64  Resp: (!) 22 14  Temp: (!) 35.6 C   SpO2: 98% 99%    Last Pain:  Vitals:   12/20/22 1338  TempSrc:   PainSc: 0-No pain                 Reed Breech

## 2022-12-20 NOTE — Op Note (Addendum)
[facility] Gastroenterology Patient Name: Kristin Wise Procedure Date: 12/20/2022 12:44 PM MRN: [id number] Account #: [id number] Date of Birth: October 12, 1940 Admit Type: Outpatient Age: 82 Room: [facility] ENDO ROOM 1 Gender: Female Note Status: Supervisor Override Instrument Name: Nelda Marseille [id number] Procedure:             Colonoscopy Indications:           Personal history of colonic polyps, Positive Cologuard                         test Providers:             Eather Colas MD, MD Referring MD:          Rhona Leavens. Burnett Sheng, MD (Referring MD) Medicines:             Monitored Anesthesia Care Complications:         No immediate complications. Estimated blood loss:                         Minimal. Procedure:             Pre-Anesthesia Assessment:                        - Prior to the procedure, a History and Physical was                         performed, and patient medications and allergies were                         reviewed. The patient is competent. The risks and                         benefits of the procedure and the sedation options and                         risks were discussed with the patient. All questions                         were answered and informed consent was obtained.                         Patient identification and proposed procedure were                         verified by the physician, the nurse, the                         anesthesiologist, the anesthetist and the technician                         in the endoscopy suite. Mental Status Examination:                         alert and oriented. Airway Examination: normal                         oropharyngeal airway and neck mobility. Respiratory  Examination: clear to auscultation. CV Examination:                         normal. Prophylactic Antibiotics: The patient does not                         require prophylactic antibiotics. Prior                          Anticoagulants: The patient has taken no anticoagulant                         or antiplatelet agents. ASA Grade Assessment: III - A                         patient with severe systemic disease. After reviewing                         the risks and benefits, the patient was deemed in                         satisfactory condition to undergo the procedure. The                         anesthesia plan was to use monitored anesthesia care                         (MAC). Immediately prior to administration of                         medications, the patient was re-assessed for adequacy                         to receive sedatives. The heart rate, respiratory                         rate, oxygen saturations, blood pressure, adequacy of                         pulmonary ventilation, and response to care were                         monitored throughout the procedure. The physical                         status of the patient was re-assessed after the                         procedure.                        After obtaining informed consent, the colonoscope was                         passed under direct vision. Throughout the procedure,                         the patient's blood pressure, pulse, and oxygen  saturations were monitored continuously. The                         Colonoscope was introduced through the anus and                         advanced to the the terminal ileum. The colonoscopy                         was performed without difficulty. The patient                         tolerated the procedure well. The quality of the bowel                         preparation was good. The terminal ileum, ileocecal                         valve, appendiceal orifice, and rectum were                         photographed. Findings:      The perianal and digital rectal examinations were normal.      The terminal ileum appeared normal.      A 2 mm polyp was found in the  transverse colon. The polyp was sessile.       The polyp was removed with a cold snare. Resection and retrieval were       complete. Estimated blood loss was minimal.      A 2 mm polyp was found in the sigmoid colon. The polyp was sessile. The       polyp was removed with a cold snare. Resection and retrieval were       complete. Estimated blood loss was minimal.      Multiple small-mouthed diverticula were found in the sigmoid colon,       descending colon and distal transverse colon.      Internal hemorrhoids were found during retroflexion. The hemorrhoids       were Grade I (internal hemorrhoids that do not prolapse).      The exam was otherwise without abnormality on direct and retroflexion       views. Impression:            - The examined portion of the ileum was normal.                        - One 2 mm polyp in the transverse colon, removed with                         a cold snare. Resected and retrieved.                        - One 2 mm polyp in the sigmoid colon, removed with a                         cold snare. Resected and retrieved.                        - Diverticulosis in the sigmoid colon, in the  descending colon and in the distal transverse colon.                        - Internal hemorrhoids.                        - The examination was otherwise normal on direct and                         retroflexion views. Recommendation:        - Discharge patient to home.                        - Resume previous diet.                        - Continue present medications.                        - Await pathology results.                        - Repeat colonoscopy is not recommended due to current                         age (40 years or older) for surveillance.                        - Return to referring physician as previously                         scheduled. Procedure Code(s):     --- Professional ---                        [phone], Colonoscopy,  flexible; with removal of                         tumor(s), polyp(s), or other lesion(s) by snare                         technique Diagnosis Code(s):     --- Professional ---                        K64.0, First degree hemorrhoids                        D12.3, Benign neoplasm of transverse colon (hepatic                         flexure or splenic flexure)                        D12.5, Benign neoplasm of sigmoid colon                        R19.5, Other fecal abnormalities                        K57.30, Diverticulosis of large intestine without  perforation or abscess without bleeding CPT copyright 2022 American Medical Association. All rights reserved. The codes documented in this report are preliminary and upon coder review may  be revised to meet current compliance requirements. Eather Colas MD, MD 12/20/2022 1:37:01 PM Number of Addenda: 0 Note Initiated On: 12/20/2022 12:44 PM Scope Withdrawal Time: 0 hours 13 minutes 21 seconds  Total Procedure Duration: 0 hours 19 minutes 58 seconds  Estimated Blood Loss:  Estimated blood loss was minimal.      [facility]

## 2022-12-20 NOTE — Anesthesia Preprocedure Evaluation (Signed)
Anesthesia Evaluation  Patient identified by MRN, date of birth, ID band Patient awake    Reviewed: Allergy & Precautions, NPO status , Patient's Chart, lab work & pertinent test results  History of Anesthesia Complications Negative for: history of anesthetic complications  Airway Mallampati: II  TM Distance: >3 FB Neck ROM: Full    Dental  (+) Implants, Dental Advidsory Given, Teeth Intact   Pulmonary neg shortness of breath, sleep apnea (does not tolerate wearing CPAP) , neg COPD, neg recent URI   breath sounds clear to auscultation- rhonchi (-) wheezing      Cardiovascular Exercise Tolerance: Good hypertension, Pt. on medications (-) angina (-) CAD, (-) Past MI, (-) Cardiac Stents and (-) CABG (-) dysrhythmias (-) Valvular Problems/Murmurs Rhythm:Regular Rate:Normal - Systolic murmurs and - Diastolic murmurs    Neuro/Psych CVA (balance problems), Residual Symptoms    GI/Hepatic negative GI ROS, Neg liver ROS,,,  Endo/Other  negative endocrine ROSneg diabetes    Renal/GU negative Renal ROS     Musculoskeletal negative musculoskeletal ROS (+)    Abdominal  (+) + obese  Peds  Hematology negative hematology ROS (+)   Anesthesia Other Findings Past Medical History: No date: Arthritis No date: Breast cancer (HCC) No date: Bronchitis No date: Cancer (HCC)     Comment:  basal cell No date: Change in bowel habits No date: Colon polyps No date: Femur fracture, left (HCC) No date: Hepatitis     Comment:  as a child No date: Hyperlipidemia No date: Hypertension     Comment:  controlled with medication;  No date: Hypothyroidism     Comment:  h/o No date: MGUS (monoclonal gammopathy of unknown significance) No date: Osteoporosis     Comment:  hasn't been checked in a while; unsure how severe;  No date: Postmenopausal No date: Rectal bleeding No date: Seasonal allergies No date: Sleep apnea     Comment:  does not  use cpap ??: Stroke [facility])     Comment:  pt unaware but was told she had stroke possibly after               fall in 2016-has occ balance issues No date: Vitamin D deficiency   Reproductive/Obstetrics                             Anesthesia Physical Anesthesia Plan  ASA: 3  Anesthesia Plan: General   Post-op Pain Management:    Induction: Intravenous  PONV Risk Score and Plan: 2 and TIVA and Propofol infusion  Airway Management Planned: Natural Airway  Additional Equipment:   Intra-op Plan:   Post-operative Plan:   Informed Consent:      Dental advisory given  Plan Discussed with: CRNA and Anesthesiologist  Anesthesia Plan Comments:         Anesthesia Quick Evaluation

## 2022-12-20 NOTE — Interval H&P Note (Signed)
History and Physical Interval Note:  12/20/2022 12:56 PM  Kristin Wise  has presented today for surgery, with the diagnosis of (+) COLORECTAL.  The various methods of treatment have been discussed with the patient and family. After consideration of risks, benefits and other options for treatment, the patient has consented to  Procedure(s): COLONOSCOPY WITH PROPOFOL (N/A) as a surgical intervention.  The patient's history has been reviewed, patient examined, no change in status, stable for surgery.  I have reviewed the patient's chart and labs.  Questions were answered to the patient's satisfaction.     [name]  Ok to proceed with colonoscopy

## 2022-12-20 NOTE — H&P (Signed)
Outpatient short stay form Pre-procedure 12/20/2022  [name], MD  Primary Physician: Jerl Mina, MD  Reason for visit:  Positive cologuard  History of present illness:    82 y/o lady with history of HLD, hypertension, and MGUS here for colonoscopy due to positive cologuard. Last colonoscopy in 2021 was unremarkable. No blood thinners. No family history of GI malignancies. No significant abdominal surgeries.    Current Facility-Administered Medications:    0.9 %  sodium chloride infusion, , Intravenous, Continuous, Kaidence Sant, Rossie Muskrat, MD, Last Rate: 20 mL/hr at 12/20/22 1247, 1,000 mL at 12/20/22 1247  Medications Prior to Admission  Medication Sig Dispense Refill Last Dose   Alpha-Lipoic Acid 600 MG TABS Take 600 mg by mouth daily.   Past Week   aspirin 81 MG EC tablet Take 81 mg by mouth daily.   Past Week   Biotin 5 MG TBDP Take 5 mg by mouth daily.   Past Week   CALCIUM-VITAMIN D PO Take 1 tablet by mouth daily.   Past Week   L-Lysine 500 MG TABS Take 1,000 mg by mouth 4 (four) times daily as needed (mouth sores).   12/19/2022   Lutein 10 MG TABS Take 10 mg by mouth daily.   Past Week   rosuvastatin (CRESTOR) 5 MG tablet Take 5 mg by mouth every other day. MORNING   Past Week   Vitamin D, Ergocalciferol, (DRISDOL) 1.25 MG (50000 UNIT) CAPS capsule Take 50,000 Units by mouth every Sunday.   Past Week   ibuprofen (ADVIL,MOTRIN) 200 MG tablet Take 400 mg by mouth every 8 (eight) hours as needed for moderate pain.      losartan-hydrochlorothiazide (HYZAAR) 50-12.5 MG tablet Take 0.5 tablets by mouth every morning.        Allergies  Allergen Reactions   Mold Extract [Trichophyton] Other (See Comments)    Allergy to it.    Codeine     "Feels bad"   Indomethacin     "Bad dreams"   Other     Sodium Laurel Sulfate- caused gums to peel, corners of mouth became sore   Sulfa Antibiotics Hives    Feels terrible    Cashew Nut (Anacardium Occidentale) Skin Test Rash    Nickel Rash   Sulfamethoxazole-Trimethoprim Rash     Past Medical History:  Diagnosis Date   Arthritis    Breast cancer    Bronchitis    Cancer    basal cell   Change in bowel habits    Colon polyps    Femur fracture, left    Hepatitis    as a child   Hyperlipidemia    Hypertension    controlled with medication;    Hypothyroidism    h/o   MGUS (monoclonal gammopathy of unknown significance)    Osteoporosis    hasn't been checked in a while; unsure how severe;    Personal history of radiation therapy    Postmenopausal    Rectal bleeding    Seasonal allergies    Sleep apnea    does not use cpap   Stroke ??   pt unaware but was told she had stroke possibly after fall in 2016-has occ balance issues   Vitamin D deficiency     Review of systems:  Otherwise negative.    Physical Exam  Gen: Alert, oriented. Appears stated age.  HEENT: PERRLA. Lungs: No respiratory distress CV: RRR Abd: soft, benign, no masses Ext: No edema    Planned procedures: Proceed with  colonoscopy. The patient understands the nature of the planned procedure, indications, risks, alternatives and potential complications including but not limited to bleeding, infection, perforation, damage to internal organs and possible oversedation/side effects from anesthesia. The patient agrees and gives consent to proceed.  Please refer to procedure notes for findings, recommendations and patient disposition/instructions.     Lesly Rubenstein, MD [facility]

## 2022-12-20 NOTE — Transfer of Care (Signed)
Immediate Anesthesia Transfer of Care Note  Patient: Kristin Wise  Procedure(s) Performed: COLONOSCOPY WITH PROPOFOL  Patient Location: PACU  Anesthesia Type:General  Level of Consciousness: awake and sedated  Airway & Oxygen Therapy: Patient Spontanous Breathing and Patient connected to nasal cannula oxygen  Post-op Assessment: Report given to RN and Post -op Vital signs reviewed and stable  Post vital signs: Reviewed and stable  Last Vitals:  Vitals Value Taken Time  BP    Temp    Pulse    Resp    SpO2      Last Pain:  Vitals:   12/20/22 1216  TempSrc: Temporal  PainSc: 0-No pain         Complications: There were no known notable events for this encounter.

## 2022-12-21 ENCOUNTER — Encounter: Payer: Self-pay | Admitting: Gastroenterology

## 2022-12-21 LAB — SURGICAL PATHOLOGY

## 2022-12-29 DIAGNOSIS — R5381 Other malaise: Secondary | ICD-10-CM | POA: Diagnosis not present

## 2022-12-29 DIAGNOSIS — I10 Essential (primary) hypertension: Secondary | ICD-10-CM | POA: Diagnosis not present

## 2022-12-29 DIAGNOSIS — R5383 Other fatigue: Secondary | ICD-10-CM | POA: Diagnosis not present

## 2022-12-29 DIAGNOSIS — E559 Vitamin D deficiency, unspecified: Secondary | ICD-10-CM | POA: Diagnosis not present

## 2023-01-02 DIAGNOSIS — H903 Sensorineural hearing loss, bilateral: Secondary | ICD-10-CM | POA: Diagnosis not present

## 2023-01-02 DIAGNOSIS — H6983 Other specified disorders of Eustachian tube, bilateral: Secondary | ICD-10-CM | POA: Diagnosis not present

## 2023-01-02 DIAGNOSIS — H6121 Impacted cerumen, right ear: Secondary | ICD-10-CM | POA: Diagnosis not present

## 2023-01-05 DIAGNOSIS — I10 Essential (primary) hypertension: Secondary | ICD-10-CM | POA: Diagnosis not present

## 2023-01-05 DIAGNOSIS — E785 Hyperlipidemia, unspecified: Secondary | ICD-10-CM | POA: Diagnosis not present

## 2023-01-05 DIAGNOSIS — R7301 Impaired fasting glucose: Secondary | ICD-10-CM | POA: Diagnosis not present

## 2023-01-05 DIAGNOSIS — Z0000 Encounter for general adult medical examination without abnormal findings: Secondary | ICD-10-CM | POA: Diagnosis not present

## 2023-01-05 DIAGNOSIS — E559 Vitamin D deficiency, unspecified: Secondary | ICD-10-CM | POA: Diagnosis not present

## 2023-02-15 ENCOUNTER — Other Ambulatory Visit: Payer: Medicare HMO

## 2023-03-06 ENCOUNTER — Ambulatory Visit
Admission: RE | Admit: 2023-03-06 | Discharge: 2023-03-06 | Disposition: A | Discharge status: Home / Self Care | Payer: Medicare HMO | Source: Ambulatory Visit | Attending: Radiation Oncology | Admitting: Radiation Oncology

## 2023-03-06 ENCOUNTER — Encounter: Payer: Self-pay | Admitting: Radiation Oncology

## 2023-03-06 VITALS — BP 141/75 | HR 69 | Resp 14 | Ht 62.0 in | Wt 152.4 lb

## 2023-03-06 DIAGNOSIS — C50411 Malignant neoplasm of upper-outer quadrant of right female breast: Secondary | ICD-10-CM | POA: Insufficient documentation

## 2023-03-06 DIAGNOSIS — Z79811 Long term (current) use of aromatase inhibitors: Secondary | ICD-10-CM | POA: Diagnosis not present

## 2023-03-06 DIAGNOSIS — Z170 Estrogen receptor positive status [ER+]: Secondary | ICD-10-CM | POA: Diagnosis not present

## 2023-03-06 DIAGNOSIS — Z923 Personal history of irradiation: Secondary | ICD-10-CM | POA: Insufficient documentation

## 2023-03-06 NOTE — Progress Notes (Signed)
Radiation Oncology Follow up Note  Name: Kristin Wise   Date:   03/06/2023 MRN:  [id number] DOB: 1940/12/15    This 82 y.o. female presents to the clinic today for 1 year follow-up status post whole breast radiation to her right breast for stage Ia ER positive invasive lobular carcinoma.  REFERRING PROVIDER: Jerl Mina, MD  HPI: Patient is a 82 year old.  Female now out 1 year having completed whole breast radiation to her right breast for stage Ia ER positive invasive lobular carcinoma.  Seen today in routine follow-up she is doing well.  She still has some occasional tenderness in the breast most likely secondary to scarring.  Otherwise is without complaint.  Patient had mammograms back in March which were BI-RADS Category 2 benign which I have reviewed.  Patient has not been on endocrine therapy.  COMPLICATIONS OF TREATMENT: none  FOLLOW UP COMPLIANCE: keeps appointments   PHYSICAL EXAM:  BP (!) 141/75   Pulse 69   Resp 14   Ht 5\' 2"  (1.575 m)   Wt 152 lb 6.4 oz (69.1 kg)   BMI 27.87 kg/m  Lungs are clear to A&P cardiac examination essentially unremarkable with regular rate and rhythm. No dominant mass or nodularity is noted in either breast in 2 positions examined. Incision is well-healed. No axillary or supraclavicular adenopathy is appreciated. Cosmetic result is excellent.  Well-developed well-nourished patient in NAD. HEENT reveals PERLA, EOMI, discs not visualized.  Oral cavity is clear. No oral mucosal lesions are identified. Neck is clear without evidence of cervical or supraclavicular adenopathy. Lungs are clear to A&P. Cardiac examination is essentially unremarkable with regular rate and rhythm without murmur rub or thrill. Abdomen is benign with no organomegaly or masses noted. Motor sensory and DTR levels are equal and symmetric in the upper and lower extremities. Cranial nerves II through XII are grossly intact. Proprioception is intact. No peripheral adenopathy or  edema is identified. No motor or sensory levels are noted. Crude visual fields are within normal range.  RADIOLOGY RESULTS: Mammograms reviewed compatible with above-stated findings  PLAN: Present time patient is doing well now out 1 year with no evidence of disease.  And pleased with her overall progress.  I have asked to see her back in 1 year for follow-up and then will discontinue follow-up care.  Patient is to call with any concerns.  I would like to take this opportunity to thank you for allowing me to participate in the care of your patient.Carmina Miller, MD

## 2023-03-23 DIAGNOSIS — M25551 Pain in right hip: Secondary | ICD-10-CM | POA: Diagnosis not present

## 2023-03-23 DIAGNOSIS — Z0000 Encounter for general adult medical examination without abnormal findings: Secondary | ICD-10-CM | POA: Diagnosis not present

## 2023-03-23 DIAGNOSIS — H8110 Benign paroxysmal vertigo, unspecified ear: Secondary | ICD-10-CM | POA: Diagnosis not present

## 2023-03-23 DIAGNOSIS — I10 Essential (primary) hypertension: Secondary | ICD-10-CM | POA: Diagnosis not present

## 2023-03-23 DIAGNOSIS — E785 Hyperlipidemia, unspecified: Secondary | ICD-10-CM | POA: Diagnosis not present

## 2023-03-23 DIAGNOSIS — H6991 Unspecified Eustachian tube disorder, right ear: Secondary | ICD-10-CM | POA: Diagnosis not present

## 2023-03-24 DIAGNOSIS — D2271 Melanocytic nevi of right lower limb, including hip: Secondary | ICD-10-CM | POA: Diagnosis not present

## 2023-03-24 DIAGNOSIS — Z08 Encounter for follow-up examination after completed treatment for malignant neoplasm: Secondary | ICD-10-CM | POA: Diagnosis not present

## 2023-03-24 DIAGNOSIS — D2262 Melanocytic nevi of left upper limb, including shoulder: Secondary | ICD-10-CM | POA: Diagnosis not present

## 2023-03-24 DIAGNOSIS — Z85828 Personal history of other malignant neoplasm of skin: Secondary | ICD-10-CM | POA: Diagnosis not present

## 2023-03-24 DIAGNOSIS — D2272 Melanocytic nevi of left lower limb, including hip: Secondary | ICD-10-CM | POA: Diagnosis not present

## 2023-03-24 DIAGNOSIS — D225 Melanocytic nevi of trunk: Secondary | ICD-10-CM | POA: Diagnosis not present

## 2023-03-24 DIAGNOSIS — D2261 Melanocytic nevi of right upper limb, including shoulder: Secondary | ICD-10-CM | POA: Diagnosis not present

## 2023-03-24 DIAGNOSIS — L821 Other seborrheic keratosis: Secondary | ICD-10-CM | POA: Diagnosis not present

## 2023-04-27 DIAGNOSIS — N6314 Unspecified lump in the right breast, lower inner quadrant: Secondary | ICD-10-CM | POA: Diagnosis not present

## 2023-05-03 ENCOUNTER — Other Ambulatory Visit: Payer: Self-pay | Admitting: Family Medicine

## 2023-05-03 DIAGNOSIS — N644 Mastodynia: Secondary | ICD-10-CM

## 2023-05-09 ENCOUNTER — Inpatient Hospital Stay: Payer: Medicare HMO | Admitting: Internal Medicine

## 2023-05-11 DIAGNOSIS — C50411 Malignant neoplasm of upper-outer quadrant of right female breast: Secondary | ICD-10-CM | POA: Diagnosis not present

## 2023-05-11 DIAGNOSIS — D472 Monoclonal gammopathy: Secondary | ICD-10-CM | POA: Diagnosis not present

## 2023-05-12 ENCOUNTER — Ambulatory Visit
Admission: RE | Admit: 2023-05-12 | Discharge: 2023-05-12 | Disposition: A | Discharge status: Home / Self Care | Payer: Medicare HMO | Source: Ambulatory Visit | Attending: Family Medicine | Admitting: Family Medicine

## 2023-05-12 ENCOUNTER — Other Ambulatory Visit: Payer: Medicare HMO

## 2023-05-12 DIAGNOSIS — R92333 Mammographic heterogeneous density, bilateral breasts: Secondary | ICD-10-CM | POA: Diagnosis not present

## 2023-05-12 DIAGNOSIS — N644 Mastodynia: Secondary | ICD-10-CM | POA: Insufficient documentation

## 2023-05-12 DIAGNOSIS — N6310 Unspecified lump in the right breast, unspecified quadrant: Secondary | ICD-10-CM | POA: Diagnosis not present

## 2023-05-17 DIAGNOSIS — H26491 Other secondary cataract, right eye: Secondary | ICD-10-CM | POA: Diagnosis not present

## 2023-05-17 DIAGNOSIS — M3501 Sicca syndrome with keratoconjunctivitis: Secondary | ICD-10-CM | POA: Diagnosis not present

## 2023-05-17 DIAGNOSIS — H02889 Meibomian gland dysfunction of unspecified eye, unspecified eyelid: Secondary | ICD-10-CM | POA: Diagnosis not present

## 2023-05-17 DIAGNOSIS — H43813 Vitreous degeneration, bilateral: Secondary | ICD-10-CM | POA: Diagnosis not present

## 2023-05-22 ENCOUNTER — Encounter: Payer: Self-pay | Admitting: Internal Medicine

## 2023-05-26 ENCOUNTER — Encounter: Payer: Self-pay | Admitting: Internal Medicine

## 2023-05-26 ENCOUNTER — Inpatient Hospital Stay: Payer: Medicare HMO | Attending: Internal Medicine | Admitting: Internal Medicine

## 2023-05-26 DIAGNOSIS — Z923 Personal history of irradiation: Secondary | ICD-10-CM | POA: Diagnosis not present

## 2023-05-26 DIAGNOSIS — Z803 Family history of malignant neoplasm of breast: Secondary | ICD-10-CM | POA: Insufficient documentation

## 2023-05-26 DIAGNOSIS — C50411 Malignant neoplasm of upper-outer quadrant of right female breast: Secondary | ICD-10-CM

## 2023-05-26 DIAGNOSIS — M549 Dorsalgia, unspecified: Secondary | ICD-10-CM | POA: Diagnosis not present

## 2023-05-26 DIAGNOSIS — Z8051 Family history of malignant neoplasm of kidney: Secondary | ICD-10-CM | POA: Diagnosis not present

## 2023-05-26 DIAGNOSIS — Z8042 Family history of malignant neoplasm of prostate: Secondary | ICD-10-CM | POA: Insufficient documentation

## 2023-05-26 DIAGNOSIS — R0602 Shortness of breath: Secondary | ICD-10-CM | POA: Insufficient documentation

## 2023-05-26 DIAGNOSIS — R232 Flushing: Secondary | ICD-10-CM | POA: Diagnosis not present

## 2023-05-26 DIAGNOSIS — M2550 Pain in unspecified joint: Secondary | ICD-10-CM | POA: Insufficient documentation

## 2023-05-26 DIAGNOSIS — Z170 Estrogen receptor positive status [ER+]: Secondary | ICD-10-CM

## 2023-05-26 DIAGNOSIS — Z800 Family history of malignant neoplasm of digestive organs: Secondary | ICD-10-CM | POA: Insufficient documentation

## 2023-05-26 DIAGNOSIS — R5383 Other fatigue: Secondary | ICD-10-CM | POA: Insufficient documentation

## 2023-05-26 DIAGNOSIS — D472 Monoclonal gammopathy: Secondary | ICD-10-CM | POA: Diagnosis not present

## 2023-05-26 NOTE — Progress Notes (Signed)
Had a colonoscopy since last visit.  Had a mammogram this month, she found a lump in right breast, Kristin Wise.  SOB with activity.  C/o hot flashes, started recently.  Had bright red blood in panties within this week, not sure where it came from.  C/o fatigue.  Can the M spikes jump suddenly and why?  Swelling in right foot.  Can her MGUS turn into multiple myeloma?

## 2023-05-26 NOTE — Progress Notes (Signed)
[facility] OFFICE PROGRESS NOTE  Patient Care Team: Jerl Mina, [name] Earna Coder, MD as Consulting Physician (Oncology) Carmina Miller, MD as Consulting Physician (Radiation Oncology)   SUMMARY OF ONCOLOGIC HISTORY: Breast cancer    Oncology History Overview Note  # JAN 2023- # RIGHT breast [facility] with lobular feathureds ER-POSITIVE/PR-NEG; her 2-2+IHC; FISH-PENIDNG; G-1. JAN 2023- MRI-MRI shows 1.8 cm right upper upper quadrant mass [s/p recent biopsy; Dr.Byrnett].DIAGNOSIS:  A. BREAST, RIGHT UPPER OUTER QUADRANT; EXCISION:  - INVASIVE LOBULAR CARCINOMA.  - CLIP AND BIOPSY SITE IDENTIFIED.  - SEE CANCER SUMMARY.   Comment:  Multiple additional deeper recut levels were examined, due to disruption  of tissue in the initial slides demonstrating tumor. Invasive carcinoma  measures up to 9 mm in greatest linear extent, although there is dense  fibrosis surrounding the invasive carcinoma, which may account for the  discrepancy between the microscopic measurement, and the radiographic  and gross measurements.   B. LYMPH NODES, RIGHT SENTINEL #1, #2, #3, AND #4; EXCISION:  - FOUR LYMPH NODES, NEGATIVE FOR MALIGNANCY (0/4).   Comment:  Immunohistochemical studies directed against cytokeratin AE1/AE3 are  performed on all 4 tissue blocks, and are negative for metastatic  carcinoma   C. BREAST, RIGHT, ADDITIONAL ANTERIOR/INFERIOR MARGIN; EXCISION:  - BENIGN MAMMARY PARENCHYMA WITH FIBROCYSTIC AND APOCRINE CHANGES,  COLUMNAR CELL CHANGE WITHOUT ATYPIA, USUAL DUCTAL HYPERPLASIA, AND FOCAL  SCLEROSING ADENOSIS.  - FOCAL CHANGES SUGGESTIVE OF BENIGN INTRADUCTAL PAPILLOMA.  - NEGATIVE FOR RESIDUAL INVASIVE LOBULAR CARCINOMA AND OTHER ATYPICAL  PROLIFERATIVE BREAST DISEASE.   CANCER CASE SUMMARY: INVASIVE CARCINOMA OF THE BREAST  Standard(s): AJCC-UICC 8   SPECIMEN  Procedure: Excision  Specimen Laterality: Right   TUMOR   Histologic Type: Invasive lobular carcinoma  Histologic Grade (Nottingham Histologic Score)                       Glandular (Acinar)/Tubular Differentiation: 3                       Nuclear Pleomorphism: 1                       Mitotic Rate: 1                       Overall Grade: Grade 1  Tumor Size: 9 mm  Tumor Focality: Single focus of invasive carcinoma  Tumor Extent: Not applicable  Ductal Carcinoma In Situ (DCIS): Not identified  Lymphatic and/or Vascular Invasion: Not identified  Treatment Effect in the Breast: No known presurgical therapy   MARGINS  Margin Status for Invasive Carcinoma: All margins negative for invasive  carcinoma                       Distance from closest margin: 3 mm                       Specify closest margin: Posterior/deep   Margin Status for DCIS: Not applicable (no DCIS in specimen)   #Stage I ER/PR positive HER2 negative breast cancer pT1b grade 1; no Oncotype done.  S/p lumpectomy  #Dec 28, 2021-s/p radiation; May  17th, 2023-declined AI    July 2016-M-protein-IgA Lamda- MGUS: NOV 2016- M spike- 1gm/dl; IgA- 1898 [total]; K/L=N; AUG 2016- Skeletal survey-Normal;  # Stroke- ? [Dr.Shah; 2018]   Carcinoma of  upper-outer quadrant of right breast in female, estrogen receptor positive (HCC)  10/01/2021 Initial Diagnosis   Carcinoma of upper-outer quadrant of right breast in female, estrogen receptor positive (HCC)   11/10/2021 Cancer Staging   Staging form: Breast, AJCC 8th Edition - Clinical: Stage IA (cT1b, cN0, cM0, G1, ER+, PR-, HER2-) - Signed by Earna Coder, MD on 11/10/2021 Histologic grading system: 3 grade system    INTERVAL HISTORY: Alone.  Ambulating independently.  82 year old female patient with diagnosis of stage I breast cancer lobular ER positive PR negative HER2 negative s/p lumpectomy s/p RT; declined adjuvant AI is   here for follow-up; is here to review the results of MGUS work up.  Patient dad a colonoscopy since  last visit. Patient had bright red blood in panties within this week, not sure where it came from. Denies any vaginal or urinary or rectal bleeding.     Patient has chronic SOB with activity. Denies any new shortness of breath or cough.  Denies any new symptoms.C/o hot flashes, started recently.   Review of Systems  Constitutional:  Positive for malaise/fatigue. Negative for chills, diaphoresis, fever and weight loss.  HENT:  Negative for nosebleeds and sore throat.   Eyes:  Negative for double vision.  Respiratory:  Negative for cough, hemoptysis, sputum production, shortness of breath and wheezing.   Cardiovascular:  Negative for chest pain, palpitations, orthopnea and leg swelling.  Gastrointestinal:  Negative for abdominal pain, blood in stool, constipation, diarrhea, heartburn, melena, nausea and vomiting.  Genitourinary:  Negative for dysuria, frequency and urgency.  Musculoskeletal:  Positive for back pain and joint pain.  Skin: Negative.  Negative for itching and rash.  Neurological:  Negative for dizziness, tingling, focal weakness, weakness and headaches.  Endo/Heme/Allergies:  Does not bruise/bleed easily.  Psychiatric/Behavioral:  Negative for depression. The patient is not nervous/anxious and does not have insomnia.      PAST MEDICAL HISTORY :  Past Medical History:  Diagnosis Date   Arthritis    Breast cancer (HCC)    Bronchitis    Cancer (HCC)    basal cell   Change in bowel habits    Colon polyps    Femur fracture, left (HCC)    Hepatitis    as a child   Hyperlipidemia    Hypertension    controlled with medication;    Hypothyroidism    h/o   MGUS (monoclonal gammopathy of unknown significance)    Osteoporosis    hasn't been checked in a while; unsure how severe;    Personal history of radiation therapy    Postmenopausal    Rectal bleeding    Seasonal allergies    Sleep apnea    does not use cpap   Stroke [facility]) ??   pt unaware but was told she had stroke  possibly after fall in 2016-has occ balance issues   Vitamin D deficiency     PAST SURGICAL HISTORY :   Past Surgical History:  Procedure Laterality Date   AXILLARY LYMPH NODE BIOPSY Right 11/01/2021   Procedure: AXILLARY LYMPH NODE BIOPSY;  Surgeon: Earline Mayotte, MD;  Location: [facility];  Service: General;  Laterality: Right;   BREAST BIOPSY Left 09/30/2008   Patient presented with acute swelling in the breast, aspiration culture negative.  Subsequent core biopsy showing fragments of cyst wall with granulation tissue, fibrosis and focal residual epithelial lining.  No malignancy.   BREAST BIOPSY Right 09/22/2021   ULTRASOUND-GUIDED BIOPSY: - INVASIVE MAMMARY CARCINOMA WITH  LOBULAR FEATURES   BREAST CYST ASPIRATION     BREAST LUMPECTOMY Right 11/01/2021   BREAST LUMPECTOMY WITH NEEDLE LOCALIZATION Right 11/01/2021   Procedure: BREAST LUMPECTOMY WITH NEEDLE LOCALIZATION;  Surgeon: Earline Mayotte, MD;  Location: [facility];  Service: General;  Laterality: Right;   BREAST SURGERY Right 11/01/2021   wire loc w/ sn for lumpectomy   CATARACT EXTRACTION W/PHACO Left 03/02/2021   Procedure: CATARACT EXTRACTION PHACO AND INTRAOCULAR LENS PLACEMENT (IOC) LEFT 6.40 00:35.2;  Surgeon: Galen Manila, MD;  Location: [facility];  Service: Ophthalmology;  Laterality: Left;   CATARACT EXTRACTION W/PHACO Right 03/16/2021   Procedure: CATARACT EXTRACTION PHACO AND INTRAOCULAR LENS PLACEMENT (IOC) RIGHT;  Surgeon: Galen Manila, MD;  Location: [facility];  Service: Ophthalmology;  Laterality: Right;  7.42 0:51.0   COLONOSCOPY WITH PROPOFOL N/A 02/05/2015   Procedure: COLONOSCOPY WITH PROPOFOL;  Surgeon: Scot Jun, MD;  Location: [facility];  Service: Endoscopy;  Laterality: N/A;   COLONOSCOPY WITH PROPOFOL N/A 04/08/2020   Procedure: COLONOSCOPY WITH PROPOFOL;  Surgeon: Toledo, Boykin Nearing, MD;  Location: [facility] ENDOSCOPY;  Service: Gastroenterology;  Laterality: N/A;    COLONOSCOPY WITH PROPOFOL N/A 12/20/2022   Procedure: COLONOSCOPY WITH PROPOFOL;  Surgeon: [name], MD;  Location: [facility] END[facility]COPY;  Service: Endoscopy;  Laterality: N/A;   EYE SURGERY     FEMUR FRACTURE SURGERY Left 04/18/2015   FRACTURE SURGERY     TONSILLECTOMY      FAMILY HISTORY :   Family History  Problem Relation Age of Onset   Breast cancer Cousin        paternal   Kidney cancer Cousin        maternal   Leukemia Cousin        maternal   Stomach cancer Other        uncle   Prostate cancer Other        grandfather   Skin cancer Other        maternal grandparents    SOCIAL HISTORY:   Social History   Tobacco Use   Smoking status: Never   Smokeless tobacco: Never  Vaping Use   Vaping status: Never Used  Substance Use Topics   Alcohol use: No   Drug use: No    ALLERGIES:  is allergic to mold extract [trichophyton], codeine, indomethacin, other, sulfa antibiotics, cashew nut (anacardium occidentale) skin test, nickel, and sulfamethoxazole-trimethoprim.  MEDICATIONS:  Current Outpatient Medications  Medication Sig Dispense Refill   Alpha-Lipoic Acid 600 MG TABS Take 600 mg by mouth daily.     aspirin 81 MG EC tablet Take 81 mg by mouth daily.     Biotin 5 MG TBDP Take 5 mg by mouth daily.     CALCIUM-VITAMIN D PO Take 1 tablet by mouth daily.     L-Lysine 500 MG TABS Take 1,000 mg by mouth 4 (four) times daily as needed (mouth sores).     Lutein 10 MG TABS Take 10 mg by mouth daily.     polyethylene glycol-electrolytes (NULYTELY) 420 g solution Take 4,000 mLs by mouth once.     Vitamin D, Ergocalciferol, (DRISDOL) 1.25 MG (50000 UNIT) CAPS capsule Take 50,000 Units by mouth every Sunday.     rosuvastatin (CRESTOR) 5 MG tablet Take 5 mg by mouth every other day. MORNING (Patient not taking: Reported on 03/06/2023)     No current facility-administered medications for this visit.    PHYSICAL EXAMINATION:   BP (!) 153/61 (BP Location: Left  Arm,  Patient Position: Sitting, Cuff Size: Normal)   Pulse 71   Temp 98.5 F (36.9 C) (Tympanic)   Ht 5\' 2"  (1.575 m)   Wt 150 lb 6.4 oz (68.2 kg)   SpO2 100%   BMI 27.51 kg/m   Filed Weights   05/26/23 1405  Weight: 150 lb 6.4 oz (68.2 kg)     Physical Exam HENT:     Head: Normocephalic and atraumatic.     Mouth/Throat:     Pharynx: No oropharyngeal exudate.  Eyes:     Pupils: Pupils are equal, round, and reactive to light.  Cardiovascular:     Rate and Rhythm: Normal rate and regular rhythm.  Pulmonary:     Effort: No respiratory distress.     Breath sounds: No wheezing.  Abdominal:     General: Bowel sounds are normal. There is no distension.     Palpations: Abdomen is soft. There is no mass.     Tenderness: There is no abdominal tenderness. There is no guarding or rebound.  Musculoskeletal:        General: No tenderness. Normal range of motion.     Cervical back: Normal range of motion and neck supple.  Skin:    General: Skin is warm.  Neurological:     Mental Status: She is alert and oriented to person, place, and time.  Psychiatric:        Mood and Affect: Affect normal.      LABORATORY DATA:  I have reviewed the data as listed    Component Value Date/Time   NA 140 09/05/2018 0730   NA 141 09/29/2011 1424   K 4.1 10/26/2021 1355   K 3.9 09/29/2011 1424   CL 107 09/05/2018 0730   CL 100 09/29/2011 1424   CO2 25 09/05/2018 0730   CO2 25 09/29/2011 1424   GLUCOSE 125 (H) 09/05/2018 0730   GLUCOSE 172 (H) 09/29/2011 1424   BUN 23 09/05/2018 0730   BUN 19 (H) 09/29/2011 1424   CREATININE 0.80 03/30/2022 1413   CREATININE 0.79 09/29/2011 1424   CALCIUM 9.0 09/05/2018 0730   CALCIUM 10.1 09/29/2011 1424   PROT 7.3 09/05/2018 0730   PROT 8.5 (H) 09/29/2011 1424   ALBUMIN 3.5 09/05/2018 0730   ALBUMIN 4.1 09/29/2011 1424   AST 30 09/05/2018 0730   AST 24 09/29/2011 1424   ALT 22 09/05/2018 0730   ALT 12 09/29/2011 1424   ALKPHOS 83 09/05/2018 0730    ALKPHOS 76 09/29/2011 1424   BILITOT 0.8 09/05/2018 0730   BILITOT 0.8 09/29/2011 1424   GFRNONAA >60 09/05/2018 0730   GFRNONAA >60 09/29/2011 1424   GFRAA >60 09/05/2018 0730   GFRAA >60 09/29/2011 1424    No results found for: "SPEP", "UPEP"  Lab Results  Component Value Date   WBC 6.5 09/05/2018   NEUTROABS 4.2 09/05/2018   HGB 10.9 (L) 09/05/2018   HCT 34.1 (L) 09/05/2018   MCV 89.3 09/05/2018   PLT 242 09/05/2018      Chemistry      Component Value Date/Time   NA 140 09/05/2018 0730   NA 141 09/29/2011 1424   K 4.1 10/26/2021 1355   K 3.9 09/29/2011 1424   CL 107 09/05/2018 0730   CL 100 09/29/2011 1424   CO2 25 09/05/2018 0730   CO2 25 09/29/2011 1424   BUN 23 09/05/2018 0730   BUN 19 (H) 09/29/2011 1424   CREATININE 0.80 03/30/2022 1413   CREATININE 0.79  09/29/2011 1424      Component Value Date/Time   CALCIUM 9.0 09/05/2018 0730   CALCIUM 10.1 09/29/2011 1424   ALKPHOS 83 09/05/2018 0730   ALKPHOS 76 09/29/2011 1424   AST 30 09/05/2018 0730   AST 24 09/29/2011 1424   ALT 22 09/05/2018 0730   ALT 12 09/29/2011 1424   BILITOT 0.8 09/05/2018 0730   BILITOT 0.8 09/29/2011 1424        ASSESSMENT & PLAN:   Carcinoma of upper-outer quadrant of right breast in female, estrogen receptor positive (HCC) # RIGHT INVASIVE LOBULAR CANCER- ER-POSITIVE/PR-NEG; her 2-NEGATIVE;  [s/p lumpectomy; margins 3 mm].  T1b; grade 1.  S/p WBRT [s/p RT may 2nd 2023]. DECLINED any endocrine adjuvant therapy.  Clinically stable.  Reviewed SEP  2024- Bilateral diagnostic mammogram- WNL: will repeat mammo (with RIGHT and LEFT breast ultrasound if deemed necessary) in March 2025-Dr.Cintron]  # IgA lambda MGUS [since 2016]- AUG  2023- IgA1.1- kappa/lamda-N [[facility]]; SEP 2024-CBC/CMP-N.  K/L= WNL; M protein1.3 . Monitor for now.  Again reviewed the low/risk of transformation into multiple myeloma at this time.  Continue stable at this time every 6 months.  Multiple questions  answered.   # Vaccinations: COVID/RSV-flu/defer to Pharmacy; no contraindications from oncology standpoint.  # DISPOSITION:  # follow-up in 6 month-MD;[facility]- 2 weeks prior- labs- cbc/cmp;iron studies; ferritin- MM panel; K/L light chain ratio-Dr.B  Cc- Dr.Hedrick-      Earna Coder, MD 05/26/2023 3:32 PM

## 2023-05-26 NOTE — Assessment & Plan Note (Addendum)
#   RIGHT INVASIVE LOBULAR CANCER- ER-POSITIVE/PR-NEG; her 2-NEGATIVE;  [s/p lumpectomy; margins 3 mm].  T1b; grade 1.  S/p WBRT [s/p RT may 2nd 2023]. DECLINED any endocrine adjuvant therapy.  Clinically stable.  Reviewed SEP  2024- Bilateral diagnostic mammogram- WNL: will repeat mammo (with RIGHT and LEFT breast ultrasound if deemed necessary) in March 2025-Dr.Cintron]  # IgA lambda MGUS [since 2016]- AUG  2023- IgA1.1- kappa/lamda-N [[facility]]; SEP 2024-CBC/CMP-N.  K/L= WNL; M protein1.3 . Monitor for now.  Again reviewed the low/risk of transformation into multiple myeloma at this time.  Continue stable at this time every 6 months.  Multiple questions answered.   # Vaccinations: COVID/RSV-flu/defer to Pharmacy; no contraindications from oncology standpoint.  # DISPOSITION:  # follow-up in 6 month-MD;[facility]- 2 weeks prior- labs- cbc/cmp;iron studies; ferritin- MM panel; K/L light chain ratio-Dr.B  Cc- Dr.Hedrick-

## 2023-06-05 DIAGNOSIS — R7303 Prediabetes: Secondary | ICD-10-CM | POA: Diagnosis not present

## 2023-06-05 DIAGNOSIS — E785 Hyperlipidemia, unspecified: Secondary | ICD-10-CM | POA: Diagnosis not present

## 2023-06-05 DIAGNOSIS — I10 Essential (primary) hypertension: Secondary | ICD-10-CM | POA: Diagnosis not present

## 2023-06-05 DIAGNOSIS — E559 Vitamin D deficiency, unspecified: Secondary | ICD-10-CM | POA: Diagnosis not present

## 2023-07-21 DIAGNOSIS — N950 Postmenopausal bleeding: Secondary | ICD-10-CM | POA: Diagnosis not present

## 2023-07-21 DIAGNOSIS — R87610 Atypical squamous cells of undetermined significance on cytologic smear of cervix (ASC-US): Secondary | ICD-10-CM | POA: Diagnosis not present

## 2023-07-21 DIAGNOSIS — N858 Other specified noninflammatory disorders of uterus: Secondary | ICD-10-CM | POA: Diagnosis not present

## 2023-07-21 DIAGNOSIS — Z124 Encounter for screening for malignant neoplasm of cervix: Secondary | ICD-10-CM | POA: Diagnosis not present

## 2023-08-01 DIAGNOSIS — N950 Postmenopausal bleeding: Secondary | ICD-10-CM | POA: Diagnosis not present

## 2023-08-01 DIAGNOSIS — N362 Urethral caruncle: Secondary | ICD-10-CM | POA: Diagnosis not present

## 2023-08-15 DIAGNOSIS — H6123 Impacted cerumen, bilateral: Secondary | ICD-10-CM | POA: Diagnosis not present

## 2023-08-15 DIAGNOSIS — H8112 Benign paroxysmal vertigo, left ear: Secondary | ICD-10-CM | POA: Diagnosis not present

## 2023-09-20 ENCOUNTER — Other Ambulatory Visit: Payer: Self-pay | Admitting: General Surgery

## 2023-09-20 DIAGNOSIS — Z853 Personal history of malignant neoplasm of breast: Secondary | ICD-10-CM

## 2023-11-08 ENCOUNTER — Other Ambulatory Visit: Payer: Medicare HMO

## 2023-11-08 ENCOUNTER — Ambulatory Visit
Admission: RE | Admit: 2023-11-08 | Discharge: 2023-11-08 | Disposition: A | Discharge status: Home / Self Care | Payer: Medicare HMO | Source: Ambulatory Visit | Attending: General Surgery | Admitting: General Surgery

## 2023-11-08 DIAGNOSIS — Z853 Personal history of malignant neoplasm of breast: Secondary | ICD-10-CM | POA: Diagnosis present

## 2023-11-17 ENCOUNTER — Encounter: Payer: Self-pay | Admitting: Internal Medicine

## 2023-11-24 ENCOUNTER — Encounter: Payer: Self-pay | Admitting: Internal Medicine

## 2023-11-24 ENCOUNTER — Inpatient Hospital Stay: Payer: Medicare HMO | Attending: Internal Medicine | Admitting: Internal Medicine

## 2023-11-24 VITALS — BP 153/51 | HR 57 | Temp 98.9°F | Resp 20 | Ht 62.0 in | Wt 146.4 lb

## 2023-11-24 DIAGNOSIS — Z803 Family history of malignant neoplasm of breast: Secondary | ICD-10-CM | POA: Insufficient documentation

## 2023-11-24 DIAGNOSIS — R5383 Other fatigue: Secondary | ICD-10-CM | POA: Diagnosis not present

## 2023-11-24 DIAGNOSIS — Z800 Family history of malignant neoplasm of digestive organs: Secondary | ICD-10-CM | POA: Diagnosis not present

## 2023-11-24 DIAGNOSIS — C50411 Malignant neoplasm of upper-outer quadrant of right female breast: Secondary | ICD-10-CM | POA: Diagnosis present

## 2023-11-24 DIAGNOSIS — Z923 Personal history of irradiation: Secondary | ICD-10-CM | POA: Diagnosis not present

## 2023-11-24 DIAGNOSIS — Z8042 Family history of malignant neoplasm of prostate: Secondary | ICD-10-CM | POA: Insufficient documentation

## 2023-11-24 DIAGNOSIS — Z8051 Family history of malignant neoplasm of kidney: Secondary | ICD-10-CM | POA: Insufficient documentation

## 2023-11-24 DIAGNOSIS — Z170 Estrogen receptor positive status [ER+]: Secondary | ICD-10-CM | POA: Diagnosis not present

## 2023-11-24 DIAGNOSIS — D472 Monoclonal gammopathy: Secondary | ICD-10-CM | POA: Insufficient documentation

## 2023-11-24 NOTE — Progress Notes (Signed)
 [facility] OFFICE PROGRESS NOTE  Patient Care Team: Jerl Mina, [name] Earna Coder, MD as Consulting Physician (Oncology) Carmina Miller, MD as Consulting Physician (Radiation Oncology)   SUMMARY OF ONCOLOGIC HISTORY: Breast cancer    Oncology History Overview Note  # JAN 2023- # RIGHT breast [facility] with lobular feathureds ER-POSITIVE/PR-NEG; her 2-2+IHC; FISH-PENIDNG; G-1. JAN 2023- MRI-MRI shows 1.8 cm right upper upper quadrant mass [s/p recent biopsy; Dr.Byrnett].DIAGNOSIS:  A. BREAST, RIGHT UPPER OUTER QUADRANT; EXCISION:  - INVASIVE LOBULAR CARCINOMA.  - CLIP AND BIOPSY SITE IDENTIFIED.  - SEE CANCER SUMMARY.   Comment:  Multiple additional deeper recut levels were examined, due to disruption  of tissue in the initial slides demonstrating tumor. Invasive carcinoma  measures up to 9 mm in greatest linear extent, although there is dense  fibrosis surrounding the invasive carcinoma, which may account for the  discrepancy between the microscopic measurement, and the radiographic  and gross measurements.   B. LYMPH NODES, RIGHT SENTINEL #1, #2, #3, AND #4; EXCISION:  - FOUR LYMPH NODES, NEGATIVE FOR MALIGNANCY (0/4).   Comment:  Immunohistochemical studies directed against cytokeratin AE1/AE3 are  performed on all 4 tissue blocks, and are negative for metastatic  carcinoma   C. BREAST, RIGHT, ADDITIONAL ANTERIOR/INFERIOR MARGIN; EXCISION:  - BENIGN MAMMARY PARENCHYMA WITH FIBROCYSTIC AND APOCRINE CHANGES,  COLUMNAR CELL CHANGE WITHOUT ATYPIA, USUAL DUCTAL HYPERPLASIA, AND FOCAL  SCLEROSING ADENOSIS.  - FOCAL CHANGES SUGGESTIVE OF BENIGN INTRADUCTAL PAPILLOMA.  - NEGATIVE FOR RESIDUAL INVASIVE LOBULAR CARCINOMA AND OTHER ATYPICAL  PROLIFERATIVE BREAST DISEASE.   CANCER CASE SUMMARY: INVASIVE CARCINOMA OF THE BREAST  Standard(s): AJCC-UICC 8   SPECIMEN  Procedure: Excision  Specimen Laterality: Right   TUMOR   Histologic Type: Invasive lobular carcinoma  Histologic Grade (Nottingham Histologic Score)                       Glandular (Acinar)/Tubular Differentiation: 3                       Nuclear Pleomorphism: 1                       Mitotic Rate: 1                       Overall Grade: Grade 1  Tumor Size: 9 mm  Tumor Focality: Single focus of invasive carcinoma  Tumor Extent: Not applicable  Ductal Carcinoma In Situ (DCIS): Not identified  Lymphatic and/or Vascular Invasion: Not identified  Treatment Effect in the Breast: No known presurgical therapy   MARGINS  Margin Status for Invasive Carcinoma: All margins negative for invasive  carcinoma                       Distance from closest margin: 3 mm                       Specify closest margin: Posterior/deep   Margin Status for DCIS: Not applicable (no DCIS in specimen)   #Stage I ER/PR positive HER2 negative breast cancer pT1b grade 1; no Oncotype done.  S/p lumpectomy  #Dec 28, 2021-s/p radiation; May  17th, 2023-declined AI    July 2016-M-protein-IgA Lamda- MGUS: NOV 2016- M spike- 1gm/dl; IgA- 1898 [total]; K/L=N; AUG 2016- Skeletal survey-Normal;  # Stroke- ? [Dr.Shah; 2018]   Carcinoma of  upper-outer quadrant of right breast in female, estrogen receptor positive (HCC)  10/01/2021 Initial Diagnosis   Carcinoma of upper-outer quadrant of right breast in female, estrogen receptor positive (HCC)   11/10/2021 Cancer Staging   Staging form: Breast, AJCC 8th Edition - Clinical: Stage IA (cT1b, cN0, cM0, G1, ER+, PR-, HER2-) - Signed by Earna Coder, MD on 11/10/2021 Histologic grading system: 3 grade system    INTERVAL HISTORY: Alone.  Ambulating independently.  83 year old female patient with diagnosis of stage I breast cancer lobular ER positive PR negative HER2 negative s/p lumpectomy s/p RT; declined adjuvant AI is   here for follow-up; is here to review the results of MGUS work up.   Denies any new shortness of breath  or cough.  Denies any new symptoms.   Review of Systems  Constitutional:  Positive for malaise/fatigue. Negative for chills, diaphoresis, fever and weight loss.  HENT:  Negative for nosebleeds and sore throat.   Eyes:  Negative for double vision.  Respiratory:  Negative for cough, hemoptysis, sputum production, shortness of breath and wheezing.   Cardiovascular:  Negative for chest pain, palpitations, orthopnea and leg swelling.  Gastrointestinal:  Negative for abdominal pain, blood in stool, constipation, diarrhea, heartburn, melena, nausea and vomiting.  Genitourinary:  Negative for dysuria, frequency and urgency.  Musculoskeletal:  Positive for back pain and joint pain.  Skin: Negative.  Negative for itching and rash.  Neurological:  Negative for dizziness, tingling, focal weakness, weakness and headaches.  Endo/Heme/Allergies:  Does not bruise/bleed easily.  Psychiatric/Behavioral:  Negative for depression. The patient is not nervous/anxious and does not have insomnia.      PAST MEDICAL HISTORY :  Past Medical History:  Diagnosis Date   Arthritis    Breast cancer (HCC)    Bronchitis    Cancer (HCC)    basal cell   Change in bowel habits    Colon polyps    Femur fracture, left (HCC)    Hepatitis    as a child   Hyperlipidemia    Hypertension    controlled with medication;    Hypothyroidism    h/o   MGUS (monoclonal gammopathy of unknown significance)    Osteoporosis    hasn't been checked in a while; unsure how severe;    Personal history of radiation therapy    Postmenopausal    Rectal bleeding    Seasonal allergies    Sleep apnea    does not use cpap   Stroke [facility]) ??   pt unaware but was told she had stroke possibly after fall in 2016-has occ balance issues   Vitamin D deficiency     PAST SURGICAL HISTORY :   Past Surgical History:  Procedure Laterality Date   AXILLARY LYMPH NODE BIOPSY Right 11/01/2021   Procedure: AXILLARY LYMPH NODE BIOPSY;  Surgeon:  Earline Mayotte, MD;  Location: [facility] ORS;  Service: General;  Laterality: Right;   BREAST BIOPSY Left 09/30/2008   Patient presented with acute swelling in the breast, aspiration culture negative.  Subsequent core biopsy showing fragments of cyst wall with granulation tissue, fibrosis and focal residual epithelial lining.  No malignancy.   BREAST BIOPSY Right 09/22/2021   ULTRASOUND-GUIDED BIOPSY: - INVASIVE MAMMARY CARCINOMA WITH LOBULAR FEATURES   BREAST CYST ASPIRATION     BREAST LUMPECTOMY Right 11/01/2021   BREAST LUMPECTOMY WITH NEEDLE LOCALIZATION Right 11/01/2021   Procedure: BREAST LUMPECTOMY WITH NEEDLE LOCALIZATION;  Surgeon: Earline Mayotte, MD;  Location: [facility] ORS;  Service:  General;  Laterality: Right;   BREAST SURGERY Right 11/01/2021   wire loc w/ sn for lumpectomy   CATARACT EXTRACTION W/PHACO Left 03/02/2021   Procedure: CATARACT EXTRACTION PHACO AND INTRAOCULAR LENS PLACEMENT (IOC) LEFT 6.40 00:35.2;  Surgeon: Galen Manila, MD;  Location: [facility];  Service: Ophthalmology;  Laterality: Left;   CATARACT EXTRACTION W/PHACO Right 03/16/2021   Procedure: CATARACT EXTRACTION PHACO AND INTRAOCULAR LENS PLACEMENT (IOC) RIGHT;  Surgeon: Galen Manila, MD;  Location: [facility];  Service: Ophthalmology;  Laterality: Right;  7.42 0:51.0   COLONOSCOPY WITH PROPOFOL N/A 02/05/2015   Procedure: COLONOSCOPY WITH PROPOFOL;  Surgeon: Scot Jun, MD;  Location: [facility] END[facility]COPY;  Service: Endoscopy;  Laterality: N/A;   COLONOSCOPY WITH PROPOFOL N/A 04/08/2020   Procedure: COLONOSCOPY WITH PROPOFOL;  Surgeon: Toledo, Boykin Nearing, MD;  Location: [facility] ENDOSCOPY;  Service: Gastroenterology;  Laterality: N/A;   COLONOSCOPY WITH PROPOFOL N/A 12/20/2022   Procedure: COLONOSCOPY WITH PROPOFOL;  Surgeon: [name], MD;  Location: [facility] ENDOSCOPY;  Service: Endoscopy;  Laterality: N/A;   EYE SURGERY     FEMUR FRACTURE SURGERY Left 04/18/2015   FRACTURE  SURGERY     TONSILLECTOMY      FAMILY HISTORY :   Family History  Problem Relation Age of Onset   Breast cancer Cousin        paternal   Kidney cancer Cousin        maternal   Leukemia Cousin        maternal   Stomach cancer Other        uncle   Prostate cancer Other        grandfather   Skin cancer Other        maternal grandparents    SOCIAL HISTORY:   Social History   Tobacco Use   Smoking status: Never   Smokeless tobacco: Never  Vaping Use   Vaping status: Never Used  Substance Use Topics   Alcohol use: No   Drug use: No    ALLERGIES:  is allergic to mold extract [trichophyton], codeine, indomethacin, other, sulfa antibiotics, cashew nut (anacardium occidentale) skin test, nickel, and sulfamethoxazole-trimethoprim.  MEDICATIONS:  Current Outpatient Medications  Medication Sig Dispense Refill   Alpha-Lipoic Acid 600 MG TABS Take 600 mg by mouth daily.     aspirin 81 MG EC tablet Take 81 mg by mouth daily.     Biotin 5 MG TBDP Take 5 mg by mouth daily.     bisoprolol-hydrochlorothiazide (ZIAC) 2.5-6.25 MG tablet Take 1 tablet by mouth daily.     CALCIUM-VITAMIN D PO Take 1 tablet by mouth daily.     L-Lysine 500 MG TABS Take 600 mg by mouth daily.     Lutein 10 MG TABS Take 10 mg by mouth daily.     Red Yeast Rice Extract (RED YEAST RICE PO) Take 2 Capfuls by mouth daily.     Vitamin D, Ergocalciferol, (DRISDOL) 1.25 MG (50000 UNIT) CAPS capsule Take 50,000 Units by mouth every Sunday.     No current facility-administered medications for this visit.    PHYSICAL EXAMINATION:   BP (!) 153/51 (BP Location: Left Arm, Patient Position: Sitting, Cuff Size: Normal)   Pulse (!) 57   Temp 98.9 F (37.2 C) (Tympanic)   Resp 20   Ht 5\' 2"  (1.575 m)   Wt 146 lb 6.4 oz (66.4 kg)   SpO2 100%   BMI 26.78 kg/m   Filed Weights   11/24/23 1449  Weight: 146 lb 6.4 oz (66.4 kg)     Physical Exam HENT:     Head: Normocephalic and atraumatic.     Mouth/Throat:      Pharynx: No oropharyngeal exudate.  Eyes:     Pupils: Pupils are equal, round, and reactive to light.  Cardiovascular:     Rate and Rhythm: Normal rate and regular rhythm.  Pulmonary:     Effort: No respiratory distress.     Breath sounds: No wheezing.  Abdominal:     General: Bowel sounds are normal. There is no distension.     Palpations: Abdomen is soft. There is no mass.     Tenderness: There is no abdominal tenderness. There is no guarding or rebound.  Musculoskeletal:        General: No tenderness. Normal range of motion.     Cervical back: Normal range of motion and neck supple.  Skin:    General: Skin is warm.  Neurological:     Mental Status: She is alert and oriented to person, place, and time.  Psychiatric:        Mood and Affect: Affect normal.      LABORATORY DATA:  I have reviewed the data as listed    Component Value Date/Time   NA 140 09/05/2018 0730   NA 141 09/29/2011 1424   K 4.1 10/26/2021 1355   K 3.9 09/29/2011 1424   CL 107 09/05/2018 0730   CL 100 09/29/2011 1424   CO2 25 09/05/2018 0730   CO2 25 09/29/2011 1424   GLUCOSE 125 (H) 09/05/2018 0730   GLUCOSE 172 (H) 09/29/2011 1424   BUN 23 09/05/2018 0730   BUN 19 (H) 09/29/2011 1424   CREATININE 0.80 03/30/2022 1413   CREATININE 0.79 09/29/2011 1424   CALCIUM 9.0 09/05/2018 0730   CALCIUM 10.1 09/29/2011 1424   PROT 7.3 09/05/2018 0730   PROT 8.5 (H) 09/29/2011 1424   ALBUMIN 3.5 09/05/2018 0730   ALBUMIN 4.1 09/29/2011 1424   AST 30 09/05/2018 0730   AST 24 09/29/2011 1424   ALT 22 09/05/2018 0730   ALT 12 09/29/2011 1424   ALKPHOS 83 09/05/2018 0730   ALKPHOS 76 09/29/2011 1424   BILITOT 0.8 09/05/2018 0730   BILITOT 0.8 09/29/2011 1424   GFRNONAA >60 09/05/2018 0730   GFRNONAA >60 09/29/2011 1424   GFRAA >60 09/05/2018 0730   GFRAA >60 09/29/2011 1424    No results found for: "SPEP", "UPEP"  Lab Results  Component Value Date   WBC 6.5 09/05/2018   NEUTROABS 4.2  09/05/2018   HGB 10.9 (L) 09/05/2018   HCT 34.1 (L) 09/05/2018   MCV 89.3 09/05/2018   PLT 242 09/05/2018      Chemistry      Component Value Date/Time   NA 140 09/05/2018 0730   NA 141 09/29/2011 1424   K 4.1 10/26/2021 1355   K 3.9 09/29/2011 1424   CL 107 09/05/2018 0730   CL 100 09/29/2011 1424   CO2 25 09/05/2018 0730   CO2 25 09/29/2011 1424   BUN 23 09/05/2018 0730   BUN 19 (H) 09/29/2011 1424   CREATININE 0.80 03/30/2022 1413   CREATININE 0.79 09/29/2011 1424      Component Value Date/Time   CALCIUM 9.0 09/05/2018 0730   CALCIUM 10.1 09/29/2011 1424   ALKPHOS 83 09/05/2018 0730   ALKPHOS 76 09/29/2011 1424   AST 30 09/05/2018 0730   AST 24 09/29/2011 1424   ALT 22 09/05/2018 0730  ALT 12 09/29/2011 1424   BILITOT 0.8 09/05/2018 0730   BILITOT 0.8 09/29/2011 1424        ASSESSMENT & PLAN:   Carcinoma of upper-outer quadrant of right breast in female, estrogen receptor positive (HCC) # RIGHT INVASIVE LOBULAR CANCER- ER-POSITIVE/PR-NEG; her 2-NEGATIVE;  [s/p lumpectomy; margins 3 mm].  T1b; grade 1.  S/p WBRT [s/p RT may 2nd 2023]. DECLINED any endocrine adjuvant therapy.  Clinically stable.  March 2025-Dr.Cintron- Bil Mammo- WNL-   # IgA lambda MGUS [since 2016]- AUG  2023- IgA 1.1- kappa/lamda-N [[facility]]; SEP 2024-CBC/CMP-N.  K/L= WNL; M protein1.3 ; MARCH 2025- M 1.3; K/L=WNL.  Monitor for now.  Again reviewed the low/risk of transformation into multiple myeloma at this time.  Continue stable at this time every 6 months.  Multiple questions answered- stable.    # Vaccinations: COVID/RSV-flu/defer to Pharmacy; no contraindications from oncology standpoint.  # DISPOSITION:  # follow-up in 6 month-MD;[facility]- - 2 weeks prior- labs- cbc/cmp;iron studies; ferritin- MM panel; K/L light chain ratio-Dr.B  Cc- Dr.Hedrick-       Earna Coder, MD 11/24/2023 3:21 PM

## 2023-11-24 NOTE — Progress Notes (Signed)
 Mammogram 11/08/23.

## 2023-11-24 NOTE — Assessment & Plan Note (Signed)
#   RIGHT INVASIVE LOBULAR CANCER- ER-POSITIVE/PR-NEG; her 2-NEGATIVE;  [s/p lumpectomy; margins 3 mm].  T1b; grade 1.  S/p WBRT [s/p RT may 2nd 2023]. DECLINED any endocrine adjuvant therapy.  Clinically stable.  March 2025-Dr.Cintron- Bil Mammo- WNL-   # IgA lambda MGUS [since 2016]- AUG  2023- IgA 1.1- kappa/lamda-N [[facility]]; SEP 2024-CBC/CMP-N.  K/L= WNL; M protein1.3 ; MARCH 2025- M 1.3; K/L=WNL.  Monitor for now.  Again reviewed the low/risk of transformation into multiple myeloma at this time.  Continue stable at this time every 6 months.  Multiple questions answered- stable.    # Vaccinations: COVID/RSV-flu/defer to Pharmacy; no contraindications from oncology standpoint.  # DISPOSITION:  # follow-up in 6 month-MD;[facility]- - 2 weeks prior- labs- cbc/cmp;iron studies; ferritin- MM panel; K/L light chain ratio-Dr.B  Cc- Dr.Hedrick-

## 2024-03-05 ENCOUNTER — Encounter: Payer: Self-pay | Admitting: Radiation Oncology

## 2024-03-05 ENCOUNTER — Ambulatory Visit
Admission: RE | Admit: 2024-03-05 | Discharge: 2024-03-05 | Disposition: A | Discharge status: Home / Self Care | Payer: Medicare HMO | Source: Ambulatory Visit | Attending: Radiation Oncology | Admitting: Radiation Oncology

## 2024-03-05 VITALS — Temp 97.3°F | Resp 15 | Ht 62.0 in | Wt 149.6 lb

## 2024-03-05 DIAGNOSIS — Z170 Estrogen receptor positive status [ER+]: Secondary | ICD-10-CM

## 2024-03-05 DIAGNOSIS — Z853 Personal history of malignant neoplasm of breast: Secondary | ICD-10-CM | POA: Insufficient documentation

## 2024-03-05 DIAGNOSIS — Z923 Personal history of irradiation: Secondary | ICD-10-CM | POA: Diagnosis not present

## 2024-03-05 NOTE — Progress Notes (Signed)
 Radiation Oncology Follow up Note  Name: Kristin Wise   Date:   03/05/2024 MRN:  [id number] DOB: 1940/12/20    This 83 y.o. female presents to the clinic today for 2-year follow-up status post whole breast radiation to her right breast for stage Ia ER positive invasive lobular carcinoma.  REFERRING PROVIDER: Valora Agent, MD  HPI: Patient is an 83 year old female now out over 2 years having completed whole breast radiation to her right breast for stage Ia ER positive invasive lobular carcinoma.  She still complains of breast tenderness..  She had mammograms back in March which I have reviewed were BI-RADS 2 benign.  She has declined endocrine therapy.  COMPLICATIONS OF TREATMENT: none  FOLLOW UP COMPLIANCE: keeps appointments   PHYSICAL EXAM:  Temp (!) 97.3 F (36.3 C)   Resp 15   Ht 5' 2 (1.575 m)   Wt 149 lb 9.6 oz (67.9 kg)   BMI 27.36 kg/m  Lungs are clear to A&P cardiac examination essentially unremarkable with regular rate and rhythm. No dominant mass or nodularity is noted in either breast in 2 positions examined. Incision is well-healed. No axillary or supraclavicular adenopathy is appreciated. Cosmetic result is excellent.  Well-developed well-nourished patient in NAD. HEENT reveals PERLA, EOMI, discs not visualized.  Oral cavity is clear. No oral mucosal lesions are identified. Neck is clear without evidence of cervical or supraclavicular adenopathy. Lungs are clear to A&P. Cardiac examination is essentially unremarkable with regular rate and rhythm without murmur rub or thrill. Abdomen is benign with no organomegaly or masses noted. Motor sensory and DTR levels are equal and symmetric in the upper and lower extremities. Cranial nerves II through XII are grossly intact. Proprioception is intact. No peripheral adenopathy or edema is identified. No motor or sensory levels are noted. Crude visual fields are within normal range.  RADIOLOGY RESULTS: Mammograms reviewed  compatible with above-stated findings  PLAN: The present time patient is doing well out over 2 years with no evidence of disease.  She continues follow-up care with surgery medical oncology.  Awaiting to turn follow-up care over to them I will be happy to reevaluate the patient anytime in the future should that be indicated.  I would like to take this opportunity to thank you for allowing me to participate in the care of your patient.SABRA Marcey Penton, MD

## 2024-04-23 ENCOUNTER — Other Ambulatory Visit: Payer: Self-pay | Admitting: Otolaryngology

## 2024-04-23 ENCOUNTER — Encounter: Payer: Self-pay | Admitting: Otolaryngology

## 2024-04-23 DIAGNOSIS — R42 Dizziness and giddiness: Secondary | ICD-10-CM

## 2024-04-26 ENCOUNTER — Ambulatory Visit
Admission: RE | Admit: 2024-04-26 | Discharge: 2024-04-26 | Disposition: A | Discharge status: Home / Self Care | Source: Ambulatory Visit | Attending: Otolaryngology | Admitting: Otolaryngology

## 2024-04-26 DIAGNOSIS — R42 Dizziness and giddiness: Secondary | ICD-10-CM

## 2024-04-26 MED ORDER — GADOPICLENOL 0.5 MMOL/ML IV SOLN
7.5000 mL | Freq: Once | INTRAVENOUS | Status: AC | PRN
  Administered 2024-04-26: 7 mL via INTRAVENOUS

## 2024-05-01 ENCOUNTER — Telehealth: Payer: Self-pay | Admitting: *Deleted

## 2024-05-01 NOTE — Telephone Encounter (Signed)
 Spoke to pt re: results of MRI- awaiting evaluation with neurosurgery-  Keep appts as planned- GB

## 2024-05-01 NOTE — Telephone Encounter (Signed)
 Patient called today and said that she has seen Dr. Milissa since that they found a brain tumor and they have an appointment to go to see a nerve surgeon.  The patient is wondering if you need to be involved or she does not really know which way to go. There is a scan about 8/29/ .  I think she is trying to say is you think this is a good thing to do.

## 2024-05-03 ENCOUNTER — Ambulatory Visit (INDEPENDENT_AMBULATORY_CARE_PROVIDER_SITE_OTHER): Admitting: Neurosurgery

## 2024-05-03 ENCOUNTER — Encounter: Payer: Self-pay | Admitting: Neurosurgery

## 2024-05-03 VITALS — BP 167/86 | HR 54 | Ht 62.0 in | Wt 152.0 lb

## 2024-05-03 DIAGNOSIS — D496 Neoplasm of unspecified behavior of brain: Secondary | ICD-10-CM

## 2024-05-03 DIAGNOSIS — D330 Benign neoplasm of brain, supratentorial: Secondary | ICD-10-CM

## 2024-05-03 DIAGNOSIS — I10 Essential (primary) hypertension: Secondary | ICD-10-CM | POA: Insufficient documentation

## 2024-05-03 NOTE — Progress Notes (Signed)
 Assessment : 83 year old lady with a past medical history significant for breast cancer 2 years ago for which she had a partial mastectomy followed by chemo and radiation.  In addition to that, she has MGUS.  Recently she had some dizziness and went to her primary care doctor who referred her to ENT.  Here, she had an evaluation and an MRI of the head was recommended.  This demonstrated a very large multilobulated mass in the middle cranial fossa extending into the posterior fossa.  Interestingly, she had an MRI of the brain in 2018 which she does not remember the indication for and at that time, this was not there.  She says that her balance is off and her vision has deteriorated in the past 6 months.  She has an appointment coming up with her eye doctor on the 17th.  She was accompanied by her husband and her son.  Plan : I reviewed the images and there is indeed a very large mass that extends from the posterior clinoid, most likely into the middle cranial fossa anteriorly but also into the prepontine space.  It is likely a relatively/moderate fast-growing tumor as it was not there in 2018.  I went over the differential diagnosis with her.  It is very well possible that this tumor started growing in 2019 which then means that over the past 6 years it has grown to the size and is still a benign meningioma.  Alternatively, it could be a more atypical meningioma but given her past medical history we also have to consider the possibility of this being a metastasis.  It extends forward into the sella and into the pituitary gland as well.  I shared with her that at this moment we need to get more information.  I need to have an eye exam to see how much optic nerve compression there is.  I will try to get this all done so we have a visual field examination to be able to assess her vision.  Secondly, she needs to get her hormonal status checked.  Had an extensive conversation with them and  explained to her that the quality of life is extremely important because any intervention that we do, can deteriorate that and at the age of 38 weak her ability to bounce back is going to be very limited.  I will see her back in a few weeks after all the studies have been done.   Social History   Socioeconomic History   Marital status: Married    Spouse name: Not on file   Number of children: Not on file   Years of education: Not on file   Highest education level: Not on file  Occupational History   Not on file  Tobacco Use   Smoking status: Never   Smokeless tobacco: Never  Vaping Use   Vaping status: Never Used  Substance and Sexual Activity   Alcohol use: No   Drug use: No   Sexual activity: Not on file  Other Topics Concern   Not on file  Social History Narrative   Not on file   Social Drivers of Health   Financial Resource Strain: Patient Declined (03/22/2024)   Received from [facility]   Overall Financial Resource Strain (CARDIA)    Difficulty of Paying Living Expenses: Patient declined  Food Insecurity: Patient Declined (03/22/2024)   Received from [facility]   Hunger Vital Sign    Within the past 12 months, you  worried that your food would run out before you got the money to buy more.: Patient declined    Within the past 12 months, the food you bought just didn't last and you didn't have money to get more.: Patient declined  Transportation Needs: No Transportation Needs (03/22/2024)   Received from [facility] - Transportation    In the past 12 months, has lack of transportation kept you from medical appointments or from getting medications?: No    Lack of Transportation (Non-Medical): No  Physical Activity: Not on file  Stress: Not on file  Social Connections: Not on file  Intimate Partner Violence: Not on file    Family History  Problem Relation Age of Onset   Breast cancer Cousin         paternal   Kidney cancer Cousin        maternal   Leukemia Cousin        maternal   Stomach cancer Other        uncle   Prostate cancer Other        grandfather   Skin cancer Other        maternal grandparents    Allergies  Allergen Reactions   Mold Extract [Trichophyton] Other (See Comments)    Allergy to it.    Codeine     Feels bad   Indomethacin     Bad dreams   Other     Sodium Laurel Sulfate- caused gums to peel, corners of mouth became sore   Sulfa Antibiotics Hives    Feels terrible    Cashew Nut (Anacardium Occidentale) Skin Test Rash   Nickel Rash   Sulfamethoxazole-Trimethoprim Rash    Past Medical History:  Diagnosis Date   Arthritis    Breast cancer (HCC)    Bronchitis    Cancer (HCC)    basal cell   Change in bowel habits    Colon polyps    Femur fracture, left (HCC)    Hepatitis    as a child   Hyperlipidemia    Hypertension    controlled with medication;    Hypothyroidism    h/o   MGUS (monoclonal gammopathy of unknown significance)    Osteoporosis    hasn't been checked in a while; unsure how severe;    Personal history of radiation therapy    Postmenopausal    Rectal bleeding    Seasonal allergies    Sleep apnea    does not use cpap   Stroke [facility]) ??   pt unaware but was told she had stroke possibly after fall in 2016-has occ balance issues   Vitamin D deficiency     Past Surgical History:  Procedure Laterality Date   AXILLARY LYMPH NODE BIOPSY Right 11/01/2021   Procedure: AXILLARY LYMPH NODE BIOPSY;  Surgeon: Dessa Reyes ORN, MD;  Location: [facility] ORS;  Service: General;  Laterality: Right;   BREAST BIOPSY Left 09/30/2008   Patient presented with acute swelling in the breast, aspiration culture negative.  Subsequent core biopsy showing fragments of cyst wall with granulation tissue, fibrosis and focal residual epithelial lining.  No malignancy.   BREAST BIOPSY Right 09/22/2021   ULTRASOUND-GUIDED BIOPSY: - INVASIVE MAMMARY  CARCINOMA WITH LOBULAR FEATURES   BREAST CYST ASPIRATION     BREAST LUMPECTOMY Right 11/01/2021   BREAST LUMPECTOMY WITH NEEDLE LOCALIZATION Right 11/01/2021   Procedure: BREAST LUMPECTOMY WITH NEEDLE LOCALIZATION;  Surgeon: Dessa Reyes ORN, MD;  Location: [facility] ORS;  Service: General;  Laterality: Right;   BREAST SURGERY Right 11/01/2021   wire loc w/ sn for lumpectomy   CATARACT EXTRACTION W/PHACO Left 03/02/2021   Procedure: CATARACT EXTRACTION PHACO AND INTRAOCULAR LENS PLACEMENT (IOC) LEFT 6.40 00:35.2;  Surgeon: Jaye Fallow, MD;  Location: [facility];  Service: Ophthalmology;  Laterality: Left;   CATARACT EXTRACTION W/PHACO Right 03/16/2021   Procedure: CATARACT EXTRACTION PHACO AND INTRAOCULAR LENS PLACEMENT (IOC) RIGHT;  Surgeon: Jaye Fallow, MD;  Location: [facility];  Service: Ophthalmology;  Laterality: Right;  7.42 0:51.0   COLONOSCOPY WITH PROPOFOL  N/A 02/05/2015   Procedure: COLONOSCOPY WITH PROPOFOL ;  Surgeon: Lamar ONEIDA Holmes, MD;  Location: [facility]COPY;  Service: Endoscopy;  Laterality: N/A;   COLONOSCOPY WITH PROPOFOL  N/A 04/08/2020   Procedure: COLONOSCOPY WITH PROPOFOL ;  Surgeon: Toledo, Ladell POUR, MD;  Location: [facility] ENDOSCOPY;  Service: Gastroenterology;  Laterality: N/A;   COLONOSCOPY WITH PROPOFOL  N/A 12/20/2022   Procedure: COLONOSCOPY WITH PROPOFOL ;  Surgeon: Maryruth Ole ONEIDA, MD;  Location: [facility] ENDOSCOPY;  Service: Endoscopy;  Laterality: N/A;   EYE SURGERY     FEMUR FRACTURE SURGERY Left 04/18/2015   FRACTURE SURGERY     TONSILLECTOMY       Physical Exam HENT:     Head: Normocephalic.     Nose: Nose normal.  Eyes:     Pupils: Pupils are equal, round, and reactive to light.  Cardiovascular:     Rate and Rhythm: Normal rate.  Pulmonary:     Effort: Pulmonary effort is normal.  Abdominal:     General: Abdomen is flat.  Musculoskeletal:     Cervical back: Normal range of motion.  Neurological:     Mental Status: She  is alert.     Cranial Nerves: Cranial nerves 2-12 are intact.     Sensory: Sensation is intact.     Motor: Motor function is intact.     Coordination: Coordination is intact.        Results for orders placed or performed during the hospital encounter of 09/06/16  MR BRAIN W WO CONTRAST   Narrative   CLINICAL DATA:  Lower tooth abscess for 6 months, change in taste and smell. Evaluate anosmia.  EXAM: MRI HEAD WITHOUT AND WITH CONTRAST  TECHNIQUE: Multiplanar, multiecho pulse sequences of the brain and surrounding structures were obtained without and with intravenous contrast. Olfactory protocol  CONTRAST:  15mL MULTIHANCE  GADOBENATE DIMEGLUMINE  529 MG/ML IV SOLN  COMPARISON:  None.  FINDINGS: INTRACRANIAL CONTENTS: No reduced diffusion to suggest acute ischemia. No susceptibility artifact to suggest hemorrhage. The ventricles and sulci are normal for patient's age. No suspicious parenchymal signal, masses, mass effect. Old small LEFT cerebellar infarct. No abnormal intraparenchymal or extra-axial enhancement. Normal appearance of the olfactory grooves, no abnormal signal or enhancement along the course of the first cranial nerves. No abnormal extra-axial fluid collections. No extra-axial masses. 3 mm pineal cyst.  VASCULAR: Normal major intracranial vascular flow voids present at skull base.  SKULL AND UPPER CERVICAL SPINE: No abnormal sellar expansion. No suspicious calvarial bone marrow signal. Craniocervical junction maintained.  SINUSES/ORBITS: Trace ethmoid mucosal thickening. Mastoid air cells are well aerated. The included ocular globes and orbital contents are non-suspicious.  OTHER: None.  IMPRESSION: Old small LEFT cerebellar infarct. Otherwise negative MRI of the head with and without contrast.   Electronically Signed   By: Minerva Salle M.D.   On: 09/06/2016 15:33

## 2024-05-06 ENCOUNTER — Telehealth: Payer: Self-pay | Admitting: *Deleted

## 2024-05-06 NOTE — Telephone Encounter (Signed)
 Called and said that she has a checkup on October 1 for her brain tumor and the next 1 is going to be Dr. Damaris which is October 1.  She says that she only wants to get the labs from both of them if they need on both one of them can do it but she only wants to get stuck wants.  I know Dr. Rennie is wanting labs and she goes and gets them at [facility].  I do not see that it needs labs for Dr.Janjua, Dino HERO, MD .  I did call and leave a message to see if they would talk to me if she is going to need labs that day or not.  Talk to Rosaline she is fine for getting the labs at [facility] for the ones for Dr. Rennie as well as the 1 for Dr. Dino.  Told the the patient that that is fine to get all the labs at the same time.

## 2024-05-07 ENCOUNTER — Telehealth: Payer: Self-pay | Admitting: *Deleted

## 2024-05-07 NOTE — Telephone Encounter (Signed)
 Patient called and said that they did not get the fax number and the code for the myeloma panel which is [id number].  I faxed it to the fax number this morning as well as when I got a call from her in the afternoon I did send it again I told her that I will check on it again tomorrow.  He did get the labs done today and she was told that they have enough serum to do the myeloma panel they said

## 2024-05-08 ENCOUNTER — Telehealth: Payer: Self-pay | Admitting: *Deleted

## 2024-05-08 ENCOUNTER — Encounter: Payer: Self-pay | Admitting: Internal Medicine

## 2024-05-08 LAB — LAB REPORT - SCANNED
Calcium: 9.3
EGFR: 84
Free T4: 1.04 ng/dL
TSH: 1.8 (ref 0.41–5.90)

## 2024-05-08 NOTE — Telephone Encounter (Signed)
 I spoke to staff with [facility] because I faxed the add on lab yesterday and it was sent to [phone].I could not get any one to talk to and then I left message and later in the day 4:30 someone from [facility] and she told me that I would call in am to [facility] and tell them that they need to add on lab and it is myeloma- test [id number]. The patient spoke to staff at the lab drawand they said it should have enough blood per patient and the person at [facility] [address] she said the same . The lady on then phone and said she will add it on and if the they have enough tubes.

## 2024-05-15 ENCOUNTER — Encounter: Payer: Self-pay | Admitting: Internal Medicine

## 2024-05-17 ENCOUNTER — Encounter: Payer: Self-pay | Admitting: Neurosurgery

## 2024-05-17 ENCOUNTER — Ambulatory Visit: Admitting: Neurosurgery

## 2024-05-17 VITALS — BP 161/81 | HR 52 | Ht 62.0 in | Wt 152.0 lb

## 2024-05-17 DIAGNOSIS — D432 Neoplasm of uncertain behavior of brain, unspecified: Secondary | ICD-10-CM

## 2024-05-17 DIAGNOSIS — D330 Benign neoplasm of brain, supratentorial: Secondary | ICD-10-CM

## 2024-05-17 NOTE — Progress Notes (Signed)
 83 year old lady with finding of a very large midline mass suspected to be a meningioma.  I saw her a few weeks ago when we ordered lab work and eye exam.  She returns with her husband and son and says that the eye exam did not show any visual field defects but unfortunately she did not bring the eye exam with her.  Her lab work for her pituitary hormones does not show any abnormalities, in particular does not show any elevated prolactin that would make me suspect that she has a prolactinoma.  I extensively went over the options with them: First option would be is to do nothing.  The second option would be is to pursue radiation therapy and Last option would be is to do a surgical resection.  With her age of 21 I definitely do not want to do any surgery because any surgery would entail deterioration of her neurological condition and I do not believe that this would be in her best interest.  Therefore I talked to them about the option of doing nothing versus radiation.  I told her that if we do nothing, then she has to be aware that she is going to deteriorate depending on how fast the tumor grows and any kind of surgical resection or intervention once the tumor is much bigger and she has neurological deficit is only going to lead to further deterioration.  I think radiation option is a very good option and I recommended that she talks to them and after discussing it with her son, she decided to review her options with them as well.  From my standpoint she is going to come back and see me in 3 months with a repeat MRI and if she decides to have radiation prior to that, she will pursue the regimen as per their recommendation.

## 2024-05-23 NOTE — Progress Notes (Incomplete)
 Location/Histology of Brain Tumor: Benign neoplasm of supratentorial region of brain   Patient presented with symptoms of:  Dizziness, balance and vision changes  Past or anticipated interventions, if any, per neurosurgery:  I think radiation option is a very good option and I recommended that she talks to them and after discussing it with her son, she decided to review her options with them as well.   From my standpoint she is going to come back and see me in 3 months with a repeat MRI and if she decides to have radiation prior to that, she will pursue the regimen as per their recommendation.        Electronically signed by Rosslyn Dino HERO, MD at 05/17/2024  2:07 PM Past or anticipated interventions, if any, per medical oncology: {:18581}  Dose of Decadron , if applicable: {:18581}  Recent neurologic symptoms, if any:  Seizures: {:18581} Headaches: {:18581} Nausea: {:18581} Dizziness/ataxia: {:18581} Difficulty with hand coordination: {:18581} Focal numbness/weakness: {:18581} Visual deficits/changes: {:18581} Confusion/Memory deficits: {:18581}  Painful bone metastases at present, if any: {:18581}  SAFETY ISSUES: Prior radiation? {:18581} Pacemaker/ICD? {:18581} Possible current pregnancy? {:18581} Is the patient on methotrexate? {:18581}  Additional Complaints / other details: ***

## 2024-05-23 NOTE — Progress Notes (Signed)
 Radiation Oncology         [phone] ________________________________  Name: Kristin Wise        MRN: [id number]  Date of Service: 05/24/2024 DOB: 1940/11/21  RR:Yzimprx, Lynwood, MD  Rosslyn Dino HERO, MD     REFERRING PHYSICIAN: Rosslyn Dino HERO, MD   DIAGNOSIS: Benign neoplasm of cerebral meninges, D32.0   HISTORY OF PRESENT ILLNESS: Kristin Wise is a 83 y.o. female seen in consultation for radiation therapy.  Kristin Wise is an 83 yo F w/ a hx of invasive Stage I ER+ lobular carcinoma of the R breast s/p lumpectomy and adjuvant radiation in 2023 who is presenting for evaluation of what appears to be a R sphenoid wing meningioma. She developed progressive dizziness and vision problems over the last 6 months for which she went to see her PCP. MRI was obtained which revealed a 4.4 x 3.1 x 2.9 cm mass in the L sphenoid wing with involvement of the cavernous sinus including encasement of the R ICA and encroachment on/possible encasement of the optic chiasm, and R optic tract.  Most recent prior MRI was from 2018 and this mass was not there.   She was seen by Dr. Rosslyn in neurosurgery on September 5th. He was somewhat concerned with the speed with which this developed given it was not there on scans in 2018. He also wanted some additional evaluations including an eye exam and assessment of pituitary hormone levels. She completed these tests and there were no abnormalities noted. Specifically, testing did not show any visual field defects and she had no hormonal abnormalities such as elevated prolactin to suggest a prolactinoma.  He reviewed treatment options including doing nothing, doing radiation or doing surgery.  He does not recommend surgery and spoke mostly with her about doing nothing versus doing radiation. The patient is here today to discuss radiation options.   PREVIOUS RADIATION THERAPY: Yes to the R breast in 2023  AUTOIMMUNE DISEASE: {EXAM;  YES/NO:19492::No}  MEDICAL DEVICES: {EXAM; YES/NO:19492::No}  PREGNANCY: No - age   PAST MEDICAL HISTORY:  Past Medical History:  Diagnosis Date  . Arthritis   . Breast cancer (HCC)   . Bronchitis   . Cancer (HCC)    basal cell  . Change in bowel habits   . Colon polyps   . Femur fracture, left (HCC)   . Hepatitis    as a child  . Hyperlipidemia   . Hypertension    controlled with medication;   . Hypothyroidism    h/o  . MGUS (monoclonal gammopathy of unknown significance)   . Osteoporosis    hasn't been checked in a while; unsure how severe;   . Personal history of radiation therapy   . Postmenopausal   . Rectal bleeding   . Seasonal allergies   . Sleep apnea    does not use cpap  . Stroke [facility]) ??   pt unaware but was told she had stroke possibly after fall in 2016-has occ balance issues  . Vitamin D deficiency        PAST SURGICAL HISTORY: Past Surgical History:  Procedure Laterality Date  . AXILLARY LYMPH NODE BIOPSY Right 11/01/2021   Procedure: AXILLARY LYMPH NODE BIOPSY;  Surgeon: Dessa Reyes ORN, MD;  Location: [facility] ORS;  Service: General;  Laterality: Right;  . BREAST BIOPSY Left 09/30/2008   Patient presented with acute swelling in the breast, aspiration culture negative.  Subsequent core biopsy showing fragments of cyst wall with granulation tissue,  fibrosis and focal residual epithelial lining.  No malignancy.  SABRA BREAST BIOPSY Right 09/22/2021   ULTRASOUND-GUIDED BIOPSY: - INVASIVE MAMMARY CARCINOMA WITH LOBULAR FEATURES  . BREAST CYST ASPIRATION    . BREAST LUMPECTOMY Right 11/01/2021  . BREAST LUMPECTOMY WITH NEEDLE LOCALIZATION Right 11/01/2021   Procedure: BREAST LUMPECTOMY WITH NEEDLE LOCALIZATION;  Surgeon: Dessa Reyes ORN, MD;  Location: [facility] ORS;  Service: General;  Laterality: Right;  . BREAST SURGERY Right 11/01/2021   wire loc w/ sn for lumpectomy  . CATARACT EXTRACTION W/PHACO Left 03/02/2021   Procedure: CATARACT EXTRACTION  PHACO AND INTRAOCULAR LENS PLACEMENT (IOC) LEFT 6.40 00:35.2;  Surgeon: Jaye Fallow, MD;  Location: [facility];  Service: Ophthalmology;  Laterality: Left;  . CATARACT EXTRACTION W/PHACO Right 03/16/2021   Procedure: CATARACT EXTRACTION PHACO AND INTRAOCULAR LENS PLACEMENT (IOC) RIGHT;  Surgeon: Jaye Fallow, MD;  Location: [facility];  Service: Ophthalmology;  Laterality: Right;  7.42 0:51.0  . COLONOSCOPY WITH PROPOFOL  N/A 02/05/2015   Procedure: COLONOSCOPY WITH PROPOFOL ;  Surgeon: Lamar ONEIDA Holmes, MD;  Location: [facility] END[facility];  Service: Endoscopy;  Laterality: N/A;  . COLONOSCOPY WITH PROPOFOL  N/A 04/08/2020   Procedure: COLONOSCOPY WITH PROPOFOL ;  Surgeon: Toledo, Ladell POUR, MD;  Location: [facility] ENDOSCOPY;  Service: Gastroenterology;  Laterality: N/A;  . COLONOSCOPY WITH PROPOFOL  N/A 12/20/2022   Procedure: COLONOSCOPY WITH PROPOFOL ;  Surgeon: Maryruth Ole ONEIDA, MD;  Location: [facility] ENDOSCOPY;  Service: Endoscopy;  Laterality: N/A;  . EYE SURGERY    . FEMUR FRACTURE SURGERY Left 04/18/2015  . FRACTURE SURGERY    . TONSILLECTOMY       FAMILY HISTORY:  Family History  Problem Relation Age of Onset  . Breast cancer Cousin        paternal  . Kidney cancer Cousin        maternal  . Leukemia Cousin        maternal  . Stomach cancer Other        uncle  . Prostate cancer Other        grandfather  . Skin cancer Other        maternal grandparents     SOCIAL HISTORY:  reports that she has never smoked. She has never used smokeless tobacco. She reports that she does not drink alcohol and does not use drugs.   ALLERGIES: Mold extract [trichophyton], Codeine, Indomethacin, Other, Sulfa antibiotics, Cashew nut (anacardium occidentale) skin test, Nickel, and Sulfamethoxazole-trimethoprim   MEDICATIONS:  Current Outpatient Medications  Medication Sig Dispense Refill  . Alpha-Lipoic Acid 600 MG TABS Take 600 mg by mouth daily. (Patient not taking: Reported  on 05/17/2024)    . aspirin 81 MG EC tablet Take 81 mg by mouth daily.    . Biotin 5 MG TBDP Take 5 mg by mouth daily. (Patient not taking: Reported on 05/17/2024)    . bisoprolol-hydrochlorothiazide (ZIAC) 2.5-6.25 MG tablet Take 1 tablet by mouth daily.    SABRA CALCIUM-VITAMIN D PO Take 1 tablet by mouth daily. (Patient not taking: Reported on 05/17/2024)    . L-Lysine 500 MG TABS Take 600 mg by mouth daily.    . Lutein 10 MG TABS Take 10 mg by mouth daily.    . Red Yeast Rice Extract (RED YEAST RICE PO) Take 2 Capfuls by mouth daily. (Patient not taking: Reported on 05/17/2024)    . Vitamin D, Ergocalciferol, (DRISDOL) 1.25 MG (50000 UNIT) CAPS capsule Take 50,000 Units by mouth every Sunday. (Patient not taking: Reported on 05/17/2024)  No current facility-administered medications for this visit.     REVIEW OF SYSTEMS: The patient reports *** and review of symptoms otherwise negative.      PHYSICAL EXAM:  Wt Readings from Last 3 Encounters:  05/17/24 152 lb (68.9 kg)  05/03/24 152 lb (68.9 kg)  03/05/24 149 lb 9.6 oz (67.9 kg)   Temp Readings from Last 3 Encounters:  03/05/24 (!) 97.3 F (36.3 C)  11/24/23 98.9 F (37.2 C) (Tympanic)  05/26/23 98.5 F (36.9 C) (Tympanic)   BP Readings from Last 3 Encounters:  05/17/24 (!) 161/81  05/03/24 (!) 167/86  11/24/23 (!) 153/51   Pulse Readings from Last 3 Encounters:  05/17/24 (!) 52  05/03/24 (!) 54  11/24/23 (!) 57    /10   Physical Exam   ECOG = ***   LABORATORY DATA:  Lab Results  Component Value Date   WBC 6.5 09/05/2018   HGB 10.9 (L) 09/05/2018   HCT 34.1 (L) 09/05/2018   MCV 89.3 09/05/2018   PLT 242 09/05/2018   Lab Results  Component Value Date   NA 140 09/05/2018   K 4.1 10/26/2021   CL 107 09/05/2018   CO2 25 09/05/2018   Lab Results  Component Value Date   ALT 22 09/05/2018   AST 30 09/05/2018   ALKPHOS 83 09/05/2018   BILITOT 0.8 09/05/2018      RADIOGRAPHY: MR BRAIN/IAC W WO  CONTRAST Result Date: 04/26/2024 CLINICAL DATA:  Provided history: Dizziness. Additional history provided: Vision problems, weakness, nausea, history of breast cancer. EXAM: MRI HEAD WITHOUT AND WITH CONTRAST TECHNIQUE: Multiplanar, multiecho pulse sequences of the brain and surrounding structures were obtained without and with intravenous contrast. CONTRAST:  Vueway  intravenous contrast. COMPARISON:  7 mL Vueway  intravenous contrast. FINDINGS: Brain: Mild generalized cerebral atrophy. 4.4 x 3.1 x 2.9 cm enhancing mass centered in the right paraclinoid region. Portions of the mass are also present within the suprasellar cistern, cavernous sinus/medial aspect of middle cranial fossa on the right, prepontine cistern and within the sella. The mass encases portions of the cavernous and supraclinoid right ICA, and right middle cerebral artery M1 segment. The mass also likely partially encases the right anterior cerebral artery proximal A1 segment. Additionally, the mass abuts the mid and distal basilar artery and right posterior cerebral artery P1 segment. The tumor encroaches upon (and may encase) the optic chiasm, right pre chiasmatic optic nerve and right optic tract. The pituitary stalk, and portions of the pituitary gland, are not well delineated separate from the tumor. Mild mass effect upon the anteromedial right temporal lobe (without associated parenchymal edema). Partial effacement of the third ventricle anteroinferiorly. Few small foci of T2 FLAIR hyperintense signal abnormality scattered within the cerebral white matter, not unexpected for age. Unchanged small chronic infarct within the left cerebellar hemisphere. There is no acute infarct. No extra-axial fluid collection. No midline shift. Vascular: Maintained flow voids within the proximal large arterial vessels. Relationship of tumor to arterial vessels and the right cavernous sinus, as discussed above. Skull and upper cervical spine: No focal worrisome  marrow lesion. Sinuses/Orbits: No mass or acute finding within the imaged orbits. Prior bilateral ocular lens replacement. No significant paranasal sinus disease. Impression #1 will be called to the ordering clinician or representative by the Radiologist Assistant, and communication documented in the PACS or [employer]. IMPRESSION: 1. 4.4 x 3.1 x 2.9 cm enhancing mass centered in the right paraclinoid region, with involvement of regional structures, as detailed within the body  of the report. Given the eccentric position of this tumor, and given the absence of significant sellar expansion, this is favored to reflect a meningioma. Pituitary macroadenoma is also a consideration. 2. Unchanged small chronic infarct within the left cerebellar hemisphere. 3. Mild generalized cerebral atrophy. Electronically Signed   By: Rockey Childs D.O.   On: 04/26/2024 14:57     PATHOLOGY: no pertinent pathology     IMPRESSION/PLAN:  We discussed the diagnosis and natural history of meningioma as well as treatment options. Surgical resection was considered by neurosurgery but not favored. We therefore recommend hyperfractionated stereotactic radiation . We reviewed the logistics of treatment, including CT simulation with planning MRI, use of a short thermoplastic mask for immobilization, and outpatient delivery of radiation. We discussed potential dose regimens, including single-fraction versus hypo-fractionated treatment with ***plan for hypofractionated treatment given tumor size. We reviewed potential side effects, including short-term fatigue, headache, or nausea, as well a subacute risk of peritumoral edema causing neurologic symptoms, and rare late risk suchs as radiation necrosis. The patient expressed understanding and agreement with the plan and wishes to proceed. We will arrange for simulation and treatment planning, and provided contact information with encouragement to call with questions.    Total time  spent today in preparation for this visit was *** minutes. This included patient care, imaging and path review, documentation, multidisciplinary discussion and coordination of care and follow up.    Estefana HERO. Maritza, M.D.

## 2024-05-24 ENCOUNTER — Ambulatory Visit
Admission: RE | Admit: 2024-05-24 | Discharge: 2024-05-24 | Disposition: A | Discharge status: Home / Self Care | Source: Ambulatory Visit | Attending: Radiation Oncology | Admitting: Radiation Oncology

## 2024-05-24 ENCOUNTER — Encounter: Payer: Self-pay | Admitting: Radiation Oncology

## 2024-05-24 DIAGNOSIS — D329 Benign neoplasm of meninges, unspecified: Secondary | ICD-10-CM

## 2024-05-27 ENCOUNTER — Other Ambulatory Visit: Payer: Self-pay | Admitting: Radiation Oncology

## 2024-05-27 DIAGNOSIS — D329 Benign neoplasm of meninges, unspecified: Secondary | ICD-10-CM

## 2024-05-29 ENCOUNTER — Inpatient Hospital Stay: Attending: Internal Medicine | Admitting: Internal Medicine

## 2024-05-29 ENCOUNTER — Encounter: Payer: Self-pay | Admitting: Internal Medicine

## 2024-05-29 ENCOUNTER — Other Ambulatory Visit: Payer: Self-pay | Admitting: Radiation Therapy

## 2024-05-29 DIAGNOSIS — Z170 Estrogen receptor positive status [ER+]: Secondary | ICD-10-CM | POA: Diagnosis not present

## 2024-05-29 DIAGNOSIS — Z803 Family history of malignant neoplasm of breast: Secondary | ICD-10-CM | POA: Insufficient documentation

## 2024-05-29 DIAGNOSIS — Z8042 Family history of malignant neoplasm of prostate: Secondary | ICD-10-CM | POA: Insufficient documentation

## 2024-05-29 DIAGNOSIS — D320 Benign neoplasm of cerebral meninges: Secondary | ICD-10-CM | POA: Insufficient documentation

## 2024-05-29 DIAGNOSIS — D472 Monoclonal gammopathy: Secondary | ICD-10-CM | POA: Insufficient documentation

## 2024-05-29 DIAGNOSIS — Z800 Family history of malignant neoplasm of digestive organs: Secondary | ICD-10-CM | POA: Insufficient documentation

## 2024-05-29 DIAGNOSIS — Z923 Personal history of irradiation: Secondary | ICD-10-CM | POA: Diagnosis not present

## 2024-05-29 DIAGNOSIS — Z853 Personal history of malignant neoplasm of breast: Secondary | ICD-10-CM | POA: Insufficient documentation

## 2024-05-29 DIAGNOSIS — C50411 Malignant neoplasm of upper-outer quadrant of right female breast: Secondary | ICD-10-CM

## 2024-05-29 DIAGNOSIS — Z8051 Family history of malignant neoplasm of kidney: Secondary | ICD-10-CM | POA: Diagnosis not present

## 2024-05-29 NOTE — Progress Notes (Signed)
 Dx with brain tumor. MRI brain 04/26/24, Dr. Milissa. Has seen Dr. Janjua in [place], neurosurgeon. She would like to know what you think about the tumor. She hasn't been told if cancer or benign. She has a disk of the MRI.  She had a breath test done by Dr. Valora, but hasn't received results. C/o being tired all the time.

## 2024-05-29 NOTE — Assessment & Plan Note (Addendum)
#   RIGHT INVASIVE LOBULAR CANCER- ER-POSITIVE/PR-NEG; her 2-NEGATIVE;  [s/p lumpectomy; margins 3 mm].  T1b; grade 1.  S/p WBRT [s/p RT may 2nd 2023]. DECLINED any endocrine adjuvant therapy.  Clinically stable. March 2025-Dr.Cintron- Bil Mammo- WNL-   # IgA lambda MGUS [since 2016]- AUG  2023- IgA 1.1- kappa/lamda-N [[facility]];   M protein1.3 ;SEP 2025- M 1.5; K/L=WNL.  Monitor for now.  Again reviewed the low/risk of transformation into multiple myeloma at this time.  Continue stable at this time every 6 months.  Multiple questions answered- stable.    # Brain meningioma: .AUG 29th, 2025-  4.4 x 3.1 x 2.9 cm enhancing mass centered in the right paraclinoid region, with involvement of regional structures, as detailed within the body of the report. Given the eccentric position of this tumor, and given the absence of significant sellar expansion, this is favored to reflect a meningioma.  s/p evaluation with neurosurgery- and Rad-Onc. Awaiting repeating MRI MRI in dec 2025.   # Vaccinations: COVID/RSV-flu/defer to Pharmacy; no contraindications from oncology standpoint.  # DISPOSITION:  # follow-up in 6 month-MD;[facility]- - 2 weeks prior- labs- cbc/cmp;iron studies; ferritin- MM panel; K/L light chain ratio-Dr.B  Cc- Dr.Hedrick-

## 2024-05-29 NOTE — Progress Notes (Signed)
 [facility] OFFICE PROGRESS NOTE  Patient Care Team: Stanton Lynwood FALCON, [name] Rennie Cindy SAUNDERS, MD as Consulting Physician (Oncology) Lenn Aran, MD as Consulting Physician (Radiation Oncology)   SUMMARY OF ONCOLOGIC HISTORY: Breast cancer    Oncology History Overview Note  # JAN 2023- # RIGHT breast [facility] with lobular feathureds ER-POSITIVE/PR-NEG; her 2-2+IHC; FISH-PENIDNG; G-1. JAN 2023- MRI-MRI shows 1.8 cm right upper upper quadrant mass [s/p recent biopsy; Dr.Byrnett].DIAGNOSIS:  A. BREAST, RIGHT UPPER OUTER QUADRANT; EXCISION:  - INVASIVE LOBULAR CARCINOMA.  - CLIP AND BIOPSY SITE IDENTIFIED.  - SEE CANCER SUMMARY.   Comment:  Multiple additional deeper recut levels were examined, due to disruption  of tissue in the initial slides demonstrating tumor. Invasive carcinoma  measures up to 9 mm in greatest linear extent, although there is dense  fibrosis surrounding the invasive carcinoma, which may account for the  discrepancy between the microscopic measurement, and the radiographic  and gross measurements.   B. LYMPH NODES, RIGHT SENTINEL #1, #2, #3, AND #4; EXCISION:  - FOUR LYMPH NODES, NEGATIVE FOR MALIGNANCY (0/4).   Comment:  Immunohistochemical studies directed against cytokeratin AE1/AE3 are  performed on all 4 tissue blocks, and are negative for metastatic  carcinoma   C. BREAST, RIGHT, ADDITIONAL ANTERIOR/INFERIOR MARGIN; EXCISION:  - BENIGN MAMMARY PARENCHYMA WITH FIBROCYSTIC AND APOCRINE CHANGES,  COLUMNAR CELL CHANGE WITHOUT ATYPIA, USUAL DUCTAL HYPERPLASIA, AND FOCAL  SCLEROSING ADENOSIS.  - FOCAL CHANGES SUGGESTIVE OF BENIGN INTRADUCTAL PAPILLOMA.  - NEGATIVE FOR RESIDUAL INVASIVE LOBULAR CARCINOMA AND OTHER ATYPICAL  PROLIFERATIVE BREAST DISEASE.   CANCER CASE SUMMARY: INVASIVE CARCINOMA OF THE BREAST  Standard(s): AJCC-UICC 8   SPECIMEN  Procedure: Excision  Specimen Laterality: Right    TUMOR  Histologic Type: Invasive lobular carcinoma  Histologic Grade (Nottingham Histologic Score)                       Glandular (Acinar)/Tubular Differentiation: 3                       Nuclear Pleomorphism: 1                       Mitotic Rate: 1                       Overall Grade: Grade 1  Tumor Size: 9 mm  Tumor Focality: Single focus of invasive carcinoma  Tumor Extent: Not applicable  Ductal Carcinoma In Situ (DCIS): Not identified  Lymphatic and/or Vascular Invasion: Not identified  Treatment Effect in the Breast: No known presurgical therapy   MARGINS  Margin Status for Invasive Carcinoma: All margins negative for invasive  carcinoma                       Distance from closest margin: 3 mm                       Specify closest margin: Posterior/deep   Margin Status for DCIS: Not applicable (no DCIS in specimen)   #Stage I ER/PR positive HER2 negative breast cancer pT1b grade 1; no Oncotype done.  S/p lumpectomy  #Dec 28, 2021-s/p radiation; May  17th, 2023-declined AI    July 2016-M-protein-IgA Lamda- MGUS: NOV 2016- M spike- 1gm/dl; IgA- 1898 [total]; K/L=N; AUG 2016- Skeletal survey-Normal;  # Stroke- ? [Dr.Shah; 2018]   Carcinoma  of upper-outer quadrant of right breast in female, estrogen receptor positive (HCC)  10/01/2021 Initial Diagnosis   Carcinoma of upper-outer quadrant of right breast in female, estrogen receptor positive (HCC)   11/10/2021 Cancer Staging   Staging form: Breast, AJCC 8th Edition - Clinical: Stage IA (cT1b, cN0, cM0, G1, ER+, PR-, HER2-) - Signed by Rennie Cindy SAUNDERS, MD on 11/10/2021 Histologic grading system: 3 grade system    INTERVAL HISTORY: Alone.  Ambulating independently.  83 year old female patient with diagnosis of stage I breast cancer lobular ER positive PR negative HER2 negative s/p lumpectomy s/p RT; declined adjuvant AI is  here for follow-up; is here to review the results of MGUS work up.  Patient noted to have  symptoms of vertigo/ right ear fullness which led to further work up with ENT/Neurosurgery. Patient recently diagnosed with  large midline mass suspected to be a meningioma.   Denies any new shortness of breath or cough.  Denies any new symptoms.   Review of Systems  Constitutional:  Positive for malaise/fatigue. Negative for chills, diaphoresis, fever and weight loss.  HENT:  Negative for nosebleeds and sore throat.   Eyes:  Negative for double vision.  Respiratory:  Negative for cough, hemoptysis, sputum production, shortness of breath and wheezing.   Cardiovascular:  Negative for chest pain, palpitations, orthopnea and leg swelling.  Gastrointestinal:  Negative for abdominal pain, blood in stool, constipation, diarrhea, heartburn, melena, nausea and vomiting.  Genitourinary:  Negative for dysuria, frequency and urgency.  Musculoskeletal:  Positive for back pain and joint pain.  Skin: Negative.  Negative for itching and rash.  Neurological:  Negative for dizziness, tingling, focal weakness, weakness and headaches.  Endo/Heme/Allergies:  Does not bruise/bleed easily.  Psychiatric/Behavioral:  Negative for depression. The patient is not nervous/anxious and does not have insomnia.      PAST MEDICAL HISTORY :  Past Medical History:  Diagnosis Date   Arthritis    Breast cancer (HCC)    Bronchitis    Cancer (HCC)    basal cell   Change in bowel habits    Colon polyps    Femur fracture, left (HCC)    Hepatitis    as a child   Hyperlipidemia    Hypertension    controlled with medication;    Hypothyroidism    h/o   MGUS (monoclonal gammopathy of unknown significance)    Osteoporosis    hasn't been checked in a while; unsure how severe;    Personal history of radiation therapy    Postmenopausal    Rectal bleeding    Seasonal allergies    Sleep apnea    does not use cpap   Stroke [facility]) ??   pt unaware but was told she had stroke possibly after fall in 2016-has occ balance  issues   Vitamin D deficiency     PAST SURGICAL HISTORY :   Past Surgical History:  Procedure Laterality Date   AXILLARY LYMPH NODE BIOPSY Right 11/01/2021   Procedure: AXILLARY LYMPH NODE BIOPSY;  Surgeon: Dessa Reyes ORN, MD;  Location: [facility] ORS;  Service: General;  Laterality: Right;   BREAST BIOPSY Left 09/30/2008   Patient presented with acute swelling in the breast, aspiration culture negative.  Subsequent core biopsy showing fragments of cyst wall with granulation tissue, fibrosis and focal residual epithelial lining.  No malignancy.   BREAST BIOPSY Right 09/22/2021   ULTRASOUND-GUIDED BIOPSY: - INVASIVE MAMMARY CARCINOMA WITH LOBULAR FEATURES   BREAST CYST ASPIRATION     BREAST  LUMPECTOMY Right 11/01/2021   BREAST LUMPECTOMY WITH NEEDLE LOCALIZATION Right 11/01/2021   Procedure: BREAST LUMPECTOMY WITH NEEDLE LOCALIZATION;  Surgeon: Dessa Reyes ORN, MD;  Location: [facility];  Service: General;  Laterality: Right;   BREAST SURGERY Right 11/01/2021   wire loc w/ sn for lumpectomy   CATARACT EXTRACTION W/PHACO Left 03/02/2021   Procedure: CATARACT EXTRACTION PHACO AND INTRAOCULAR LENS PLACEMENT (IOC) LEFT 6.40 00:35.2;  Surgeon: Jaye Fallow, MD;  Location: [facility];  Service: Ophthalmology;  Laterality: Left;   CATARACT EXTRACTION W/PHACO Right 03/16/2021   Procedure: CATARACT EXTRACTION PHACO AND INTRAOCULAR LENS PLACEMENT (IOC) RIGHT;  Surgeon: Jaye Fallow, MD;  Location: [facility];  Service: Ophthalmology;  Laterality: Right;  7.42 0:51.0   COLONOSCOPY WITH PROPOFOL  N/A 02/05/2015   Procedure: COLONOSCOPY WITH PROPOFOL ;  Surgeon: Lamar ONEIDA Holmes, MD;  Location: [facility];  Service: Endoscopy;  Laterality: N/A;   COLONOSCOPY WITH PROPOFOL  N/A 04/08/2020   Procedure: COLONOSCOPY WITH PROPOFOL ;  Surgeon: Toledo, Ladell POUR, MD;  Location: [facility] ENDOSCOPY;  Service: Gastroenterology;  Laterality: N/A;   COLONOSCOPY WITH PROPOFOL  N/A 12/20/2022    Procedure: COLONOSCOPY WITH PROPOFOL ;  Surgeon: Maryruth Ole ONEIDA, MD;  Location: [facility] ENDOSCOPY;  Service: Endoscopy;  Laterality: N/A;   EYE SURGERY     FEMUR FRACTURE SURGERY Left 04/18/2015   FRACTURE SURGERY     TONSILLECTOMY      FAMILY HISTORY :   Family History  Problem Relation Age of Onset   Breast cancer Cousin        paternal   Kidney cancer Cousin        maternal   Leukemia Cousin        maternal   Stomach cancer Other        uncle   Prostate cancer Other        grandfather   Skin cancer Other        maternal grandparents    SOCIAL HISTORY:   Social History   Tobacco Use   Smoking status: Never   Smokeless tobacco: Never  Vaping Use   Vaping status: Never Used  Substance Use Topics   Alcohol use: No   Drug use: No    ALLERGIES:  is allergic to mold extract [trichophyton], codeine, indomethacin, other, sulfa antibiotics, cashew nut (anacardium occidentale) skin test, nickel, and sulfamethoxazole-trimethoprim.  MEDICATIONS:  Current Outpatient Medications  Medication Sig Dispense Refill   aspirin 81 MG EC tablet Take 81 mg by mouth daily.     bisoprolol-hydrochlorothiazide (ZIAC) 2.5-6.25 MG tablet Take 1 tablet by mouth daily.     L-Lysine 500 MG TABS Take 600 mg by mouth daily.     No current facility-administered medications for this visit.    PHYSICAL EXAMINATION:   BP (!) 142/61 (BP Location: Left Arm, Patient Position: Sitting, Cuff Size: Normal)   Pulse (!) 53   Temp 97.8 F (36.6 C) (Tympanic)   Resp 12   Ht 5' 2 (1.575 m)   Wt 154 lb 4.8 oz (70 kg)   SpO2 100%   BMI 28.22 kg/m   Filed Weights   05/29/24 1354  Weight: 154 lb 4.8 oz (70 kg)     Physical Exam HENT:     Head: Normocephalic and atraumatic.     Mouth/Throat:     Pharynx: No oropharyngeal exudate.  Eyes:     Pupils: Pupils are equal, round, and reactive to light.  Cardiovascular:     Rate and Rhythm: Normal rate and regular  rhythm.  Pulmonary:      Effort: No respiratory distress.     Breath sounds: No wheezing.  Abdominal:     General: Bowel sounds are normal. There is no distension.     Palpations: Abdomen is soft. There is no mass.     Tenderness: There is no abdominal tenderness. There is no guarding or rebound.  Musculoskeletal:        General: No tenderness. Normal range of motion.     Cervical back: Normal range of motion and neck supple.  Skin:    General: Skin is warm.  Neurological:     Mental Status: She is alert and oriented to person, place, and time.  Psychiatric:        Mood and Affect: Affect normal.      LABORATORY DATA:  I have reviewed the data as listed    Component Value Date/Time   NA 140 09/05/2018 0730   NA 141 09/29/2011 1424   K 4.1 10/26/2021 1355   K 3.9 09/29/2011 1424   CL 107 09/05/2018 0730   CL 100 09/29/2011 1424   CO2 25 09/05/2018 0730   CO2 25 09/29/2011 1424   GLUCOSE 125 (H) 09/05/2018 0730   GLUCOSE 172 (H) 09/29/2011 1424   BUN 23 09/05/2018 0730   BUN 19 (H) 09/29/2011 1424   CREATININE 0.80 03/30/2022 1413   CREATININE 0.79 09/29/2011 1424   CALCIUM 9.0 09/05/2018 0730   CALCIUM 10.1 09/29/2011 1424   PROT 7.3 09/05/2018 0730   PROT 8.5 (H) 09/29/2011 1424   ALBUMIN 3.5 09/05/2018 0730   ALBUMIN 4.1 09/29/2011 1424   AST 30 09/05/2018 0730   AST 24 09/29/2011 1424   ALT 22 09/05/2018 0730   ALT 12 09/29/2011 1424   ALKPHOS 83 09/05/2018 0730   ALKPHOS 76 09/29/2011 1424   BILITOT 0.8 09/05/2018 0730   BILITOT 0.8 09/29/2011 1424   GFRNONAA >60 09/05/2018 0730   GFRNONAA >60 09/29/2011 1424   GFRAA >60 09/05/2018 0730   GFRAA >60 09/29/2011 1424    No results found for: SPEP, UPEP  Lab Results  Component Value Date   WBC 6.5 09/05/2018   NEUTROABS 4.2 09/05/2018   HGB 10.9 (L) 09/05/2018   HCT 34.1 (L) 09/05/2018   MCV 89.3 09/05/2018   PLT 242 09/05/2018      Chemistry      Component Value Date/Time   NA 140 09/05/2018 0730   NA 141  09/29/2011 1424   K 4.1 10/26/2021 1355   K 3.9 09/29/2011 1424   CL 107 09/05/2018 0730   CL 100 09/29/2011 1424   CO2 25 09/05/2018 0730   CO2 25 09/29/2011 1424   BUN 23 09/05/2018 0730   BUN 19 (H) 09/29/2011 1424   CREATININE 0.80 03/30/2022 1413   CREATININE 0.79 09/29/2011 1424      Component Value Date/Time   CALCIUM 9.0 09/05/2018 0730   CALCIUM 10.1 09/29/2011 1424   ALKPHOS 83 09/05/2018 0730   ALKPHOS 76 09/29/2011 1424   AST 30 09/05/2018 0730   AST 24 09/29/2011 1424   ALT 22 09/05/2018 0730   ALT 12 09/29/2011 1424   BILITOT 0.8 09/05/2018 0730   BILITOT 0.8 09/29/2011 1424        ASSESSMENT & PLAN:   Carcinoma of upper-outer quadrant of right breast in female, estrogen receptor positive (HCC) # RIGHT INVASIVE LOBULAR CANCER- ER-POSITIVE/PR-NEG; her 2-NEGATIVE;  [s/p lumpectomy; margins 3 mm].  T1b; grade 1.  S/p WBRT [  s/p RT may 2nd 2023]. DECLINED any endocrine adjuvant therapy.  Clinically stable. March 2025-Dr.Cintron- Bil Mammo- WNL-   # IgA lambda MGUS [since 2016]- AUG  2023- IgA 1.1- kappa/lamda-N [[facility]];   M protein1.3 ;SEP 2025- M 1.5; K/L=WNL.  Monitor for now.  Again reviewed the low/risk of transformation into multiple myeloma at this time.  Continue stable at this time every 6 months.  Multiple questions answered- stable.    # Brain meningioma: .AUG 29th, 2025-  4.4 x 3.1 x 2.9 cm enhancing mass centered in the right paraclinoid region, with involvement of regional structures, as detailed within the body of the report. Given the eccentric position of this tumor, and given the absence of significant sellar expansion, this is favored to reflect a meningioma.  s/p evaluation with neurosurgery- and Rad-Onc. Awaiting repeating MRI MRI in dec 2025.   # Vaccinations: COVID/RSV-flu/defer to Pharmacy; no contraindications from oncology standpoint.  # DISPOSITION:  # follow-up in 6 month-MD;[facility]- - 2 weeks prior- labs- cbc/cmp;iron studies; ferritin-  MM panel; K/L light chain ratio-Dr.B  Cc- Dr.Hedrick-      Cindy JONELLE Joe, MD 05/29/2024 3:57 PM

## 2024-08-07 ENCOUNTER — Inpatient Hospital Stay: Admission: RE | Admit: 2024-08-07 | Discharge: 2024-08-07 | Attending: Radiation Oncology

## 2024-08-07 DIAGNOSIS — D329 Benign neoplasm of meninges, unspecified: Secondary | ICD-10-CM

## 2024-08-07 MED ORDER — GADOPICLENOL 0.5 MMOL/ML IV SOLN
7.5000 mL | Freq: Once | INTRAVENOUS | Status: AC | PRN
  Administered 2024-08-07: 13:00:00 7 mL via INTRAVENOUS

## 2024-08-12 ENCOUNTER — Inpatient Hospital Stay: Attending: Internal Medicine

## 2024-08-13 ENCOUNTER — Encounter: Payer: Self-pay | Admitting: Neurosurgery

## 2024-08-13 ENCOUNTER — Ambulatory Visit: Admitting: Neurosurgery

## 2024-08-13 VITALS — BP 149/80 | HR 54 | Temp 97.5°F | Ht 62.0 in | Wt 154.0 lb

## 2024-08-13 DIAGNOSIS — D330 Benign neoplasm of brain, supratentorial: Secondary | ICD-10-CM

## 2024-08-13 NOTE — Progress Notes (Signed)
 I am really happy to see how well she is doing.  Reviewed the results of the MRI with her and her family and told them that there has been no change.  We talked about the course of events where she did not have this in 2018 and does.  Therefore, it has grown over the past 7 years but we do not know over what timeline.  I again went over the options of doing nothing versus radiosurgery versus surgery and I told her that I do not recommend a surgical resection.  We talked about the timeline and given the fact that there has been no radiographic change nor a change in the clinical condition, I recommend that we get a repeat MRI in 3 or 6 months.  She wants to have it done in 3 months and we decided that if anything changes in the interim, she will give me a call.

## 2024-08-13 NOTE — Progress Notes (Signed)
 83 year old female presenting to the hospital today for followup on on cranial tumor.   In 2018 MR head for stroke revealed no tumor  Breast MRI 2023 found a 1.8cm malignancy, Right breast anterior/inferior margin excision MRI head 04/26/24 obtained due to dizziness, vision problems, weakness, and history of breast cancer. found 4.4 x 3.1 x 2.9 cm enhancing mass centered in the right paraclinoid region, with involvement of regional structures, Favored to reflect a meningioma or Pituitary macroadenoma MRI 08/07/24 unchanged   The last time she presented to the clinic 05/17/24 Dr.Janjua spoke with her about some potential options:  1) Do nothing.  2) Radiation therapy 3) Surgical resection. Which was not recommended due to the risks outweighing the benefits for her age and condition  Today she presents to the clinic following the unchanged MRI to devise a plan with Dr.Janjua. She reports a little dizziness, general fatigue, and that she is not sleeping all too well due to the stress. Her son also reports increased crankiness with her husband who stated I can take it. She reports that this is consistent with her last exam and denies any new symptoms or changes.   The patient and family appear to feel stuck between a rock and a hard place. They have expressed that they 100% do not want to do surgery. They are more willing to pursue radiation but only if it is deemed absolutely necessary by the doctor. Preferably, they would like to watch and wait; however, are nervous about the tumor continuing to grow during that time. They are unsure about what they should do next and the patient seems understandably frozen in place in her decision making due to the stress and anxiety of the situation

## 2024-10-02 ENCOUNTER — Other Ambulatory Visit: Payer: Self-pay | Admitting: General Surgery

## 2024-10-02 DIAGNOSIS — Z1231 Encounter for screening mammogram for malignant neoplasm of breast: Secondary | ICD-10-CM

## 2024-11-11 ENCOUNTER — Encounter

## 2024-11-12 ENCOUNTER — Ambulatory Visit: Admitting: Neurosurgery

## 2024-11-27 ENCOUNTER — Ambulatory Visit: Admitting: Internal Medicine
